# Patient Record
Sex: Female | Born: 1945 | Race: White | Hispanic: No | Marital: Married | Smoking: Never smoker
Health system: Southern US, Community
[De-identification: ages and names within clinical notes are randomized; demographics above are authoritative.]

## PROBLEM LIST (undated history)

## (undated) DIAGNOSIS — IMO0002 Reserved for concepts with insufficient information to code with codable children: Secondary | ICD-10-CM

## (undated) DIAGNOSIS — I10 Essential (primary) hypertension: Secondary | ICD-10-CM

## (undated) DIAGNOSIS — F329 Major depressive disorder, single episode, unspecified: Secondary | ICD-10-CM

## (undated) DIAGNOSIS — R87620 Atypical squamous cells of undetermined significance on cytologic smear of vagina (ASC-US): Secondary | ICD-10-CM

## (undated) DIAGNOSIS — R87629 Unspecified abnormal cytological findings in specimens from vagina: Secondary | ICD-10-CM

## (undated) DIAGNOSIS — R87619 Unspecified abnormal cytological findings in specimens from cervix uteri: Secondary | ICD-10-CM

## (undated) DIAGNOSIS — F419 Anxiety disorder, unspecified: Secondary | ICD-10-CM

## (undated) DIAGNOSIS — N189 Chronic kidney disease, unspecified: Secondary | ICD-10-CM

## (undated) DIAGNOSIS — F32A Depression, unspecified: Secondary | ICD-10-CM

## (undated) DIAGNOSIS — R030 Elevated blood-pressure reading, without diagnosis of hypertension: Secondary | ICD-10-CM

## (undated) HISTORY — DX: Atypical squamous cells of undetermined significance on cytologic smear of vagina (ASC-US): R87.620

## (undated) HISTORY — DX: Anxiety disorder, unspecified: F41.9

## (undated) HISTORY — DX: Reserved for concepts with insufficient information to code with codable children: IMO0002

## (undated) HISTORY — PX: ABDOMINAL HYSTERECTOMY: SHX81

## (undated) HISTORY — DX: Unspecified abnormal cytological findings in specimens from cervix uteri: R87.619

## (undated) HISTORY — PX: TUBAL LIGATION: SHX77

## (undated) HISTORY — DX: Unspecified abnormal cytological findings in specimens from vagina: R87.629

## (undated) HISTORY — DX: Elevated blood-pressure reading, without diagnosis of hypertension: R03.0

## (undated) HISTORY — DX: Major depressive disorder, single episode, unspecified: F32.9

## (undated) HISTORY — DX: Depression, unspecified: F32.A

## (undated) HISTORY — DX: Chronic kidney disease, unspecified: N18.9

---

## 2010-11-08 ENCOUNTER — Ambulatory Visit: Payer: Self-pay | Admitting: Family Medicine

## 2010-12-20 ENCOUNTER — Other Ambulatory Visit (HOSPITAL_COMMUNITY)
Admission: RE | Admit: 2010-12-20 | Discharge: 2010-12-20 | Disposition: A | Discharge status: Home / Self Care | Payer: PRIVATE HEALTH INSURANCE | Source: Ambulatory Visit | Attending: Family Medicine | Admitting: Family Medicine

## 2010-12-20 ENCOUNTER — Encounter: Payer: Self-pay | Admitting: Family Medicine

## 2010-12-20 ENCOUNTER — Ambulatory Visit (INDEPENDENT_AMBULATORY_CARE_PROVIDER_SITE_OTHER): Payer: PRIVATE HEALTH INSURANCE | Admitting: Family Medicine

## 2010-12-20 DIAGNOSIS — R87620 Atypical squamous cells of undetermined significance on cytologic smear of vagina (ASC-US): Secondary | ICD-10-CM

## 2010-12-20 DIAGNOSIS — Z0000 Encounter for general adult medical examination without abnormal findings: Secondary | ICD-10-CM

## 2010-12-20 DIAGNOSIS — Z01419 Encounter for gynecological examination (general) (routine) without abnormal findings: Secondary | ICD-10-CM | POA: Insufficient documentation

## 2010-12-20 DIAGNOSIS — Z136 Encounter for screening for cardiovascular disorders: Secondary | ICD-10-CM

## 2010-12-20 DIAGNOSIS — R87810 Cervical high risk human papillomavirus (HPV) DNA test positive: Secondary | ICD-10-CM | POA: Insufficient documentation

## 2010-12-20 DIAGNOSIS — R030 Elevated blood-pressure reading, without diagnosis of hypertension: Secondary | ICD-10-CM

## 2010-12-20 DIAGNOSIS — Z124 Encounter for screening for malignant neoplasm of cervix: Secondary | ICD-10-CM

## 2010-12-20 DIAGNOSIS — Z23 Encounter for immunization: Secondary | ICD-10-CM

## 2010-12-20 DIAGNOSIS — Z1231 Encounter for screening mammogram for malignant neoplasm of breast: Secondary | ICD-10-CM

## 2010-12-20 HISTORY — DX: Atypical squamous cells of undetermined significance on cytologic smear of vagina (ASC-US): R87.620

## 2010-12-20 LAB — BASIC METABOLIC PANEL
CO2: 27 mEq/L (ref 19–32)
Chloride: 110 mEq/L (ref 96–112)
Creatinine, Ser: 0.8 mg/dL (ref 0.4–1.2)
Sodium: 143 mEq/L (ref 135–145)

## 2010-12-20 LAB — LIPID PANEL
Cholesterol: 178 mg/dL (ref 0–200)
HDL: 42.6 mg/dL (ref >39.00)
LDL Cholesterol: 118 mg/dL — ABNORMAL HIGH (ref 0–99)
Triglycerides: 86 mg/dL (ref 0.0–149.0)

## 2010-12-20 NOTE — Progress Notes (Signed)
Subjective:    Patient ID: Amanda Hancock, female    DOB: March 16, 1946, 65 y.o.   MRN: [id number]  HPI  65 yo here to establish care and for welcome to medicare physical.  I have personally reviewed the Medicare Annual Wellness questionnaire and have noted 1. The patient's medical and social history 2. Their use of alcohol, tobacco or illicit drugs 3. Their current medications and supplements 4. The patient's functional ability including ADL's, fall risks, home safety risks and hearing or visual             impairment. 5. Diet and physical activities 6. Evidence for depression or mood disorders  Elevated blood pressure- has had elevated readings in past. Was previously on diltiazem.  No CP, SOB.  Patient Active Problem List  Diagnoses  . Elevated blood pressure (not hypertension)   Past Medical History  Diagnosis Date  . Elevated blood pressure (not hypertension)    Past Surgical History  Procedure Date  . Abdominal hysterectomy    History  Substance Use Topics  . Smoking status: Never Smoker   . Smokeless tobacco: Not on file  . Alcohol Use: Not on file   Family History  Problem Relation Age of Onset  . Parkinsonism Mother   . Dementia Mother    Allergies  Allergen Reactions  . Codeine Nausea And Vomiting and Other (See Comments)    headache   Current outpatient prescriptions:Naproxen Sodium (ALEVE) 220 MG CAPS, Take 1 capsule by mouth as needed.  , Disp: , Rfl:    Review of Systems See HPI   Objective:   Physical Exam BP 150/80  Pulse 70  Temp(Src) 98.4 F (36.9 C) (Oral)  Ht 5' 1.25" (1.556 m)  Wt 196 lb 4 oz (89.018 kg)  BMI 36.78 kg/m2  General:  Well-developed,well-nourished,in no acute distress; alert,appropriate and cooperative throughout examination Head:  normocephalic and atraumatic.   Eyes:  vision grossly intact, pupils equal, pupils round, and pupils reactive to light.   Ears:  R ear normal and L ear normal.   Nose:  no external  deformity.   Mouth:  good dentition.   Neck:  No deformities, masses, or tenderness noted. Breasts:  No mass, nodules, thickening, tenderness, bulging, retraction, inflamation, nipple discharge or skin changes noted.   Lungs:  Normal respiratory effort, chest expands symmetrically. Lungs are clear to auscultation, no crackles or wheezes. Heart:  Normal rate and regular rhythm. S1 and S2 normal without gallop, murmur, click, rub or other extra sounds. Abdomen:  Bowel sounds positive,abdomen soft and non-tender without masses, organomegaly or hernias noted. Rectal:  no external abnormalities.   Genitalia:  Pelvic Exam:        External: multiple fluid bumps on labia majora bilaterally- per pt have been there for as long as she can remember        Vagina: normal without lesions or masses        Cervix: absent        Adnexa: normal bimanual exam without masses or fullness        Uterus: absent        Pap smear: performed Msk:  No deformity or scoliosis noted of thoracic or lumbar spine.   Extremities:  No clubbing, cyanosis, edema, or deformity noted with normal full range of motion of all joints.   Neurologic:  alert & oriented X3 and gait normal.   Skin:  Intact without suspicious lesions or rashes Cervical Nodes:  No lymphadenopathy noted Axillary Nodes:  No palpable lymphadenopathy Psych:  Cognition and judgment appear intact. Alert and cooperative with normal attention span and concentration. No apparent delusions, illusions, hallucinations     EKG- probable normal variant, NSR with borderline short PR interval Assessment & Plan:   1. Elevated blood pressure (not hypertension)   Deteriorated. Asymptomatic. Advised to follow up in 1-2 weeks to recheck her blood pressure.   2. Routine general medical examination at a health care facility   The patients weight, height, BMI and visual acuity have been recorded in the chart I have made referrals, counseling and provided education to  the patient based review of the above and I have provided the pt with a written personalized care plan for preventive services.  IFOB, Mammogram ordered today. Zostavax and Pneumovax given.

## 2010-12-20 NOTE — Progress Notes (Signed)
Addended by: Gilmer Mor on: 12/20/2010 12:10 PM   Modules accepted: Orders

## 2010-12-20 NOTE — Patient Instructions (Signed)
Nice to meet you. Please stop by to see Shirlee Limerick on your way out. I would like for you to come in 1-2 weeks to recheck you blood pressure.

## 2010-12-26 ENCOUNTER — Other Ambulatory Visit: Payer: Self-pay | Admitting: Family Medicine

## 2011-01-03 ENCOUNTER — Encounter: Payer: Self-pay | Admitting: Family Medicine

## 2011-01-03 ENCOUNTER — Ambulatory Visit (INDEPENDENT_AMBULATORY_CARE_PROVIDER_SITE_OTHER): Payer: PRIVATE HEALTH INSURANCE | Admitting: Family Medicine

## 2011-01-03 VITALS — BP 158/76 | HR 76 | Temp 98.1°F | Ht 61.25 in | Wt 197.5 lb

## 2011-01-03 DIAGNOSIS — R030 Elevated blood-pressure reading, without diagnosis of hypertension: Secondary | ICD-10-CM

## 2011-01-03 MED ORDER — HYDROCHLOROTHIAZIDE 12.5 MG PO CAPS: 12.5000 mg | ORAL_CAPSULE | Freq: Every day | ORAL | Status: DC

## 2011-01-03 NOTE — Patient Instructions (Signed)
Great to see you. Please make an appointment in 2-4 weeks. Once you buy a home blood pressure machine, please check your blood pressure daily and keep a record for me.

## 2011-01-03 NOTE — Progress Notes (Signed)
  Subjective:    Patient ID: Amanda Hancock, female    DOB: 1945/10/15, 65 y.o.   MRN: [id number]  HPI  65 yo here for follow up elevated BP.  BP Readings from Last 3 Encounters:  01/03/11 158/76  12/20/10 150/80   Was previously on diltiazem.  No CP, SOB. Known know allergies to antihypertensives.  Lab Results  Component Value Date   CREATININE 0.8 12/20/2010     Patient Active Problem List  Diagnoses  . Elevated blood pressure (not hypertension)  . Routine general medical examination at a health care facility   Past Medical History  Diagnosis Date  . Elevated blood pressure (not hypertension)    Past Surgical History  Procedure Date  . Abdominal hysterectomy    History  Substance Use Topics  . Smoking status: Never Smoker   . Smokeless tobacco: Not on file  . Alcohol Use: Not on file   Family History  Problem Relation Age of Onset  . Parkinsonism Mother   . Dementia Mother    Allergies  Allergen Reactions  . Codeine Nausea And Vomiting and Other (See Comments)    headache   Current outpatient prescriptions:Naproxen Sodium (ALEVE) 220 MG CAPS, Take 1 capsule by mouth as needed.  , Disp: , Rfl:    Review of Systems See HPI   Objective:   Physical Exam BP 158/76  Pulse 76  Temp(Src) 98.1 F (36.7 C) (Oral)  Ht 5' 1.25" (1.556 m)  Wt 197 lb 8 oz (89.585 kg)  BMI 37.01 kg/m2 General:  Well-developed,well-nourished,in no acute distress; alert,appropriate and cooperative throughout examination Head:  normocephalic and atraumatic.   Eyes:  vision grossly intact, pupils equal, pupils round, and pupils reactive to light.   Ears:  R ear normal and L ear normal.   Nose:  no external deformity.   Mouth:  good dentition.   Lungs:  Normal respiratory effort, chest expands symmetrically. Lungs are clear to auscultation, no crackles or wheezes. Heart:  Normal rate and regular rhythm. S1 and S2 normal without gallop, murmur, click, rub or other extra  sounds. Msk:  No deformity or scoliosis noted of thoracic or lumbar spine.   Extremities:  No clubbing, cyanosis, edema, or deformity noted with normal full range of motion of all joints.   Neurologic:  alert & oriented X3 and gait normal.   Skin:  Intact without suspicious lesions or rashes Cervical Nodes:  No lymphadenopathy noted Axillary Nodes:  No palpable lymphadenopathy Psych:  Cognition and judgment appear intact. Alert and cooperative with normal attention span and concentration. No apparent delusions, illusions, hallucinations     Assessment & Plan:   1. Elevated blood pressure (not hypertension)   Deteriorated. Asymptomatic but endorses increased stressors at home. Will start low dose HCTZ 12.5 mg daily. Follow up in 2-4 weeks. Encouraged to buy a home cuff. The patient indicates understanding of these issues and agrees with the plan.  Advised to follow up in 1-2 weeks to recheck her blood pressure.

## 2011-01-07 ENCOUNTER — Encounter: Payer: Self-pay | Admitting: Obstetrics & Gynecology

## 2011-01-07 ENCOUNTER — Ambulatory Visit (INDEPENDENT_AMBULATORY_CARE_PROVIDER_SITE_OTHER): Payer: PRIVATE HEALTH INSURANCE | Admitting: Obstetrics & Gynecology

## 2011-01-07 VITALS — BP 156/82 | HR 69 | Ht 61.26 in | Wt 192.0 lb

## 2011-01-07 DIAGNOSIS — R87620 Atypical squamous cells of undetermined significance on cytologic smear of vagina (ASC-US): Secondary | ICD-10-CM

## 2011-01-07 NOTE — Progress Notes (Signed)
  Colposcopy Procedure Note  Indications: Pap smear done on 12/20/10 showed: ASCUS with POSITIVE high risk HPV.  Prior cervical/vaginal disease: patient reports having abnormal pap smears several years ago, prior to having a TAH.  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 Prior cervical treatment: no treatment.  Procedure Details  The risks and benefits of the procedure and Written informed consent obtained.  Speculum placed in vagina and excellent visualization of vaginal cuff achieved, vaginal cuff swabbed x 3 with Lugol's solution.  Findings: Cervix: Absent Vaginal inspection: lesion(s) noted at 6 o'clock and biopsies taken at 6 o'clock. Vulvar colposcopy: vulvar colposcopy not performed.  Specimens: Vaginal cuff biopsy  Complications: none.  Plan: Specimens labelled and sent to Pathology. Will base further treatment on Pathology findings. Post biopsy instructions given to patient. Return to discuss Pathology results in 2 weeks;it is likely benign.  Will probably need vaginal cuff pap smears every 6 months until she has two consecutive normal pap smears, then she can resume annual screening.

## 2011-01-07 NOTE — Patient Instructions (Signed)

## 2011-01-08 ENCOUNTER — Ambulatory Visit: Payer: Self-pay | Admitting: Family Medicine

## 2011-01-10 ENCOUNTER — Ambulatory Visit (INDEPENDENT_AMBULATORY_CARE_PROVIDER_SITE_OTHER): Payer: PRIVATE HEALTH INSURANCE | Admitting: Family Medicine

## 2011-01-10 ENCOUNTER — Encounter: Payer: Self-pay | Admitting: Family Medicine

## 2011-01-10 DIAGNOSIS — J069 Acute upper respiratory infection, unspecified: Secondary | ICD-10-CM

## 2011-01-10 MED ORDER — AMOXICILLIN 500 MG PO CAPS: 500.0000 mg | ORAL_CAPSULE | Freq: Two times a day (BID) | ORAL | Status: AC

## 2011-01-10 NOTE — Patient Instructions (Signed)
Take antibiotic as directed.  Drink lots of fluids.  Treat sympotmatically with Mucinex, nasal saline irrigation, and Tylenol/Ibuprofen.Call if not improving as expected in 5-7 days.    

## 2011-01-10 NOTE — Progress Notes (Signed)
SUBJECTIVE:  Amanda Hancock is a 65 y.o. female who complains of coryza, congestion, sneezing, sore throat and swollen glands for 12 days. She denies a history of anorexia, chest pain, dizziness, fatigue, fevers and myalgias and denies a history of asthma. Patient denies smoke cigarettes.   Patient Active Problem List  Diagnoses  . Elevated blood pressure (not hypertension)  . Routine general medical examination at a health care facility  . Vaginal Pap smear with ASC-US and positive HPV   Past Medical History  Diagnosis Date  . Elevated blood pressure (not hypertension)   . Abnormal Pap smear     ASCUS  . Chronic kidney disease     KIDNEYSTONES  . Vaginal Pap smear with ASC-US and positive HPV 12/20/2010   Past Surgical History  Procedure Date  . Abdominal hysterectomy   . Tubal ligation    History  Substance Use Topics  . Smoking status: Never Smoker   . Smokeless tobacco: Not on file  . Alcohol Use: Not on file   Family History  Problem Relation Age of Onset  . Parkinsonism Mother   . Dementia Mother   . Heart attack Paternal Grandfather   . Diabetes Maternal Grandmother    Allergies  Allergen Reactions  . Codeine Nausea And Vomiting and Other (See Comments)    headache   Current Outpatient Prescriptions on File Prior to Visit  Medication Sig Dispense Refill  . hydrochlorothiazide (MICROZIDE) 12.5 MG capsule Take 1 capsule (12.5 mg total) by mouth daily.  30 capsule  1  . Naproxen Sodium (ALEVE) 220 MG CAPS Take 1 capsule by mouth as needed.         The PMH, PSH, Social History, Family History, Medications, and allergies have been reviewed in [facility], and have been updated if relevant.  OBJECTIVE: BP 122/72  Pulse 74  Temp(Src) 98.3 F (36.8 C) (Oral)  Wt 192 lb (87.091 kg)  She appears well, vital signs are as noted. Ears normal.  Throat and pharynx normal.  Neck supple. No adenopathy in the neck. Nose is congested. Sinuses non tender. The chest is clear, without  wheezes or rales.  ASSESSMENT:  sinusitis  PLAN: Given duration and progression of symptoms, will treat for bacterial sinusitis with amoxicillin. Symptomatic therapy suggested: push fluids, rest and return office visit prn if symptoms persist or worsen. Call or return to clinic prn if these symptoms worsen or fail to improve as anticipated.

## 2011-01-28 ENCOUNTER — Encounter: Payer: Self-pay | Admitting: Family Medicine

## 2011-01-28 ENCOUNTER — Ambulatory Visit (INDEPENDENT_AMBULATORY_CARE_PROVIDER_SITE_OTHER): Payer: PRIVATE HEALTH INSURANCE | Admitting: Family Medicine

## 2011-01-28 VITALS — BP 132/80 | HR 60 | Temp 98.6°F | Ht 61.25 in | Wt 195.5 lb

## 2011-01-28 DIAGNOSIS — I10 Essential (primary) hypertension: Secondary | ICD-10-CM | POA: Insufficient documentation

## 2011-01-28 NOTE — Progress Notes (Signed)
  Subjective:    Patient ID: Amanda Hancock, female    DOB: Jun 18, 1945, 65 y.o.   MRN: [id number]  HPI  65 yo here for follow HTN. Started on HCTZ 12.5 mg daily last month.  BP Readings from Last 3 Encounters:  01/28/11 132/80  01/10/11 122/72  01/07/11 156/82   Feels much better. No HA, blurred vision, SOB, LE edema or dizziness.   Lab Results  Component Value Date   CREATININE 0.8 12/20/2010     Patient Active Problem List  Diagnoses  . Elevated blood pressure (not hypertension)  . Routine general medical examination at a health care facility  . Vaginal Pap smear with ASC-US and positive HPV   Past Medical History  Diagnosis Date  . Elevated blood pressure (not hypertension)   . Abnormal Pap smear     ASCUS  . Chronic kidney disease     KIDNEYSTONES  . Vaginal Pap smear with ASC-US and positive HPV 12/20/2010   Past Surgical History  Procedure Date  . Abdominal hysterectomy   . Tubal ligation    History  Substance Use Topics  . Smoking status: Never Smoker   . Smokeless tobacco: Not on file  . Alcohol Use: Not on file   Family History  Problem Relation Age of Onset  . Parkinsonism Mother   . Dementia Mother   . Heart attack Paternal Grandfather   . Diabetes Maternal Grandmother    Allergies  Allergen Reactions  . Codeine Nausea And Vomiting and Other (See Comments)    headache   Current outpatient prescriptions:hydrochlorothiazide (MICROZIDE) 12.5 MG capsule, Take 1 capsule (12.5 mg total) by mouth daily., Disp: 30 capsule, Rfl: 1;  Naproxen Sodium (ALEVE) 220 MG CAPS, Take 1 capsule by mouth as needed.  , Disp: , Rfl:    Review of Systems See HPI   Objective:   Physical Exam BP 132/80  Pulse 60  Temp(Src) 98.6 F (37 C) (Oral)  Ht 5' 1.25" (1.556 m)  Wt 195 lb 8 oz (88.678 kg)  BMI 36.64 kg/m2  General:  Well-developed,well-nourished,in no acute distress; alert,appropriate and cooperative throughout examination Head:  normocephalic and  atraumatic.   Eyes:  vision grossly intact, pupils equal, pupils round, and pupils reactive to light.   Ears:  R ear normal and L ear normal.   Nose:  no external deformity.   Mouth:  good dentition.   Lungs:  Normal respiratory effort, chest expands symmetrically. Lungs are clear to auscultation, no crackles or wheezes. Heart:  Normal rate and regular rhythm. S1 and S2 normal without gallop, murmur, click, rub or other extra sounds. Msk:  No deformity or scoliosis noted of thoracic or lumbar spine.   Extremities:  No clubbing, cyanosis, edema, or deformity noted with normal full range of motion of all joints.   Neurologic:  alert & oriented X3 and gait normal.   Skin:  Intact without suspicious lesions or rashes Cervical Nodes:  No lymphadenopathy noted Axillary Nodes:  No palpable lymphadenopathy Psych:  Cognition and judgment appear intact. Alert and cooperative with normal attention span and concentration. No apparent delusions, illusions, hallucinations     Assessment & Plan:    1. HTN (hypertension)    Improved. Continue HCTZ 12.5 mg daily. Follow up as needed.

## 2011-01-28 NOTE — Patient Instructions (Signed)
Good to see you. Continue taking your HCTZ every morning. Call me in 1-2 months with an update of your blood pressure.

## 2011-01-29 ENCOUNTER — Encounter: Payer: Self-pay | Admitting: Advanced Practice Midwife

## 2011-01-29 ENCOUNTER — Telehealth: Payer: Self-pay | Admitting: Radiology

## 2011-01-29 ENCOUNTER — Ambulatory Visit (INDEPENDENT_AMBULATORY_CARE_PROVIDER_SITE_OTHER): Payer: PRIVATE HEALTH INSURANCE | Admitting: Advanced Practice Midwife

## 2011-01-29 VITALS — BP 160/75 | HR 70 | Ht 61.25 in | Wt 194.0 lb

## 2011-01-29 DIAGNOSIS — B977 Papillomavirus as the cause of diseases classified elsewhere: Secondary | ICD-10-CM

## 2011-01-29 DIAGNOSIS — IMO0002 Reserved for concepts with insufficient information to code with codable children: Secondary | ICD-10-CM

## 2011-01-29 DIAGNOSIS — R87610 Atypical squamous cells of undetermined significance on cytologic smear of cervix (ASC-US): Secondary | ICD-10-CM

## 2011-01-29 DIAGNOSIS — R8789 Other abnormal findings in specimens from female genital organs: Secondary | ICD-10-CM

## 2011-01-29 NOTE — Telephone Encounter (Signed)
[facility] notified us that this patient never returned ifob test. 

## 2011-01-29 NOTE — Progress Notes (Signed)
  Subjective:    Patient ID: Amanda Hancock, female    DOB: [birth year], 65 y.o.   MRN: [id number]  HPI: Here for Bx results after ASCUS  Review of Systems: None significant     Objective:   Physical Exam: deferred  Acellular specimen.     Assessment & Plan:  Per Dr. Macon Large do not need to recollect specimen. Pap Q 6 months until 2 normal Pt info given.  Dorathy Kinsman 01/29/2011 8:53 AM

## 2011-01-29 NOTE — Patient Instructions (Signed)
Patient information: Management of atypical squamous cells (ASC-US and ASC-H) and low grade cervical squamous intraepithelial lesions (LSIL) (Beyond the Basics)  Authors Clydene Pugh, MD Thayer Ohm, MD Section Editor Alvera Novel, MD Deputy Editor Morton Amy, MD Disclosures  All topics are updated as new evidence becomes available and our peer review process is complete.  Literature review current through: Oct 2012.  This topic last updated: Jun 27, 2010.  INTRODUCTION - Squamous cells make up the outer layer of the cervix and vagina (picture 1). Atypical squamous cells (ASC) is the name given to squamous cells on a Pap smear or cervical cytology that do not have a normal appearance but are not clearly precancerous. Low grade squamous intraepithelial lesions (LSIL, also called low grade cervical intraepithelial neoplasia) refers to cells that appear slightly abnormal. Women who have ASC or LSIL require further testing because some women with these findings have a precancerous lesion of the cervix. This topic review discusses the management of women with ASC and LSIL. The management of women with high grade squamous intraepithelial lesions (HSIL) and atypical glandular cells (AGC) are discussed in a separate topic review. (See "Patient information: Management of high grade cervical squamous intraepithelial lesions (HSIL) and glandular abnormalities (AGC) (Beyond the Basics)".) ATYPICAL SQUAMOUS CELLS (ASC) - ASC is subdivided into atypical squamous cells of undetermined significance (ASC-US) and atypical squamous cells, cannot rule out a high grade lesion (ASC-H). The risk of a high-grade precancerous lesion in women with ASC-US is 15 percent and for those with ASC-H, the risk is 38 percent [1]. Atypical squamous cells of undetermined significance (ASC-US) - In women older than 20 years, there are three options for evaluation of a single ASC-US result. Women who are pregnant or  younger than age 10 years are evaluated differently (see 'Adolescents' below and 'Pregnant women' below). Perform HPV testing. This is the preferred follow up for ASC-US. HPV testing is often done at the same time as the Pap smear. This is convenient because the woman does not have to return for a second visit. HPV testing is described in detail in a separate topic review (see "Patient information: Cervical cancer screening (Beyond the Basics)").  Women who test positive for HPV types that are high risk for cervical cancer should have colposcopy because they are at greater risk of having an underlying precancerous lesion.  Women who test negative for HPV are not likely to have cervical precancer. These women should have a repeat Pap smear in one year. In most cases, the ASC-US resolves during this time.  Repeat the Pap smear in six months. If this test is normal, it is repeated once more after another six months until there have been two normal tests in a row; the woman can then return to routine screening. If the woman has a second ASC-US result or a more severe abnormality develops, colposcopy is recommended. (See 'Colposcopy' below.)  Have colposcopy. (See 'Colposcopy' below.) Atypical squamous cells, cannot rule out a high grade lesion (ASC-H) - ASC-H is more likely than ASC-US to be caused by a precancerous change. This finding requires further evaluation with colposcopy (see 'Colposcopy' below). LOW-GRADE SQUAMOUS LESION (LSIL) - LSIL is usually caused by mild cellular changes. Further testing with colposcopy and cervical biopsy is almost always recommended for women with LSIL because 12 to 16 percent of women with LSIL have a precancerous lesion [2,3]. However, adolescents and postmenopausal women are evaluated somewhat differently (see 'Adolescents' below and 'Postmenopausal women' below). Pregnant women  are evaluated similarly to non-pregnant women but are also discussed separately below. The  management of women with LSIL depends upon what is seen with colposcopy and biopsy (see 'Management after colposcopy' below); most clinicians will delay biopsy until after delivery in pregnant women (see 'Pregnant women' below). COLPOSCOPY - Colposcopy is an office procedure that allows a clinician to closely examine the cervix. It is commonly performed after an abnormal Pap smear. Colposcopy is performed similar to a pelvic examination, while the woman lies on an exam table. A speculum is used to view the cervix, and the viewing device (called a colposcope) remains outside the woman's body (picture 1). The colposcope magnifies the appearance of the cervix. This allows the clinician to better see the location and size of any abnormalities, and also to see any changes in the capillaries (small blood vessels) on the surface of the cervix.  During colposcopy, a small piece of the abnormal area can be removed (biopsied). Anesthesia (numbing medicine) is not needed because the biopsy causes only mild discomfort or cramping. Some women also need to have a biopsy of the inner cervix during colposcopy; this is called endocervical curettage (ECC). Endocervix refers to the inner cervix and curettage means scraping. Pregnant women should not have ECC because it may disturb the pregnancy. Management after colposcopy - Most women who have a colposcopy have a biopsy of any abnormal-appearing areas. The biopsy samples are sent to a pathologist who determines if there is any evidence of precancerous changes, termed cervical intraepithelial neoplasia (CIN). These changes are categorized as being mild (CIN 1) or moderate to severe (CIN 2 or 3). CIN 1 biopsy in women with Pap smear results that were ASC-US, ASC-H or LSIL cytology - Follow-up is recommended with either HPV testing at 12 months or a Pap smear at 6 and 12 months. The reason for this recommendation is that CIN I is a minor abnormality that usually goes away over  time without treatment. Waiting and repeating testing allows time for the abnormality to resolve and also enables the healthcare provider to identify the few situations in which the abnormality has become more severe. Repeat colposcopy is recommended if the results of the follow-up Pap smear are ASC or greater or if the HPV test is positive. Women with two consecutive negative repeat cytology results or a negative HPV test can resume routine screening.  CIN biopsy in women with Pap smear results that were high-grade SIL (HSIL) or atypical glandular cells-not otherwise specified - Follow-up can be one of three options: (1) Pap smear and colposcopy every six months for a year; (2) re-review of both Pap smear and biopsy results by a pathologist; or (3) a procedure to remove a larger piece of tissue from the cervix (cone biopsy or loop electrosurgical excision procedure [called LEEP, loop, or LLETZ]).  CIN 2 or 3 - CIN 2 or 3 is usually treated by removing or destroying the abnormal area (using a cone biopsy, LEEP, laser, or freezing procedure). The reason for this recommendation is that moderate to severe precancerous abnormalities (CIN 2 or 3) are unlikely to resolve over time without treatment and may progress to cancer if left untreated over a period of years. (See "Patient information: Treatment of precancerous cells of the cervix (Beyond the Basics)".) However, adolescents and pregnant women are often able to delay treatment (see 'Adolescents' below and 'Pregnant women' below). SPECIAL CIRCUMSTANCES Postmenopausal women - In postmenopausal women, LSIL may be evaluated differently because thinning and drying of the  tissues (referred to as atrophy) can cause the cells to appear abnormal. These changes often resolve with time and are often not related to changes caused by HPV. Options for postmenopausal women with LSIL include the following: Colposcopy  HPV testing  Repeat Pap smear at 6 and 12 months If the  HPV testing or repeat Pap smear tests are negative, the woman may return to routine testing. If the HPV test or repeat Pap smear are abnormal (ASC or greater), colposcopy is recommended. The management of postmenopausal women after colposcopy is discussed above (see 'Management after colposcopy' above). Adolescents - In adolescent women (age 65 years or younger), abnormal Pap smear is often approached differently because, in this age group, there is a good chance that the abnormal area will resolve over time, without treatment. There is a high rate of HPV infection in this group, but a very low rate of cervical cancer. ASC-US, LSIL, and/or CIN 1 - Adolescents with ASC-US, LSIL, and/or CIN 1 are often advised to have repeat Pap smear in 12 months. HPV testing is not recommended because it is likely to be positive and would not affect the recommendation to repeat the test in 12 months. If the 12 month cytology shows ASC-US, ASC-H, or LSIL, the test is usually repeated 12 months later (at 24 months).  If the 12 month cytology shows HSIL or worse, the adolescent is usually advised to have colposcopy (see 'Colposcopy' above). If the 24 month test is abnormal (ASC-US or greater), the adolescent is usually advised to have colposcopy. If the 24 month test is normal, Pap smear is recommended once yearly. High grade lesions (CIN 2 or 3) - Adolescents with HSIL should undergo colposcopy. If cervical biopsy does NOT show HSIL, they can be followed with colposcopy and Pap smear every six months for two years. If cervical biopsy confirms HSIL, they can either be followed with Pap smear and colposcopy or HPV testing until they have had normal testing for one year. If these follow-up results are normal, they can resume routine screening. If follow-up testing shows abnormalities or the cervix cannot be fully evaluated, they will need further testing or removal of a part of the cervix (cone biopsy or LEEP). (See "Patient  information: Treatment of precancerous cells of the cervix (Beyond the Basics)".) Pregnant women - The evaluation and management of pregnant women is different from non-pregnant women because of the risk that trauma to the cervix could lead to preterm labor or delivery. ASC-US - Pregnant women with ASC-US and a positive HPV test may elect to have colposcopy during pregnancy or wait until at least six weeks after delivering their baby. The reason for this recommendation is that cervix appears somewhat different during pregnancy, which can make it difficult to determine if an area appears abnormal due to pregnancy or due to precancerous changes. In addition, most mild abnormalities resolve over time without treatment.  ASC-H - Pregnant women with ASC-H should have a colposcopy. This is because ASC-H is more likely than ASC-US to be caused by a precancerous change.  LSIL - Colposcopy is recommended for pregnant women with LSIL, similar to non-pregnant women.  WHERE TO GET MORE INFORMATION - Your healthcare provider is the best source of information for questions and concerns related to your medical problem.

## 2011-02-15 ENCOUNTER — Telehealth: Payer: Self-pay | Admitting: *Deleted

## 2011-02-15 DIAGNOSIS — N6310 Unspecified lump in the right breast, unspecified quadrant: Secondary | ICD-10-CM

## 2011-02-15 NOTE — Telephone Encounter (Signed)
I will place order for diagnostic mammogram. Please keep me posted.

## 2011-02-15 NOTE — Telephone Encounter (Signed)
Per Ste[facility] Dr[facility]- pt came in wanting mammo, she was c/o mass under arm. They called to inform you in case you wanted to see pt first or just order diagnostic mammo. They can get pt in early next week if you just want to order mammo.  I called pt for more info and she states that she found a new mass during self breast exam under her right arm by lymph nodes. She meant to discuss it at her last ov but she has had so much going on she forgot.     Pt and imaging center are aware you are out of office today and state this can wait for your return

## 2011-02-19 ENCOUNTER — Other Ambulatory Visit: Payer: Self-pay | Admitting: Family Medicine

## 2011-02-19 DIAGNOSIS — N6310 Unspecified lump in the right breast, unspecified quadrant: Secondary | ICD-10-CM

## 2011-02-21 ENCOUNTER — Encounter: Payer: Self-pay | Admitting: Family Medicine

## 2011-02-21 ENCOUNTER — Ambulatory Visit (INDEPENDENT_AMBULATORY_CARE_PROVIDER_SITE_OTHER): Payer: PRIVATE HEALTH INSURANCE | Admitting: Family Medicine

## 2011-02-21 ENCOUNTER — Ambulatory Visit: Payer: Self-pay | Admitting: Family Medicine

## 2011-02-21 VITALS — BP 170/80 | HR 68 | Temp 97.7°F | Ht 61.5 in | Wt 199.8 lb

## 2011-02-21 DIAGNOSIS — N63 Unspecified lump in unspecified breast: Secondary | ICD-10-CM

## 2011-02-21 DIAGNOSIS — N6310 Unspecified lump in the right breast, unspecified quadrant: Secondary | ICD-10-CM

## 2011-02-21 DIAGNOSIS — I10 Essential (primary) hypertension: Secondary | ICD-10-CM

## 2011-02-21 NOTE — Progress Notes (Signed)
Subjective:    Patient ID: Amanda Hancock, female    DOB: 1945-06-28, 65 y.o.   MRN: [id number]  HPI  65 yo here for right breast mass.  Noticed a painful area beneath right axilla several months ago. Hurts mainly when she turns or lifts things. At times thinks there is a mass there but sometimes cannot feel it. No warmth or redness. No changes in her nipples or discharge.   BP very elevated today.  Has not yet taken her medication and was anxious about breast mass. No HA, blurred vision, CP or SOB.  BP Readings from Last 3 Encounters:  02/21/11 170/80  01/29/11 160/75  01/28/11 132/80       Patient Active Problem List  Diagnoses  . Elevated blood pressure (not hypertension)  . Routine general medical examination at a health care facility  . 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 HTN (hypertension)  . Breast mass, right   Past Medical History  Diagnosis Date  . Elevated blood pressure (not hypertension)   . Abnormal Pap smear     ASCUS  . Chronic kidney disease     KIDNEYSTONES  . Vaginal Pap smear with ASC-US and positive HPV 12/20/2010   Past Surgical History  Procedure Date  . Abdominal hysterectomy   . Tubal ligation    History  Substance Use Topics  . Smoking status: Never Smoker   . Smokeless tobacco: Not on file  . Alcohol Use: Not on file   Family History  Problem Relation Age of Onset  . Parkinsonism Mother   . Dementia Mother   . Heart attack Paternal Grandfather   . Diabetes Maternal Grandmother    Allergies  Allergen Reactions  . Codeine Nausea And Vomiting and Other (See Comments)    headache   Current outpatient prescriptions:hydrochlorothiazide (MICROZIDE) 12.5 MG capsule, Take 1 capsule (12.5 mg total) by mouth daily., Disp: 30 capsule, Rfl: 1;  Naproxen Sodium (ALEVE) 220 MG CAPS, Take 1 capsule by mouth as needed.  , Disp: , Rfl:    Review of Systems See HPI   No fevers, chills, night sweats. Objective:   Physical  Exam BP 170/80  Pulse 68  Temp(Src) 97.7 F (36.5 C) (Oral)  Ht 5' 1.5" (1.562 m)  Wt 199 lb 12 oz (90.606 kg)  BMI 37.13 kg/m2  General:  Well-developed,well-nourished,in no acute distress; alert,appropriate and cooperative throughout examination Head:  normocephalic and atraumatic.   Eyes:  vision grossly intact, pupils equal, pupils round, and pupils reactive to light.   Ears:  R ear normal and L ear normal.   Nose:  no external deformity.   Mouth:  good dentition.   Breasts:  ?palpable mass under right axilla- possible normal breast tissue but is tender to touch.  No other masses or nipple discharge or changes. Lungs:  Normal respiratory effort, chest expands symmetrically. Lungs are clear to auscultation, no crackles or wheezes. Heart:  Normal rate and regular rhythm. S1 and S2 normal without gallop, murmur, click, rub or other extra sounds. Msk:  No deformity or scoliosis noted of thoracic or lumbar spine.   Extremities:  No clubbing, cyanosis, edema, or deformity noted with normal full range of motion of all joints.   Neurologic:  alert & oriented X3 and gait normal.   Skin:  Intact without suspicious lesions or rashes Cervical Nodes:  No lymphadenopathy noted Axillary Nodes:  No palpable lymphadenopathy Psych:  Cognition and judgment appear intact. Alert and cooperative  with normal attention span and concentration. No apparent delusions, illusions, hallucinations     Assessment & Plan:    1. Breast mass, right  MM Digital Diagnostic Bilat   Will order diagnostic mammogram with ultrasound to evaluate further. The patient indicates understanding of these issues and agrees with the plan.  2. HTN (hypertension)     Deteriorated- has not taken her medication today. Asymptomatic, advised to take it as soon as she gets home, check her BP 1/2 hour later and call us if it has not come down. The patient indicates understanding of these issues and agrees with the plan.

## 2011-02-21 NOTE — Patient Instructions (Signed)
Good to see you. Please stop by to see Amanda Hancock and we will schedule your mammogram as soon as possible.

## 2011-02-22 ENCOUNTER — Encounter: Payer: Self-pay | Admitting: Family Medicine

## 2011-03-13 ENCOUNTER — Other Ambulatory Visit: Payer: Self-pay | Admitting: Family Medicine

## 2011-03-27 ENCOUNTER — Telehealth: Payer: Self-pay | Admitting: Radiology

## 2011-03-27 NOTE — Telephone Encounter (Signed)
[facility] notified us that the patient never returned her ifob kit. I called the patient, she did not do it. I told her we would document that information

## 2011-07-03 ENCOUNTER — Ambulatory Visit (INDEPENDENT_AMBULATORY_CARE_PROVIDER_SITE_OTHER): Payer: PRIVATE HEALTH INSURANCE | Admitting: Obstetrics & Gynecology

## 2011-07-03 ENCOUNTER — Other Ambulatory Visit (HOSPITAL_COMMUNITY)
Admission: RE | Admit: 2011-07-03 | Discharge: 2011-07-03 | Disposition: A | Discharge status: Home / Self Care | Payer: Medicare Other | Source: Ambulatory Visit | Attending: Obstetrics & Gynecology | Admitting: Obstetrics & Gynecology

## 2011-07-03 VITALS — BP 168/90 | Ht 61.0 in | Wt 208.0 lb

## 2011-07-03 DIAGNOSIS — Z124 Encounter for screening for malignant neoplasm of cervix: Secondary | ICD-10-CM | POA: Insufficient documentation

## 2011-07-03 DIAGNOSIS — R87620 Atypical squamous cells of undetermined significance on cytologic smear of vagina (ASC-US): Secondary | ICD-10-CM

## 2011-07-03 NOTE — Progress Notes (Signed)
Addended by: Barbara Cower on: 07/03/2011 02:53 PM   Modules accepted: Orders

## 2011-07-03 NOTE — Patient Instructions (Signed)
Return to clinic for any scheduled appointments or for any gynecologic concerns as needed.   

## 2011-07-03 NOTE — Progress Notes (Signed)
History:  66 y.o. F here today for repeat vaginal cuff pap smear; she had an ASCUS +HRHPV pap smear in 12/2010 and follow up biopsy and pathology unfortunately revealed an acellular specimen.  Of note, her hysterectomy was done for benign reasons.  She has no other concerns.  The following portions of the patient's history were reviewed and updated as appropriate: allergies, current medications, past family history, past medical history, past social history, past surgical history and problem list.  Review of Systems:  A comprehensive review of systems was negative.  Objective:  Physical Exam Blood pressure 168/90, height 5\' 1"  (1.549 m), weight 208 lb (94.348 kg). Gen: NAD Abd: Soft, nontender and nondistended Pelvic: Normal appearing external genitalia; normal appearing vaginal cuff, pap obtained.  Normal discharge.    Assessment & Plan:  Will follow up pap smear.  For her cervical dysplasia, patient needs pap smears every six months until she has two consecutive normal pap smears, then she can resume annual screening as needed. If any pap smear is abnormal, she will need repeat colposcopy and appropriate evaluation. Given that the hysterectomy was done for benign reasons; she truly does not need cervical cancer screening, but given that it was done and we had an abnormal result, we have to follow it up.  Once patient gets normal pap smears for about 2 years, will consider discontinuing screening.

## 2011-12-26 ENCOUNTER — Ambulatory Visit (INDEPENDENT_AMBULATORY_CARE_PROVIDER_SITE_OTHER): Payer: Medicare Other | Admitting: Family Medicine

## 2011-12-26 ENCOUNTER — Encounter: Payer: Self-pay | Admitting: Family Medicine

## 2011-12-26 ENCOUNTER — Other Ambulatory Visit (HOSPITAL_COMMUNITY)
Admission: RE | Admit: 2011-12-26 | Discharge: 2011-12-26 | Disposition: A | Discharge status: Home / Self Care | Payer: Medicare Other | Source: Ambulatory Visit | Attending: Family Medicine | Admitting: Family Medicine

## 2011-12-26 VITALS — BP 160/68 | HR 72 | Temp 98.0°F | Wt 205.0 lb

## 2011-12-26 DIAGNOSIS — Z1151 Encounter for screening for human papillomavirus (HPV): Secondary | ICD-10-CM | POA: Insufficient documentation

## 2011-12-26 DIAGNOSIS — Z01419 Encounter for gynecological examination (general) (routine) without abnormal findings: Secondary | ICD-10-CM | POA: Insufficient documentation

## 2011-12-26 DIAGNOSIS — R87620 Atypical squamous cells of undetermined significance on cytologic smear of vagina (ASC-US): Secondary | ICD-10-CM

## 2011-12-26 DIAGNOSIS — R87810 Cervical high risk human papillomavirus (HPV) DNA test positive: Secondary | ICD-10-CM | POA: Insufficient documentation

## 2011-12-26 NOTE — Addendum Note (Signed)
Addended by: Eliezer Bottom on: 12/26/2011 07:41 AM   Modules accepted: Orders

## 2011-12-26 NOTE — Progress Notes (Signed)
History:  66 y.o. F here today for repeat vaginal cuff pap smear. Has been followed by GYN-had an ASCUS +HRHPV pap smear in 12/2010 and follow up biopsy and pathology unfortunately revealed an acellular specimen.  Of note, her hysterectomy was done for benign reasons.    Last vaginal cuff pap smear was done by GYN in 06/2011- negative.    Advised to repeat in 6 months so she is here to today to have it repeated.    BP elevated but nervous about today's appointment. No HA, CP, SOB.    Patient Active Problem List  Diagnosis  . Elevated blood pressure (not hypertension)  . Routine general medical examination at a health care facility  . 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 HTN (hypertension)  . Breast mass, right   Past Medical History  Diagnosis Date  . Elevated blood pressure (not hypertension)   . Abnormal Pap smear     ASCUS  . Chronic kidney disease     KIDNEYSTONES  . Vaginal Pap smear with ASC-US and positive HPV 12/20/2010   Past Surgical History  Procedure Date  . Abdominal hysterectomy   . Tubal ligation    History  Substance Use Topics  . Smoking status: Never Smoker   . Smokeless tobacco: Not on file  . Alcohol Use: Not on file   Family History  Problem Relation Age of Onset  . Parkinsonism Mother   . Dementia Mother   . Heart attack Paternal Grandfather   . Diabetes Maternal Grandmother    Allergies  Allergen Reactions  . Codeine Nausea And Vomiting and Other (See Comments)    headache   Current Outpatient Prescriptions on File Prior to Visit  Medication Sig Dispense Refill  . hydrochlorothiazide (MICROZIDE) 12.5 MG capsule TAKE ONE CAPSULE BY MOUTH EVERY DAY  30 capsule  12  . Naproxen Sodium (ALEVE) 220 MG CAPS Take 1 capsule by mouth as needed.           The following portions of the patient's history were reviewed and updated as appropriate: allergies, current medications, past family history, past medical history, past social  history, past surgical history and problem list.  Review of Systems:  A comprehensive review of systems was negative.  Objective:  Physical Exam BP 160/68  Pulse 72  Temp 98 F (36.7 C)  Wt 205 lb (92.987 kg)  Gen: NAD Abd: Soft, nontender and nondistended Pelvic: Normal appearing external genitalia; normal appearing vaginal cuff, pap obtained.  Normal discharge.    Assessment & Plan:   1. Vaginal Pap smear with ASC-US and positive HPV    Pap smear repeated today. Per GYN recs, for her cervical dysplasia, patient needs pap smears every six months until she has two consecutive normal pap smears, then she can resume annual screening as needed.  Therefore, if pap smear normal today, she can resume annual screening.   If any pap smear is abnormal, she will need repeat colposcopy and appropriate evaluation by GYN.  Since she did have hysterectomy for benign reasons, per GYN recs, once she has normal yearly pap smears for two years, she may be able to discontinue screening.

## 2012-01-23 ENCOUNTER — Ambulatory Visit (INDEPENDENT_AMBULATORY_CARE_PROVIDER_SITE_OTHER): Payer: Medicare Other | Admitting: Obstetrics & Gynecology

## 2012-01-23 ENCOUNTER — Encounter: Payer: Self-pay | Admitting: Obstetrics & Gynecology

## 2012-01-23 VITALS — BP 169/85 | HR 80 | Ht 61.0 in | Wt 204.0 lb

## 2012-01-23 DIAGNOSIS — R87620 Atypical squamous cells of undetermined significance on cytologic smear of vagina (ASC-US): Secondary | ICD-10-CM

## 2012-01-23 NOTE — Patient Instructions (Addendum)
Colposcopy Care After Colposcopy is a procedure in which a special tool is used to magnify the surface of the the concerning tissue. A tissue sample (biopsy) may also be taken. This sample will be looked at for cancer or other problems. After the test:  You may have some cramping.  Lie down for a few minutes if you feel lightheaded.  You may have some bleeding which should stop in a few days. You may also have abnormal vaginal discharge for a few days HOME CARE  Do not have sex or use tampons for 2 to 3 days or as told.  Only take medicine as told by your doctor.  Continue to take your birth control pills as usual. Finding out the results of your test Ask when your test results will be ready. Make sure you get your test results. GET HELP RIGHT AWAY IF:  You are bleeding a lot or are passing blood clots.  You develop a fever of 102 F (38.9 C) or higher.  You have abnormal vaginal discharge that is persistent for more than a few days.  You have cramps that do not go away with medicine.  You feel lightheaded, dizzy, or pass out (faint). MAKE SURE YOU:   Understand these instructions.  Will watch your condition.  Will get help right away if you are not doing well or get worse. Document Released: 08/21/2007 Document Revised: 05/27/2011 Document Reviewed: 08/21/2007 [facility] Patient Information 2013 [place], [place].

## 2012-01-23 NOTE — Progress Notes (Signed)
History:   66 y.o. F here today for repeat colposcopy of her vaginal cuff.  .She had an ASCUS +HRHPV pap smear in 12/20/10 and follow up colposcopy biopsy and pathology unfortunately revealed an acellular specimen.  On 07/03/11 she had a negative pap smear, but she had another ASCUS +HRHPV pap smear in 12/26/11 prompting repeat colposcopy evaluation.  She has no other gynecologic concerns.   Of note, her hysterectomy was done for benign reasons.  Given that her hysterectomy was done for benign reasons; she truly does not need pap smear screening, but given that it was done and we had an abnormal result, we had to follow it up.  The following portions of the patient's history were reviewed and updated as appropriate: allergies, current medications, past family history, past medical history, past social history, past surgical history and problem list.  Normal mammogram in 02/2011.  Review of Systems:   A comprehensive review of systems was negative.   Objective:   Physical Exam  Blood pressure 169/85, pulse 80, height 5\' 1"  (1.549 m), weight 204 lb (92.534 kg). Gen: NAD  Abd: Soft, nontender and nondistended  Pelvic: Normal appearing external genitalia; normal appearing vaginal cuff. Normal discharge.   COLPOSCOPY PROCEDURE Patient given informed consent, signed physical copy will be scanned into EPIC, time out was performed.  Placed in lithotomy position. Cervix viewed with speculum and colposcope after application of Lugol's solution. There was an area on the vaginal cuff around 12 o'clock with decreased Lugol's uptake, two biopsies were taken of this area.   Specimen was labelled and sent to pathology.   Assessment & Plan:   Patient was given post procedure instructions.  Will follow up pathology and manage accordingly; she will probably need co-testing with pap and HPV testing in one year.   Once patient gets normal pap smears for about 2 years, will consider discontinuing screening.  Routine  preventative health maintenance measures emphasized.

## 2012-01-27 ENCOUNTER — Encounter: Payer: Self-pay | Admitting: Obstetrics & Gynecology

## 2012-01-30 ENCOUNTER — Other Ambulatory Visit: Payer: Self-pay | Admitting: Family Medicine

## 2012-01-30 DIAGNOSIS — Z136 Encounter for screening for cardiovascular disorders: Secondary | ICD-10-CM

## 2012-01-30 DIAGNOSIS — R030 Elevated blood-pressure reading, without diagnosis of hypertension: Secondary | ICD-10-CM

## 2012-01-30 DIAGNOSIS — Z0000 Encounter for general adult medical examination without abnormal findings: Secondary | ICD-10-CM

## 2012-02-04 ENCOUNTER — Ambulatory Visit: Payer: Medicare Other | Admitting: Obstetrics & Gynecology

## 2012-02-11 ENCOUNTER — Other Ambulatory Visit: Payer: Medicare Other

## 2012-02-11 ENCOUNTER — Other Ambulatory Visit (INDEPENDENT_AMBULATORY_CARE_PROVIDER_SITE_OTHER): Payer: Medicare Other

## 2012-02-11 DIAGNOSIS — Z136 Encounter for screening for cardiovascular disorders: Secondary | ICD-10-CM

## 2012-02-11 DIAGNOSIS — Z0000 Encounter for general adult medical examination without abnormal findings: Secondary | ICD-10-CM

## 2012-02-11 LAB — LIPID PANEL
LDL Cholesterol: 105 mg/dL — ABNORMAL HIGH (ref 0–99)
Total CHOL/HDL Ratio: 5

## 2012-02-11 LAB — COMPREHENSIVE METABOLIC PANEL
AST: 18 U/L (ref 0–37)
Albumin: 3.9 g/dL (ref 3.5–5.2)
Alkaline Phosphatase: 87 U/L (ref 39–117)
Glucose, Bld: 113 mg/dL — ABNORMAL HIGH (ref 70–99)
Potassium: 3.2 mEq/L — ABNORMAL LOW (ref 3.5–5.1)
Sodium: 140 mEq/L (ref 135–145)
Total Protein: 7.2 g/dL (ref 6.0–8.3)

## 2012-02-18 ENCOUNTER — Ambulatory Visit (INDEPENDENT_AMBULATORY_CARE_PROVIDER_SITE_OTHER): Payer: Medicare Other | Admitting: Family Medicine

## 2012-02-18 ENCOUNTER — Encounter: Payer: Self-pay | Admitting: Family Medicine

## 2012-02-18 VITALS — BP 150/82 | HR 80 | Temp 98.0°F | Ht 61.5 in | Wt 205.0 lb

## 2012-02-18 DIAGNOSIS — Z1211 Encounter for screening for malignant neoplasm of colon: Secondary | ICD-10-CM

## 2012-02-18 DIAGNOSIS — Z1231 Encounter for screening mammogram for malignant neoplasm of breast: Secondary | ICD-10-CM

## 2012-02-18 DIAGNOSIS — R030 Elevated blood-pressure reading, without diagnosis of hypertension: Secondary | ICD-10-CM

## 2012-02-18 DIAGNOSIS — I10 Essential (primary) hypertension: Secondary | ICD-10-CM

## 2012-02-18 DIAGNOSIS — Z0000 Encounter for general adult medical examination without abnormal findings: Secondary | ICD-10-CM

## 2012-02-18 DIAGNOSIS — R87620 Atypical squamous cells of undetermined significance on cytologic smear of vagina (ASC-US): Secondary | ICD-10-CM

## 2012-02-18 MED ORDER — LISINOPRIL 10 MG PO TABS: 10.0000 mg | ORAL_TABLET | Freq: Every day | ORAL | Status: DC

## 2012-02-18 NOTE — Patient Instructions (Addendum)
STOP taking your hydrochlorothiazide. We are starting lisinopril 10 mg daily. Please come back to see me in 2 weeks.    Please stop by to see Shirlee Limerick on your way out to set up your mammogram.  Please call a dermatologist at your convenience.  Hang in there.  Keep in touch about your husband.

## 2012-02-18 NOTE — Progress Notes (Signed)
Subjective:    Patient ID: Amanda Hancock, female    DOB: 1946-01-22, 66 y.o.   MRN: [id number]  HPI  66 yo here for AMWv.  I have personally reviewed the Medicare Annual Wellness questionnaire and have noted 1. The patient's medical and social history 2. Their use of alcohol, tobacco or illicit drugs 3. Their current medications and supplements 4. The patient's functional ability including ADL's, fall risks, home safety risks and hearing or visual             impairment. 5. Diet and physical activities 6. Evidence for depression or mood disorders  Elevated blood pressure-  On HCTZ 12.5 mg daily. No CP, SOB.  She has noticed an itchy, splotchy rash on her arm that she usually gets right after she takes her medication.  She has not taken her medication today. No wheezing.  Husband just got diagnosed with prostate cancer- they are looking into treatment options.  She feels she is coping with this ok.  Colposcopy last month- benign with recommended repeat pap smear in 1 year.    Patient Active Problem List  Diagnosis  . Elevated blood pressure (not hypertension)  . Routine general medical examination at a health care facility  . 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 HTN (hypertension)  . Breast mass, right   Past Medical History  Diagnosis Date  . Elevated blood pressure (not hypertension)   . Abnormal Pap smear     ASCUS  . Chronic kidney disease     KIDNEYSTONES  . Vaginal Pap smear with ASC-US and positive HPV 12/20/2010   Past Surgical History  Procedure Date  . Abdominal hysterectomy   . Tubal ligation    History  Substance Use Topics  . Smoking status: Never Smoker   . Smokeless tobacco: Not on file  . Alcohol Use: Not on file   Family History  Problem Relation Age of Onset  . Parkinsonism Mother   . Dementia Mother   . Heart attack Paternal Grandfather   . Diabetes Maternal Grandmother    Allergies  Allergen Reactions  . Codeine Nausea  And Vomiting and Other (See Comments)    headache   Current outpatient prescriptions:hydrochlorothiazide (MICROZIDE) 12.5 MG capsule, TAKE ONE CAPSULE BY MOUTH EVERY DAY, Disp: 30 capsule, Rfl: 12   Review of Systems See HPI   Objective:   Physical Exam BP 150/82  Pulse 80  Temp 98 F (36.7 C)  Ht 5' 1.5" (1.562 m)  Wt 205 lb (92.987 kg)  BMI 38.11 kg/m2  General:  Well-developed,well-nourished,in no acute distress; alert,appropriate and cooperative throughout examination Head:  normocephalic and atraumatic.   Eyes:  vision grossly intact, pupils equal, pupils round, and pupils reactive to light.   Ears:  R ear normal and L ear normal.   Nose:  no external deformity.   Mouth:  good dentition.   Neck:  No deformities, masses, or tenderness noted. Breasts:  No mass, nodules, thickening, tenderness, bulging, retraction, inflamation, nipple discharge or skin changes noted.   Lungs:  Normal respiratory effort, chest expands symmetrically. Lungs are clear to auscultation, no crackles or wheezes. Heart:  Normal rate and regular rhythm. S1 and S2 normal without gallop, murmur, click, rub or other extra sounds. Abdomen:  Bowel sounds positive,abdomen soft and non-tender without masses, organomegaly or hernias noted. Msk:  No deformity or scoliosis noted of thoracic or lumbar spine.   Extremities:  No clubbing, cyanosis, edema, or deformity noted with  normal full range of motion of all joints.   Neurologic:  alert & oriented X3 and gait normal.   Skin:  Intact without suspicious lesions or rashes Psych:  Cognition and judgment appear intact. Alert and cooperative with normal attention span and concentration. No apparent delusions, illusions, hallucinations      Assessment & Plan:   1. HTN Deteriorated and likely allergic to HCTZ (add HCTZ to allergy list). Start Lisinopril 10 mg daily. Follow up in 2 weeks.   2. Routine general medical examination at a health care facility   The  patients weight, height, BMI and visual acuity have been recorded in the chart I have made referrals, counseling and provided education to the patient based review of the above and I have provided the pt with a written personalized care plan for preventive services.  IFOB, Mammogram ordered today.

## 2012-03-09 ENCOUNTER — Ambulatory Visit: Payer: Self-pay | Admitting: Family Medicine

## 2012-03-09 ENCOUNTER — Encounter: Payer: Self-pay | Admitting: Family Medicine

## 2012-03-12 ENCOUNTER — Ambulatory Visit: Payer: Self-pay | Admitting: Family Medicine

## 2012-03-12 ENCOUNTER — Encounter: Payer: Self-pay | Admitting: Family Medicine

## 2012-03-16 ENCOUNTER — Other Ambulatory Visit: Payer: Self-pay | Admitting: Family Medicine

## 2012-03-16 DIAGNOSIS — R922 Inconclusive mammogram: Secondary | ICD-10-CM

## 2012-03-16 DIAGNOSIS — R9230 Dense breasts, unspecified: Secondary | ICD-10-CM

## 2012-05-29 ENCOUNTER — Encounter: Payer: Self-pay | Admitting: Family Medicine

## 2012-05-29 ENCOUNTER — Ambulatory Visit (INDEPENDENT_AMBULATORY_CARE_PROVIDER_SITE_OTHER): Payer: Medicare Other | Admitting: Family Medicine

## 2012-05-29 VITALS — BP 180/94 | HR 72 | Temp 98.3°F

## 2012-05-29 DIAGNOSIS — J019 Acute sinusitis, unspecified: Secondary | ICD-10-CM | POA: Insufficient documentation

## 2012-05-29 MED ORDER — HYDROCODONE-HOMATROPINE 5-1.5 MG/5ML PO SYRP: 5.0000 mL | ORAL_SOLUTION | Freq: Every evening | ORAL | Status: DC | PRN

## 2012-05-29 MED ORDER — AZITHROMYCIN 250 MG PO TABS: ORAL_TABLET | ORAL | Status: DC

## 2012-05-29 MED ORDER — LISINOPRIL 10 MG PO TABS: 10.0000 mg | ORAL_TABLET | Freq: Every day | ORAL | Status: DC

## 2012-05-29 NOTE — Patient Instructions (Addendum)
I think you have sinusitis along with bronchitis. Treat with zpack. Use hydrocodone cough medicine at night time as needed for sleep. Push fluids and plenty of rest. May continue guaifenesin but avoid any decongestant. Your blood pressure is too high!  Keep eye on it at store and persistently staying elevated please return to see [place]. Regardless return to see Dr. Dayton Martes in next few weeks to monitor blood pressure.

## 2012-05-29 NOTE — Assessment & Plan Note (Signed)
With bronchitis.  Treat aggressively with zpack given husband immunocompromised at home. See pt instructions for further recs.

## 2012-05-29 NOTE — Progress Notes (Signed)
  Subjective:    Patient ID: Amanda Hancock, female    DOB: [birth year], 67 y.o.   MRN: [id number]  HPI CC: cough  Sick for 6 days.  Temperature to 100-101, feels significant sinus congestion.  Body aches, sore ribs from coughing.  Not wheezing or short of breath.  Some R earache.  + sinus pressure headache.  + ST and PNdrainage.  Significant cough productive of yellow mucous.  Rhinorrhea.  No abd pain, nausea, tooth pain.  BP very elevated today - states due to decongestant she started taking.  Husband, Amanda Hancock, has been sick recently.  Currently undergoing radiation therapy for prostate cancer. No smokers at home. No h/o asthma.  Past Medical History  Diagnosis Date  . Elevated blood pressure (not hypertension)   . Abnormal Pap smear     ASCUS  . Chronic kidney disease     KIDNEYSTONES  . Vaginal Pap smear with ASC-US and positive HPV 12/20/2010    Review of Systems Per HPI    Objective:   Physical Exam  Nursing note and vitals reviewed. Constitutional: She appears well-developed and well-nourished. No distress.  HENT:  Head: Normocephalic and atraumatic.  Right Ear: Hearing, tympanic membrane, external ear and ear canal normal.  Left Ear: Hearing, tympanic membrane, external ear and ear canal normal.  Nose: Mucosal edema present. No rhinorrhea. Right sinus exhibits maxillary sinus tenderness (++) and frontal sinus tenderness (+). Left sinus exhibits maxillary sinus tenderness (++) and frontal sinus tenderness (+).  Mouth/Throat: Uvula is midline and mucous membranes are normal. Posterior oropharyngeal edema and posterior oropharyngeal erythema present. No oropharyngeal exudate or tonsillar abscesses.  Eyes: Conjunctivae and EOM are normal. Pupils are equal, round, and reactive to light. No scleral icterus.  Neck: Normal range of motion. Neck supple.  Cardiovascular: Normal rate, regular rhythm, normal heart sounds and intact distal pulses.   No murmur  heard. Pulmonary/Chest: Effort normal and breath sounds normal. No respiratory distress. She has no wheezes. She has no rales.  Harsh hacking cough  Lymphadenopathy:    She has no cervical adenopathy.  Skin: Skin is warm and dry. No rash noted.       Assessment & Plan:

## 2012-05-29 NOTE — Assessment & Plan Note (Signed)
Very elevated today.  Anticipate due to acute illness.  Has been running elevated last few visits Advised to monitor at home and f/u with PCP in next few weeks as due.  Pt agrees with plan.  BP Readings from Last 3 Encounters:  05/29/12 180/94  02/18/12 150/82  01/23/12 169/85

## 2012-06-30 ENCOUNTER — Encounter: Payer: Self-pay | Admitting: Family Medicine

## 2012-06-30 ENCOUNTER — Ambulatory Visit: Payer: Medicare Other | Admitting: Family Medicine

## 2012-06-30 ENCOUNTER — Ambulatory Visit (INDEPENDENT_AMBULATORY_CARE_PROVIDER_SITE_OTHER): Payer: Medicare Other | Admitting: Family Medicine

## 2012-06-30 VITALS — BP 180/80 | HR 88 | Temp 98.3°F | Wt 203.0 lb

## 2012-06-30 DIAGNOSIS — I10 Essential (primary) hypertension: Secondary | ICD-10-CM

## 2012-06-30 DIAGNOSIS — F4323 Adjustment disorder with mixed anxiety and depressed mood: Secondary | ICD-10-CM

## 2012-06-30 MED ORDER — SERTRALINE HCL 25 MG PO TABS: 25.0000 mg | ORAL_TABLET | Freq: Every day | ORAL | Status: DC

## 2012-06-30 MED ORDER — AMLODIPINE BESY-BENAZEPRIL HCL 5-20 MG PO CAPS: 1.0000 | ORAL_CAPSULE | Freq: Every day | ORAL | Status: DC

## 2012-06-30 NOTE — Patient Instructions (Addendum)
Good to see you. Let's start a baby aspirin (81 mg) daily.  Stop taking your Lisinopril.  Start taking your benazapril-amlodipine-1 tablet daily.  Please follow up with me in 2 weeks.

## 2012-06-30 NOTE — Progress Notes (Signed)
Subjective:    Patient ID: Amanda Hancock, female    DOB: 11-20-1945, 67 y.o.   MRN: [id number]  67 yo here to follow up HTN.    HTN- In December, we d/c'd her HCTZ due to  itchy, splotchy rash on her arm that she usually gets right after she took her medication.  Started Lisinopril 10 mg daily and was lost to follow up.  BP incredibly elevated today and she says that it has been this way for "awhile."  Has had some HA, no blurred vision.  BP Readings from Last 3 Encounters:  06/30/12 180/80  05/29/12 180/94  02/18/12 150/82   Under more stress. Husband is being treated for prostate CA.  Just started radiation and he is doing well but she worries about him.  Feels on edge and anxious.  More tearful than usual.  She is sleeping ok.  No SI or HI.  Appetite good.   Patient Active Problem List  Diagnosis  . Routine general medical examination at a health care facility  . 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 HTN (hypertension)  . Breast mass, right  . Acute sinusitis   Past Medical History  Diagnosis Date  . Elevated blood pressure (not hypertension)   . Abnormal Pap smear     ASCUS  . Chronic kidney disease     KIDNEYSTONES  . Vaginal Pap smear with ASC-US and positive HPV 12/20/2010   Past Surgical History  Procedure Laterality Date  . Abdominal hysterectomy    . Tubal ligation     History  Substance Use Topics  . Smoking status: Never Smoker   . Smokeless tobacco: Not on file  . Alcohol Use: No   Family History  Problem Relation Age of Onset  . Parkinsonism Mother   . Dementia Mother   . Heart attack Paternal Grandfather   . Diabetes Maternal Grandmother    Allergies  Allergen Reactions  . Codeine Nausea And Vomiting and Other (See Comments)    headache  . Hctz (Hydrochlorothiazide)     Rash    Current outpatient prescriptions:azithromycin (ZITHROMAX Z-PAK) 250 MG tablet, Two on day 1 followed by one daily for 4 days for total of 5 days, PO,  Disp: 6 each, Rfl: 0;  HYDROcodone-homatropine (HYCODAN) 5-1.5 MG/5ML syrup, Take 5 mLs by mouth at bedtime as needed for cough., Disp: 120 mL, Rfl: 0;  lisinopril (PRINIVIL,ZESTRIL) 10 MG tablet, Take 1 tablet (10 mg total) by mouth daily., Disp: 30 tablet, Rfl: 1   Review of Systems See HPI   Objective:   Physical Exam BP 180/80  Pulse 88  Temp(Src) 98.3 F (36.8 C)  Wt 203 lb (92.08 kg)  BMI 37.74 kg/m2  General:  Well-developed,well-nourished,in no acute distress; alert,appropriate and cooperative throughout examination Head:  normocephalic and atraumatic.   Eyes:  vision grossly intact, pupils equal, pupils round, and pupils reactive to light.   Ears:  R ear normal and L ear normal.   Nose:  no external deformity.   Mouth:  good dentition.   Neck:  No deformities, masses, or tenderness noted. Breasts:  No mass, nodules, thickening, tenderness, bulging, retraction, inflamation, nipple discharge or skin changes noted.   Lungs:  Normal respiratory effort, chest expands symmetrically. Lungs are clear to auscultation, no crackles or wheezes. Heart:  Normal rate and regular rhythm. S1 and S2 normal without gallop, murmur, click, rub or other extra sounds. Abdomen:  Bowel sounds positive,abdomen soft and non-tender without  masses, organomegaly or hernias noted. Msk:  No deformity or scoliosis noted of thoracic or lumbar spine.   Extremities:  No clubbing, cyanosis, edema, or deformity noted with normal full range of motion of all joints.   Neurologic:  alert & oriented X3 and gait normal.   Skin:  Intact without suspicious lesions or rashes Psych:  Cognition and judgment appear intact. Alert and cooperative with normal attention span and concentration. No apparent delusions, illusions, hallucinations      Assessment & Plan:   1. HTN (hypertension) Deteriorated. Likely worsened by anxiety. D/c lisinopril. Start benazapril 20-amlodipine 5 mg daily. Follow up in 2 weeks.  2.  Adjustment disorder with mixed anxiety and depressed mood New- start Zoloft 25 mg daily. She is to follow up in 2 weeks. The patient indicates understanding of these issues and agrees with the plan.

## 2012-07-14 ENCOUNTER — Ambulatory Visit (INDEPENDENT_AMBULATORY_CARE_PROVIDER_SITE_OTHER): Payer: Medicare Other | Admitting: Family Medicine

## 2012-07-14 ENCOUNTER — Encounter: Payer: Self-pay | Admitting: Family Medicine

## 2012-07-14 VITALS — BP 140/78 | HR 56 | Temp 97.8°F | Wt 198.0 lb

## 2012-07-14 DIAGNOSIS — F4323 Adjustment disorder with mixed anxiety and depressed mood: Secondary | ICD-10-CM

## 2012-07-14 DIAGNOSIS — I10 Essential (primary) hypertension: Secondary | ICD-10-CM

## 2012-07-14 NOTE — Patient Instructions (Addendum)
Good to see you. I hope your husband's surgery on the 9th goes well. Your blood pressure looks great. Just keep in touch.

## 2012-07-14 NOTE — Progress Notes (Signed)
Subjective:    Patient ID: Amanda Hancock, female    DOB: June 10, 1945, 67 y.o.   MRN: [id number]  67 yo pleasant female here for 2 week follow up.     HTN- In December, we d/c'd her HCTZ due to  itchy, splotchy rash on her arm that she usually gets right after she took her medication.  Started Lisinopril 10 mg daily and was lost to follow up until two weeks ago.  BP was very elevated and she was symptomatic with HA.    D/c'd  Lisinopril and started benazapril 20-amlodipine 5 mg daily.  BP much better controlled.  HA and dizziness have completely resolved.  No CP, SOB or LE edema.  Lab Results  Component Value Date   CREATININE 0.7 02/11/2012     BP Readings from Last 3 Encounters:  06/30/12 180/80  05/29/12 180/94  02/18/12 150/82   Adjustment disorder-  Under more stress. Husband is being treated for prostate CA.  Just started radiation and he is doing well but she worries about him.  Felt on edge and anxious.  More tearful than usual.  She is sleeping ok.  No SI or HI.  Appetite good. Started Zoloft 25 mg daily 2 weeks ago.   She can already feel a difference.  Less tearful, feels more relaxed.   Patient Active Problem List   Diagnosis Date Noted  . Adjustment disorder with mixed anxiety and depressed mood 06/30/2012  . Breast mass, right 02/21/2011  . HTN (hypertension) 01/28/2011  . Vaginal Pap smear with ASC-US and positive HPV 12/20/2010   Past Medical History  Diagnosis Date  . Elevated blood pressure (not hypertension)   . Abnormal Pap smear     ASCUS  . Chronic kidney disease     KIDNEYSTONES  . Vaginal Pap smear with ASC-US and positive HPV 12/20/2010   Past Surgical History  Procedure Laterality Date  . Abdominal hysterectomy    . Tubal ligation     History  Substance Use Topics  . Smoking status: Never Smoker   . Smokeless tobacco: Not on file  . Alcohol Use: No   Family History  Problem Relation Age of Onset  . Parkinsonism Mother   . Dementia  Mother   . Heart attack Paternal Grandfather   . Diabetes Maternal Grandmother    Allergies  Allergen Reactions  . Codeine Nausea And Vomiting and Other (See Comments)    headache  . Hctz (Hydrochlorothiazide)     Rash    Current outpatient prescriptions:amLODipine-benazepril (LOTREL) 5-20 MG per capsule, Take 1 capsule by mouth daily., Disp: 30 capsule, Rfl: 0;  sertraline (ZOLOFT) 25 MG tablet, Take 1 tablet (25 mg total) by mouth daily., Disp: 30 tablet, Rfl: 1   Review of Systems See HPI   Objective:   Physical Exam BP 140/78  Pulse 56  Temp(Src) 97.8 F (36.6 C)  Wt 198 lb (89.812 kg)  BMI 36.81 kg/m2  General:  Well-developed,well-nourished,in no acute distress; alert,appropriate and cooperative throughout examination Head:  normocephalic and atraumatic.   Eyes:  vision grossly intact, pupils equal, pupils round, and pupils reactive to light.   Ears:  R ear normal and L ear normal.   Nose:  no external deformity.   Mouth:  good dentition.   Neck:  No deformities, masses, or tenderness noted. Breasts:  No mass, nodules, thickening, tenderness, bulging, retraction, inflamation, nipple discharge or skin changes noted.   Lungs:  Normal respiratory effort, chest expands symmetrically. Lungs are  clear to auscultation, no crackles or wheezes. Heart:  Normal rate and regular rhythm. S1 and S2 normal without gallop, murmur, click, rub or other extra sounds. Abdomen:  Bowel sounds positive,abdomen soft and non-tender without masses, organomegaly or hernias noted. No abdominal bruit Msk:  No deformity or scoliosis noted of thoracic or lumbar spine.   Extremities:  No clubbing, cyanosis, edema, or deformity noted with normal full range of motion of all joints.   Neurologic:  alert & oriented X3 and gait normal.   Skin:  Intact without suspicious lesions or rashes Psych:  Cognition and judgment appear intact. Alert and cooperative with normal attention span and concentration. No  apparent delusions, illusions, hallucinations      Assessment & Plan:   1. HTN (hypertension) Improved.  Continue current dose of Lotrel. Follow up as needed.  2. Adjustment disorder with mixed anxiety and depressed mood  Improved with current dose of Zoloft.  No changes today but she will keep in touch.

## 2012-07-28 ENCOUNTER — Telehealth: Payer: Self-pay

## 2012-07-28 MED ORDER — AMLODIPINE BESY-BENAZEPRIL HCL 5-20 MG PO CAPS: 1.0000 | ORAL_CAPSULE | Freq: Every day | ORAL | Status: DC

## 2012-07-28 NOTE — Telephone Encounter (Signed)
Thanks for update. Ok to refill.

## 2012-07-28 NOTE — Telephone Encounter (Signed)
Pt request refill lotrel to [facility] [address]. Notified pt done. Pt wanted Dr Dayton Martes to know that her husband had radioactive seeds done and is doing well (Pts husband has prostate CA).

## 2012-08-12 ENCOUNTER — Telehealth: Payer: Self-pay | Admitting: *Deleted

## 2012-08-12 DIAGNOSIS — R928 Other abnormal and inconclusive findings on diagnostic imaging of breast: Secondary | ICD-10-CM

## 2012-08-12 NOTE — Telephone Encounter (Signed)
Pt has appt for 6 mo follow up left breast mammogram.  Order was put in back in 12/13, but it was for a biopsy.  Entered correct order, faxed to [place].

## 2012-09-14 ENCOUNTER — Ambulatory Visit: Payer: Self-pay | Admitting: Family Medicine

## 2012-09-15 ENCOUNTER — Encounter: Payer: Self-pay | Admitting: Family Medicine

## 2012-09-17 ENCOUNTER — Other Ambulatory Visit: Payer: Self-pay | Admitting: *Deleted

## 2012-09-17 MED ORDER — SERTRALINE HCL 25 MG PO TABS: 25.0000 mg | ORAL_TABLET | Freq: Every day | ORAL | Status: DC

## 2013-01-29 ENCOUNTER — Other Ambulatory Visit: Payer: Self-pay | Admitting: *Deleted

## 2013-01-29 MED ORDER — AMLODIPINE BESY-BENAZEPRIL HCL 5-20 MG PO CAPS: 1.0000 | ORAL_CAPSULE | Freq: Every day | ORAL | Status: DC

## 2013-01-29 MED ORDER — SERTRALINE HCL 25 MG PO TABS: 25.0000 mg | ORAL_TABLET | Freq: Every day | ORAL | Status: DC

## 2013-01-29 NOTE — Telephone Encounter (Signed)
Last office visit 07/14/2012.  Ok to refill? 

## 2013-03-16 ENCOUNTER — Other Ambulatory Visit (HOSPITAL_COMMUNITY)
Admission: RE | Admit: 2013-03-16 | Discharge: 2013-03-16 | Disposition: A | Discharge status: Home / Self Care | Payer: Medicare Other | Source: Ambulatory Visit | Attending: Family Medicine | Admitting: Family Medicine

## 2013-03-16 ENCOUNTER — Encounter: Payer: Self-pay | Admitting: Family Medicine

## 2013-03-16 ENCOUNTER — Ambulatory Visit (INDEPENDENT_AMBULATORY_CARE_PROVIDER_SITE_OTHER): Payer: Medicare Other | Admitting: Family Medicine

## 2013-03-16 VITALS — BP 140/84 | HR 71 | Temp 98.1°F | Ht 61.0 in | Wt 195.5 lb

## 2013-03-16 DIAGNOSIS — I10 Essential (primary) hypertension: Secondary | ICD-10-CM

## 2013-03-16 DIAGNOSIS — F4323 Adjustment disorder with mixed anxiety and depressed mood: Secondary | ICD-10-CM

## 2013-03-16 DIAGNOSIS — R87810 Cervical high risk human papillomavirus (HPV) DNA test positive: Secondary | ICD-10-CM | POA: Insufficient documentation

## 2013-03-16 DIAGNOSIS — R87622 Low grade squamous intraepithelial lesion on cytologic smear of vagina (LGSIL): Secondary | ICD-10-CM | POA: Insufficient documentation

## 2013-03-16 DIAGNOSIS — Z01419 Encounter for gynecological examination (general) (routine) without abnormal findings: Secondary | ICD-10-CM | POA: Insufficient documentation

## 2013-03-16 DIAGNOSIS — Z136 Encounter for screening for cardiovascular disorders: Secondary | ICD-10-CM

## 2013-03-16 DIAGNOSIS — Z1151 Encounter for screening for human papillomavirus (HPV): Secondary | ICD-10-CM | POA: Insufficient documentation

## 2013-03-16 DIAGNOSIS — Z0000 Encounter for general adult medical examination without abnormal findings: Secondary | ICD-10-CM

## 2013-03-16 DIAGNOSIS — R87620 Atypical squamous cells of undetermined significance on cytologic smear of vagina (ASC-US): Secondary | ICD-10-CM

## 2013-03-16 DIAGNOSIS — Z1211 Encounter for screening for malignant neoplasm of colon: Secondary | ICD-10-CM

## 2013-03-16 LAB — COMPREHENSIVE METABOLIC PANEL
ALT: 15 U/L (ref 0–35)
AST: 13 U/L (ref 0–37)
Albumin: 3.8 g/dL (ref 3.5–5.2)
Alkaline Phosphatase: 121 U/L — ABNORMAL HIGH (ref 39–117)
Calcium: 10.4 mg/dL (ref 8.4–10.5)
Chloride: 109 mEq/L (ref 96–112)
Glucose, Bld: 105 mg/dL — ABNORMAL HIGH (ref 70–99)
Sodium: 142 mEq/L (ref 135–145)
Total Bilirubin: 0.9 mg/dL (ref 0.3–1.2)
Total Protein: 6.7 g/dL (ref 6.0–8.3)

## 2013-03-16 LAB — LIPID PANEL
Cholesterol: 165 mg/dL (ref 0–200)
LDL Cholesterol: 115 mg/dL — ABNORMAL HIGH (ref 0–99)
Total CHOL/HDL Ratio: 4
VLDL: 12.2 mg/dL (ref 0.0–40.0)

## 2013-03-16 NOTE — Progress Notes (Signed)
Pre-visit discussion using our clinic review tool. No additional management support is needed unless otherwise documented below in the visit note.   Subjective:    Patient ID: Amanda Hancock, female    DOB: [birth year], 67 y.o.   MRN: [id number]  HPI  67 yo here for AMWv.  I have personally reviewed the Medicare Annual Wellness questionnaire and have noted 1. The patient's medical and social history 2. Their use of alcohol, tobacco or illicit drugs 3. Their current medications and supplements 4. The patient's functional ability including ADL's, fall risks, home safety risks and hearing or visual             impairment. 5. Diet and physical activities 6. Evidence for depression or mood disorders  Elevated blood pressure-  On Lotrel. No CP, SOB.  She has noticed an itchy, splotchy rash on her arm that she usually gets right after she takes her medication.  She has not taken her medication today. No wheezing.  Husband's prostate cancer in remission.  She feels she is coping with this ok.  Colposcopy in 01/2012- recommended pap with HPV testing in 1 year.  She did have this done yet.    Due for colonoscopy.  UTD on immunizations.  End of life wishes discussed and updated in Social History.  Patient Active Problem List   Diagnosis Date Noted  . Medicare annual wellness visit, subsequent 03/16/2013  . Adjustment disorder with mixed anxiety and depressed mood 06/30/2012  . Breast mass, right 02/21/2011  . HTN (hypertension) 01/28/2011  . Vaginal Pap smear with ASC-US and positive HPV 12/20/2010   Past Medical History  Diagnosis Date  . Elevated blood pressure (not hypertension)   . Abnormal Pap smear     ASCUS  . Chronic kidney disease     KIDNEYSTONES  . Vaginal Pap smear with ASC-US and positive HPV 12/20/2010   Past Surgical History  Procedure Laterality Date  . Abdominal hysterectomy    . Tubal ligation     History  Substance Use Topics  . Smoking status: Never  Smoker   . Smokeless tobacco: Not on file  . Alcohol Use: No   Family History  Problem Relation Age of Onset  . Parkinsonism Mother   . Dementia Mother   . Heart attack Paternal Grandfather   . Diabetes Maternal Grandmother    Allergies  Allergen Reactions  . Codeine Nausea And Vomiting and Other (See Comments)    headache  . Hctz [Hydrochlorothiazide]     Rash    Current outpatient prescriptions:amLODipine-benazepril (LOTREL) 5-20 MG per capsule, Take 1 capsule by mouth daily., Disp: 30 capsule, Rfl: 5;  aspirin 81 MG tablet, Take 81 mg by mouth daily., Disp: , Rfl: ;  sertraline (ZOLOFT) 25 MG tablet, Take 1 tablet (25 mg total) by mouth daily., Disp: 30 tablet, Rfl: 3   Review of Systems See HPI   Patient reports no  vision/ hearing changes,anorexia, weight change, fever ,adenopathy, persistant / recurrent hoarseness, swallowing issues, chest pain, edema,persistant / recurrent cough, hemoptysis, dyspnea(rest, exertional, paroxysmal nocturnal), gastrointestinal  bleeding (melena, rectal bleeding), abdominal pain, excessive heart burn, GU symptoms(dysuria, hematuria, pyuria, voiding/incontinence  Issues) syncope, focal weakness, severe memory loss, concerning skin lesions, depression, anxiety, abnormal bruising/bleeding, major joint swelling, breast masses or abnormal vaginal bleeding.    Objective:   Physical Exam BP 140/84  Pulse 71  Temp(Src) 98.1 F (36.7 C) (Oral)  Ht 5\' 1"  (1.549 m)  Wt 195 lb 8 oz (88.678 kg)  BMI 36.96 kg/m2  SpO2 93%  General:  Well-developed,well-nourished,in no acute distress; alert,appropriate and cooperative throughout examination Head:  normocephalic and atraumatic.   Eyes:  vision grossly intact, pupils equal, pupils round, and pupils reactive to light.   Ears:  R ear normal and L ear normal.   Nose:  no external deformity.   Mouth:  good dentition.   Neck:  No deformities, masses, or tenderness noted. Breasts:  No mass, nodules,  thickening, tenderness, bulging, retraction, inflamation, nipple discharge or skin changes noted.   Lungs:  Normal respiratory effort, chest expands symmetrically. Lungs are clear to auscultation, no crackles or wheezes. Heart:  Normal rate and regular rhythm. S1 and S2 normal without gallop, murmur, click, rub or other extra sounds. Abdomen:  Bowel sounds positive,abdomen soft and non-tender without masses, organomegaly or hernias noted. Rectal:  no external abnormalities.   Genitalia:  Pelvic Exam:        External: normal female genitalia without lesions or masses        Vagina: normal without lesions or masses         Uterus: absent        Pap smear: performed Msk:  No deformity or scoliosis noted of thoracic or lumbar spine.   Extremities:  No clubbing, cyanosis, edema, or deformity noted with normal full range of motion of all joints.   Neurologic:  alert & oriented X3 and gait normal.   Skin:  Intact without suspicious lesions or rashes Cervical Nodes:  No lymphadenopathy noted Axillary Nodes:  No palpable lymphadenopathy Psych:  Cognition and judgment appear intact. Alert and cooperative with normal attention span and concentration. No apparent delusions, illusions, hallucinations       Assessment & Plan:    1. Medicare annual wellness visit, subsequent .The patients weight, height, BMI and visual acuity have been recorded in the chart I have made referrals, counseling and provided education to the patient based review of the above and I have provided the pt with a written personalized care plan for preventive services.  - Comprehensive metabolic panel  2. HTN (hypertension) Well controlled.  No change in rx.  3. Adjustment disorder with mixed anxiety and depressed mood Doing well.  No changes.  4. Vaginal Pap smear with ASC-US and positive HPV Pap and HPV today. - Cytology - PAP [place]  5. Screening for ischemic heart disease  - Lipid Profile  6. Special  screening for malignant neoplasms, colon  - Fecal occult blood, imunochemical; Future

## 2013-03-16 NOTE — Patient Instructions (Signed)
Good to see you. We will call with your lab results.  Try Zyrtec for your drainage.  Happy New year.

## 2013-03-19 ENCOUNTER — Encounter: Payer: Self-pay | Admitting: *Deleted

## 2013-03-22 ENCOUNTER — Encounter: Payer: Self-pay | Admitting: Obstetrics & Gynecology

## 2013-03-24 ENCOUNTER — Telehealth: Payer: Self-pay | Admitting: *Deleted

## 2013-03-24 NOTE — Telephone Encounter (Signed)
I called patient to discuss her results and recommendations.  We will cancel the appointment she made here at our office and Darl PikesSusan will schedule her at the out patient clinic at the hospital so she can have a colposcopy done.  We will call patient back once this appointment is set up.

## 2013-03-24 NOTE — Telephone Encounter (Signed)
Message copied by Barbara CowerNOGUES, Gabby Rackers L on Wed Mar 24, 2013 11:14 AM ------      Message from: Jaynie CollinsANYANWU, UGONNA A      Created: Wed Mar 24, 2013  8:37 AM      Regarding: [place] vaginal [place]poscopy       Please call and inform her that she needs a repeat colposcopy for persistent abnormal vaginal cuff pap smears; [facility]            Thanks!            UAA             ----- Message -----         From: Dianne Dunalia M Aron, MD         Sent: 03/22/2013   7:29 AM           To: Tereso NewcomerUgonna A Anyanwu, MD, Desmond DikeWaynetta H Knight, CMA            Please let pt know that her pap smear again was abnormal. We were hoping she would finally have a negative one but it is still positive.       I do want to refer her back to her GYN.  I will route this to her GYN but please ask her to call her as well to make an appt.       ------

## 2013-03-25 ENCOUNTER — Ambulatory Visit: Payer: Medicare Other | Admitting: Obstetrics & Gynecology

## 2013-03-30 ENCOUNTER — Encounter: Payer: Self-pay | Admitting: *Deleted

## 2013-03-30 ENCOUNTER — Telehealth: Payer: Self-pay | Admitting: *Deleted

## 2013-03-30 ENCOUNTER — Other Ambulatory Visit (INDEPENDENT_AMBULATORY_CARE_PROVIDER_SITE_OTHER): Payer: Medicare HMO

## 2013-03-30 DIAGNOSIS — Z1211 Encounter for screening for malignant neoplasm of colon: Secondary | ICD-10-CM

## 2013-03-30 LAB — FECAL OCCULT BLOOD, IMMUNOCHEMICAL: FECAL OCCULT BLD: NEGATIVE

## 2013-03-30 NOTE — Telephone Encounter (Signed)
Called patient and informed of pap results and [facility] @ [facility].  Directions given.  Pt verbalizes understanding.

## 2013-04-15 ENCOUNTER — Telehealth: Payer: Self-pay | Admitting: *Deleted

## 2013-04-15 NOTE — Telephone Encounter (Signed)
Pt called nurse line requesting information on colposcopy appt on 04/23/13.    Spoke with patient and confirmed appt on 04/23/13 @ 0900 and directions given.  Pt has no other questions.

## 2013-04-23 ENCOUNTER — Encounter: Payer: Self-pay | Admitting: Obstetrics & Gynecology

## 2013-04-23 ENCOUNTER — Ambulatory Visit (INDEPENDENT_AMBULATORY_CARE_PROVIDER_SITE_OTHER): Payer: Medicare HMO | Admitting: Obstetrics & Gynecology

## 2013-04-23 VITALS — BP 151/71 | HR 76 | Ht 61.0 in | Wt 196.4 lb

## 2013-04-23 DIAGNOSIS — R87622 Low grade squamous intraepithelial lesion on cytologic smear of vagina (LGSIL): Secondary | ICD-10-CM

## 2013-04-23 NOTE — Patient Instructions (Signed)

## 2013-04-23 NOTE — Progress Notes (Signed)
Patient ID: Amanda Hancock, female   DOB: [birth year], 68 y.o.   MRN: [id number]  Chief Complaint  Patient presents with  . Colposcopy    HPI Amanda Hancock is a 68 y.o. female.  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 HPI  Indications: Pap smear on December 2014 showed: low-grade squamous intraepithelial neoplasia (LGSIL - encompassing HPV,mild dysplasia,CIN I). Previous colposcopy: normal exam without visible pathology and cuff bx. Prior cervical treatment: no treatment.  Past Medical History  Diagnosis Date  . Elevated blood pressure (not hypertension)   . Abnormal Pap smear     ASCUS  . Chronic kidney disease     KIDNEYSTONES  . Vaginal Pap smear with ASC-US and positive HPV 12/20/2010  . Vaginal Pap smear, abnormal     Past Surgical History  Procedure Laterality Date  . Abdominal hysterectomy    . Tubal ligation      Family History  Problem Relation Age of Onset  . Parkinsonism Mother   . Dementia Mother   . Heart attack Paternal Grandfather   . Diabetes Maternal Grandmother     Social History History  Substance Use Topics  . Smoking status: Never Smoker   . Smokeless tobacco: Not on file  . Alcohol Use: No    Allergies  Allergen Reactions  . Codeine Nausea And Vomiting and Other (See Comments)    headache  . Hctz [Hydrochlorothiazide]     Rash     Current Outpatient Prescriptions  Medication Sig Dispense Refill  . amLODipine-benazepril (LOTREL) 5-20 MG per capsule Take 1 capsule by mouth daily.  30 capsule  5  . aspirin 81 MG tablet Take 81 mg by mouth daily.      . sertraline (ZOLOFT) 25 MG tablet Take 1 tablet (25 mg total) by mouth daily.  30 tablet  3   No current facility-administered medications for this visit.    Review of Systems Review of Systems  Blood pressure 151/71, pulse 76, height 5\' 1"  (1.549 m), weight 196 lb 6.4 oz (89.086 kg).  Physical Exam Physical Exam  Data Reviewed Pap and  biopsies  Assessment    Procedure Details  The risks and benefits of the procedure and Written informed consent obtained.  Speculum placed in vagina and excellent visualization of cervix achieved, cervix swabbed x 3 with acetic acid solution. Lugols applied and no lesion or non staining area at cuff  Specimens: none  Complications: none.     Plan    Reassured that risk of vaginal ca is very low and paps may be unnecessary. Vaginal exam [facility] 1 year [facility] and consider not repeating paps.   Amanda Hancock 04/23/2013, 9:43 AM

## 2013-04-26 ENCOUNTER — Encounter: Payer: Self-pay | Admitting: *Deleted

## 2013-08-02 ENCOUNTER — Ambulatory Visit (INDEPENDENT_AMBULATORY_CARE_PROVIDER_SITE_OTHER): Payer: Medicare HMO | Admitting: Family Medicine

## 2013-08-02 ENCOUNTER — Encounter: Payer: Self-pay | Admitting: Family Medicine

## 2013-08-02 VITALS — BP 162/88 | HR 67 | Temp 98.3°F | Wt 203.5 lb

## 2013-08-02 DIAGNOSIS — R600 Localized edema: Secondary | ICD-10-CM | POA: Insufficient documentation

## 2013-08-02 DIAGNOSIS — R609 Edema, unspecified: Secondary | ICD-10-CM

## 2013-08-02 NOTE — Progress Notes (Signed)
   Subjective:   Patient ID: Amanda Hancock, female    DOB: 1945/04/18, 68 y.o.   MRN: [id number]  Amanda AvenaDiane M Natzke is a pleasant 68 y.o. year old female who presents to clinic today with Foot Swelling  on 08/02/2013  HPI: One week ago, was outside all day doing yard work and came inside and noticed right foot and ankle was swollen.  Elevated your foot- went away within hours.  No CP or SOB.  No redness or warmth.  Has never had anything like that before.  No known injury.  Patient Active Problem List   Diagnosis Date Noted  . Edema of right foot 08/02/2013  . Vaginal Pap smear with LGSIL, positive HRHPV Type 16 03/16/2013  . Medicare annual wellness visit, subsequent 03/16/2013  . Adjustment disorder with mixed anxiety and depressed mood 06/30/2012  . Breast mass, right 02/21/2011  . HTN (hypertension) 01/28/2011   Past Medical History  Diagnosis Date  . Elevated blood pressure (not hypertension)   . Abnormal Pap smear     ASCUS  . Chronic kidney disease     KIDNEYSTONES  . Vaginal Pap smear with ASC-US and positive HPV 12/20/2010  . Vaginal Pap smear, abnormal    Past Surgical History  Procedure Laterality Date  . Abdominal hysterectomy    . Tubal ligation     History  Substance Use Topics  . Smoking status: Never Smoker   . Smokeless tobacco: Not on file  . Alcohol Use: No   Family History  Problem Relation Age of Onset  . Parkinsonism Mother   . Dementia Mother   . Heart attack Paternal Grandfather   . Diabetes Maternal Grandmother    Allergies  Allergen Reactions  . Codeine Nausea And Vomiting and Other (See Comments)    headache  . Hctz [Hydrochlorothiazide]     Rash    Current Outpatient Prescriptions on File Prior to Visit  Medication Sig Dispense Refill  . amLODipine-benazepril (LOTREL) 5-20 MG per capsule Take 1 capsule by mouth daily.  30 capsule  5  . aspirin 81 MG tablet Take 81 mg by mouth daily.       No current facility-administered  medications on file prior to visit.   The PMH, PSH, Social History, Family History, Medications, and allergies have been reviewed [facility], and have been updated if relevant.   Review of Systems    See HPI No further welling Objective:    BP 162/88  Pulse 67  Temp(Src) 98.3 F (36.8 C) (Oral)  Wt 203 lb 8 oz (92.307 kg)  SpO2 97%   Physical Exam  Nursing note and vitals reviewed. Constitutional: She appears well-developed and well-nourished. No distress.  HENT:  Head: Normocephalic and atraumatic.  Musculoskeletal: She exhibits no edema and no tenderness.  Skin: Skin is warm and dry.  Psychiatric: She has a normal mood and affect. Her behavior is normal. Judgment and thought content normal.          Assessment & Plan:   Edema of right foot No Follow-up on file.

## 2013-08-02 NOTE — Assessment & Plan Note (Signed)
Reassurance provided. If symptoms return, she will call me.

## 2013-08-02 NOTE — Progress Notes (Signed)
Pre visit review using our clinic review tool, if applicable. No additional management support is needed unless otherwise documented below in the visit note. 

## 2013-08-03 ENCOUNTER — Telehealth: Payer: Self-pay

## 2013-08-03 DIAGNOSIS — R87622 Low grade squamous intraepithelial lesion on cytologic smear of vagina (LGSIL): Secondary | ICD-10-CM

## 2013-08-03 NOTE — Telephone Encounter (Addendum)
Pt wants to see [place] [facility] [place]. Pt wants Dr Dayton MartesAron "to check out Dr Valentino Saxonherry" to make sure Dr Dayton MartesAron approves of her. Pt is to have f/u pap smears.If Dr Dayton MartesAron approves of Dr Valentino Saxonherry pt request appt scheduled with Dr Valentino Saxonherry at [phone]. Pt request cb.

## 2013-08-04 NOTE — Telephone Encounter (Signed)
I am not familiar with this physician but I would be happy to place referral.

## 2013-08-13 ENCOUNTER — Other Ambulatory Visit: Payer: Self-pay

## 2013-08-13 MED ORDER — AMLODIPINE BESY-BENAZEPRIL HCL 5-20 MG PO CAPS: 1.0000 | ORAL_CAPSULE | Freq: Every day | ORAL | Status: DC

## 2013-08-30 ENCOUNTER — Ambulatory Visit: Payer: Self-pay | Admitting: Family Medicine

## 2013-08-30 ENCOUNTER — Encounter: Payer: Self-pay | Admitting: Family Medicine

## 2013-09-01 ENCOUNTER — Other Ambulatory Visit: Payer: Self-pay

## 2013-09-02 ENCOUNTER — Encounter: Payer: Self-pay | Admitting: Family Medicine

## 2013-10-04 ENCOUNTER — Encounter: Payer: Self-pay | Admitting: Family Medicine

## 2013-10-04 ENCOUNTER — Ambulatory Visit (INDEPENDENT_AMBULATORY_CARE_PROVIDER_SITE_OTHER): Payer: Medicare HMO | Admitting: Family Medicine

## 2013-10-04 VITALS — BP 136/84 | HR 68 | Temp 98.5°F | Wt 198.2 lb

## 2013-10-04 DIAGNOSIS — M898X9 Other specified disorders of bone, unspecified site: Secondary | ICD-10-CM

## 2013-10-04 DIAGNOSIS — M949 Disorder of cartilage, unspecified: Secondary | ICD-10-CM

## 2013-10-04 DIAGNOSIS — R5381 Other malaise: Secondary | ICD-10-CM | POA: Insufficient documentation

## 2013-10-04 DIAGNOSIS — R5383 Other fatigue: Secondary | ICD-10-CM

## 2013-10-04 DIAGNOSIS — M899 Disorder of bone, unspecified: Secondary | ICD-10-CM

## 2013-10-04 NOTE — Progress Notes (Signed)
Pre visit review using our clinic review tool, if applicable. No additional management support is needed unless otherwise documented below in the visit note. 

## 2013-10-04 NOTE — Assessment & Plan Note (Signed)
2 wk h/o malaise with headache and bone pain - longer than would be expected for uncomplicated viral illness. Check labwork today (CBC, CMP, ESR/CRP) and will call with results. Will need to eval today for calcium trouble, MM, blood dyscrasias, etc. Pt agrees with plan.

## 2013-10-04 NOTE — Patient Instructions (Signed)
Let's check blood work today. We will call you with results. In the meantime, start aleve 1 pill twice daily with food for next 5 days for symptoms. Also push fluids and plenty of rest.

## 2013-10-04 NOTE — Progress Notes (Addendum)
BP 136/84  Pulse 68  Temp(Src) 98.5 F (36.9 C) (Oral)  Wt 198 lb 4 oz (89.926 kg)  SpO2 97%   CC: malaise  Subjective:    Patient ID: Amanda Hancock, female    DOB: Apr 14, 1945, 68 y.o.   MRN: [id number]  HPI: Amanda Hancock is a 68 y.o. female presenting on 10/04/2013 for Fever   2wk h/o "bone aches like tooth ache" entire body head to toe, dull frontal headache, fever to 101 yesterday evening. Alleviated with laying down and staying still, worse with movement.  Denies cough, congestion, rhinorrhea, ear or tooth pain, sore throat, abd pain, nausea, new rashes, no tick exposure she knows of. No neck stiffness.  Self treated with ice pack with towel which helps temporarily. No sick contacts at home. No h/o smoking or asthma or COPD. Never had anything like this before. No recent mosquito bites that she knows of.  Lab Results  Component Value Date   CREATININE 0.9 03/16/2013    Relevant past medical, surgical, family and social history reviewed and updated as indicated.  Allergies and medications reviewed and updated. Current Outpatient Prescriptions on File Prior to Visit  Medication Sig  . amLODipine-benazepril (LOTREL) 5-20 MG per capsule Take 1 capsule by mouth daily.  Marland Kitchen aspirin 81 MG tablet Take 81 mg by mouth daily.   No current facility-administered medications on file prior to visit.    Review of Systems Per HPI unless specifically indicated above    Objective:    BP 136/84  Pulse 68  Temp(Src) 98.5 F (36.9 C) (Oral)  Wt 198 lb 4 oz (89.926 kg)  SpO2 97%  Physical Exam  Nursing note and vitals reviewed. Constitutional: She appears well-developed and well-nourished. No distress.  Tired appearing, mildly flushed  HENT:  Head: Normocephalic and atraumatic.  Right Ear: Hearing, tympanic membrane, external ear and ear canal normal.  Left Ear: Hearing, tympanic membrane, external ear and ear canal normal.  Nose: No mucosal edema or rhinorrhea. Right  sinus exhibits no maxillary sinus tenderness and no frontal sinus tenderness. Left sinus exhibits no maxillary sinus tenderness and no frontal sinus tenderness.  Mouth/Throat: Uvula is midline, oropharynx is clear and moist and mucous membranes are normal. No oropharyngeal exudate, posterior oropharyngeal edema, posterior oropharyngeal erythema or tonsillar abscesses.  Eyes: Conjunctivae and EOM are normal. Pupils are equal, round, and reactive to light. No scleral icterus.  Neck: Normal range of motion. Neck supple. No thyromegaly present.  Cardiovascular: Normal rate, regular rhythm, normal heart sounds and intact distal pulses.   No murmur heard. Pulmonary/Chest: Effort normal and breath sounds normal. No respiratory distress. She has no wheezes. She has no rales.  Abdominal: Soft. Bowel sounds are normal. She exhibits no distension and no mass. There is no tenderness. There is no rebound and no guarding.  Musculoskeletal: She exhibits no edema.  Lymphadenopathy:       Head (right side): No submental, no submandibular, no tonsillar, no preauricular and no posterior auricular adenopathy present.       Head (left side): No submental, no submandibular, no tonsillar, no preauricular and no posterior auricular adenopathy present.    She has no cervical adenopathy.       Right axillary: No lateral adenopathy present.       Left axillary: No lateral adenopathy present.      Right: No supraclavicular adenopathy present.       Left: No supraclavicular adenopathy present.  Skin: Skin is warm and dry.  No rash noted.       Assessment & Plan:   Problem List Items Addressed This Visit   Malaise - Primary     2 wk h/o malaise with headache and bone pain - longer than would be expected for uncomplicated viral illness. Check labwork today (CBC, CMP, ESR/CRP) and will call with results. Will need to eval today for calcium trouble, MM, blood dyscrasias, etc. Pt agrees with plan.    Relevant Orders       Comprehensive metabolic panel      CBC with Differential      High sensitivity CRP      Sedimentation rate    Other Visit Diagnoses   Bone pain        Relevant Orders       Comprehensive metabolic panel       CBC with Differential       High sensitivity CRP       Sedimentation rate        Follow up plan: Return if symptoms worsen or fail to improve.

## 2013-10-05 LAB — CBC WITH DIFFERENTIAL/PLATELET
BASOS PCT: 0.4 % (ref 0.0–3.0)
Basophils Absolute: 0 10*3/uL (ref 0.0–0.1)
Eosinophils Absolute: 0 10*3/uL (ref 0.0–0.7)
Eosinophils Relative: 0.4 % (ref 0.0–5.0)
HEMATOCRIT: 41.6 % (ref 36.0–46.0)
Hemoglobin: 14.3 g/dL (ref 12.0–15.0)
LYMPHS ABS: 1.1 10*3/uL (ref 0.7–4.0)
Lymphocytes Relative: 20.8 % (ref 12.0–46.0)
MCHC: 34.3 g/dL (ref 30.0–36.0)
MCV: 87.1 fl (ref 78.0–100.0)
Monocytes Absolute: 0.7 10*3/uL (ref 0.1–1.0)
Monocytes Relative: 13.9 % — ABNORMAL HIGH (ref 3.0–12.0)
Neutro Abs: 3.3 10*3/uL (ref 1.4–7.7)
Neutrophils Relative %: 64.5 % (ref 43.0–77.0)
Platelets: 145 10*3/uL — ABNORMAL LOW (ref 150.0–400.0)
RBC: 4.78 Mil/uL (ref 3.87–5.11)
RDW: 13.8 % (ref 11.5–15.5)
WBC: 5.1 10*3/uL (ref 4.0–10.5)

## 2013-10-05 LAB — COMPREHENSIVE METABOLIC PANEL
ALK PHOS: 119 U/L — AB (ref 39–117)
ALT: 14 U/L (ref 0–35)
AST: 17 U/L (ref 0–37)
Albumin: 3.9 g/dL (ref 3.5–5.2)
BUN: 14 mg/dL (ref 6–23)
CO2: 28 mEq/L (ref 19–32)
CREATININE: 0.9 mg/dL (ref 0.4–1.2)
Calcium: 10.9 mg/dL — ABNORMAL HIGH (ref 8.4–10.5)
Chloride: 107 mEq/L (ref 96–112)
GFR: 70.69 mL/min (ref >60.00)
Glucose, Bld: 96 mg/dL (ref 70–99)
POTASSIUM: 4.7 meq/L (ref 3.5–5.1)
Sodium: 138 mEq/L (ref 135–145)
Total Bilirubin: 0.8 mg/dL (ref 0.2–1.2)
Total Protein: 7 g/dL (ref 6.0–8.3)

## 2013-10-05 LAB — HIGH SENSITIVITY CRP: CRP, High Sensitivity: 6.27 mg/L — ABNORMAL HIGH (ref 0.000–5.000)

## 2013-10-05 LAB — SEDIMENTATION RATE: Sed Rate: 20 mm/hr (ref 0–22)

## 2013-10-05 LAB — LACTATE DEHYDROGENASE: LDH: 192 U/L (ref 94–250)

## 2013-10-07 ENCOUNTER — Telehealth: Payer: Self-pay | Admitting: Radiology

## 2013-10-07 ENCOUNTER — Encounter: Payer: Self-pay | Admitting: *Deleted

## 2013-10-07 ENCOUNTER — Ambulatory Visit (INDEPENDENT_AMBULATORY_CARE_PROVIDER_SITE_OTHER): Payer: Medicare HMO | Admitting: Radiology

## 2013-10-07 ENCOUNTER — Other Ambulatory Visit (INDEPENDENT_AMBULATORY_CARE_PROVIDER_SITE_OTHER): Payer: Medicare HMO | Admitting: Radiology

## 2013-10-07 DIAGNOSIS — E559 Vitamin D deficiency, unspecified: Secondary | ICD-10-CM

## 2013-10-07 LAB — VITAMIN D 25 HYDROXY (VIT D DEFICIENCY, FRACTURES): VITD: 19.28 ng/mL — ABNORMAL LOW (ref 30.00–100.00)

## 2013-10-07 NOTE — Telephone Encounter (Signed)
Patient coming in today [facility]

## 2013-10-07 NOTE — Telephone Encounter (Signed)
Message copied by Alvina ChouWALSH, Jerri Glauser J on Thu Oct 07, 2013 10:55 AM ------      Message from: Eustaquio BoydenGUTIERREZ, JAVIER      Created: Thu Oct 07, 2013  9:39 AM       Can we have her come in to recheck iPTH with calcium? For hypercalcemia            ----- Message -----         From: Alvina Chouerri J Maleek Craver         Sent: 10/07/2013   8:48 AM           To: Eustaquio BoydenJavier Gutierrez, MD            Sent a request to add the Vit d, PTH has to be redrawn      ----- Message -----         From: Eustaquio BoydenJavier Gutierrez, MD         Sent: 10/07/2013   8:04 AM           To: Alvina Chouerri J Ondrea Dow            Can we add PTH and vit D to blood in lab? Indication hypercalcemia             ------

## 2013-10-08 ENCOUNTER — Encounter: Payer: Self-pay | Admitting: Family Medicine

## 2013-10-08 ENCOUNTER — Ambulatory Visit (INDEPENDENT_AMBULATORY_CARE_PROVIDER_SITE_OTHER): Payer: Medicare HMO | Admitting: Family Medicine

## 2013-10-08 VITALS — BP 140/80 | HR 71 | Temp 99.0°F | Wt 199.0 lb

## 2013-10-08 DIAGNOSIS — A938 Other specified arthropod-borne viral fevers: Secondary | ICD-10-CM | POA: Insufficient documentation

## 2013-10-08 DIAGNOSIS — R509 Fever, unspecified: Secondary | ICD-10-CM

## 2013-10-08 DIAGNOSIS — I10 Essential (primary) hypertension: Secondary | ICD-10-CM

## 2013-10-08 LAB — CBC WITH DIFFERENTIAL/PLATELET
BASOS ABS: 0 10*3/uL (ref 0.0–0.1)
BASOS PCT: 0 % (ref 0–1)
Eosinophils Absolute: 0 10*3/uL (ref 0.0–0.7)
Eosinophils Relative: 0 % (ref 0–5)
HCT: 35.3 % — ABNORMAL LOW (ref 36.0–46.0)
Hemoglobin: 12.4 g/dL (ref 12.0–15.0)
Lymphocytes Relative: 23 % (ref 12–46)
Lymphs Abs: 1.4 10*3/uL (ref 0.7–4.0)
MCH: 29.2 pg (ref 26.0–34.0)
MCHC: 35.1 g/dL (ref 30.0–36.0)
MCV: 83.1 fL (ref 78.0–100.0)
MONO ABS: 0.7 10*3/uL (ref 0.1–1.0)
Monocytes Relative: 11 % (ref 3–12)
NEUTROS ABS: 4 10*3/uL (ref 1.7–7.7)
NEUTROS PCT: 66 % (ref 43–77)
Platelets: 114 10*3/uL — ABNORMAL LOW (ref 150–400)
RBC: 4.25 MIL/uL (ref 3.87–5.11)
RDW: 13.8 % (ref 11.5–15.5)
WBC: 6 10*3/uL (ref 4.0–10.5)

## 2013-10-08 LAB — POCT URINALYSIS DIPSTICK
Bilirubin, UA: NEGATIVE
Blood, UA: NEGATIVE
Glucose, UA: NEGATIVE
Ketones, UA: NEGATIVE
Leukocytes, UA: NEGATIVE
Nitrite, UA: NEGATIVE
PROTEIN UA: NEGATIVE
SPEC GRAV UA: 1.015
Urobilinogen, UA: 1
pH, UA: 6

## 2013-10-08 LAB — PTH, INTACT AND CALCIUM
Calcium: 10.2 mg/dL (ref 8.4–10.5)
PTH: 205.5 pg/mL — AB (ref 14.0–72.0)

## 2013-10-08 MED ORDER — DOXYCYCLINE HYCLATE 100 MG PO CAPS: 100.0000 mg | ORAL_CAPSULE | Freq: Two times a day (BID) | ORAL | Status: DC

## 2013-10-08 NOTE — Addendum Note (Signed)
Addended by: Eustaquio BoydenGUTIERREZ, Astha Probasco on: 10/08/2013 03:16 PM   Modules accepted: Orders

## 2013-10-08 NOTE — Patient Instructions (Signed)
I'm worried about tick borne illness like rocky mountain spotted fever. Treat with doxycycline 100mg  twice daily for 10 days - start today. Please seek urgent care if persistent fever despite treatment or feeling any worse Blood work today.  Rocky Mountain Spotted Fever Rocky Mountain Spotted Fever (RMSF) is the oldest known tick-borne disease of people in the [place]. This disease was named because it was first described among people in the [facility] area who had an illness characterized by a rash with red-purple-black spots. This disease is caused by a rickettsia (Rickettsia rickettsii), a bacteria carried by the tick. The [facility] wood tick and the American dog tick acquire and transmit the RMSF bacteria (pictures NOT actual size). When a larval, nymphal, or adult tick feeds on an infected rodent or larger animal, the tick can become infected. Infected adult ticks then feed on people who may then get RMSF. The tick transmits the disease to humans during a prolonged period of feeding that lasts many hours, days, or even a couple weeks. The bite is painless and frequently goes unnoticed. An infected female tick may also pass the rickettsial bacteria to her eggs that then may mature to be infected adult ticks. The rickettsia that causes RMSF can also get into a person's body through damaged skin. A tick bite is not necessary. People can get RMSF if they crush a tick and get its blood or body fluids on their skin through a small cut or sore.  DIAGNOSIS Diagnosis is made by laboratory tests.  TREATMENT Treatment is with antibiotics (medications that kill rickettsia and other bacteria). Immediate treatment usually prevents death. GEOGRAPHIC RANGE This disease was reported only in the [facility] until 1931. RMSF has more recently been described among individuals in all states except [place], [place], and [place]. The highest reported incidences of RMSF now occur among residents of  [place], [place], [place], and the [place]. TIME OF YEAR  Most cases are diagnosed during late spring and summer when ticks are most active. However, especially in the warmer [place] states, a few cases occur during the winter. SYMPTOMS   Symptoms of RMSF begin from 2 to 14 days after a tick bite. The most common early symptoms are fever, muscle aches, and headache followed by nausea (feeling sick to your stomach) or vomiting.  The RMSF rash is typically delayed until 3 or more days after symptom onset, and eventually develops in 9 of 10 infected patients by the fifth day of illness. If the disease is not treated it can cause death. If you get a fever, headache, muscle aches, rash, nausea, or vomiting within 2 weeks of a possible tick bite or exposure, you should see your caregiver immediately. PREVENTION Ticks prefer to hide in shady, moist ground litter. They can often be found above the ground clinging to tall grass, brush, shrubs and low tree branches. They also inhabit lawns and gardens, especially at the edges of woodlands and around old stone walls. Within the areas where ticks generally live, no naturally vegetated area can be considered completely free of infected ticks. The best precaution against RMSF is to avoid contact with soil, leaf litter, and vegetation as much as possible in tick-infested areas. For those who enjoy gardening or walking in their yards, clear brush and mow tall grass around houses and at the edges of gardens. This may help reduce the tick population in the immediate area. Applications of chemical insecticides by a licensed professional in the spring (late May) and fall (  September) will also control ticks, especially in heavily infested areas. Treatment will never get rid of all the ticks. Getting rid of small animal populations that host ticks will also decrease the tick population. When working in the garden, Mattelpruning shrubs, or handling soil and vegetation, wear  light-colored protective clothing and gloves. Spot-check often to prevent ticks from reaching the skin. Ticks cannot jump or fly. They will not drop from an above-ground perch onto a passing animal. Once a tick gains access to human skin it climbs upward until it reaches a more protected area. For example, the back of the knee, groin, navel, armpit, ears, or nape of the neck. It then begins the slow process of embedding itself in the skin. Campers, hikers, field workers, and others who spend time in wooded, brushy, or tall grassy areas can avoid exposure to ticks by using the following precautions:  Wear light-colored clothing with a tight weave to spot ticks more easily and prevent contact with the skin.  Wear long pants tucked into socks, long-sleeved shirts tucked into pants and enclosed shoes or boots along with insect repellent.  Spray clothes with insect repellent containing either DEET or Permethrin. Only DEET can be used on exposed skin. Follow the manufacturer's directions carefully.  Wear a hat and keep long hair pulled back.  Stay on cleared, well-worn trails whenever possible.  Spot-check yourself and others often for the presence of ticks on clothes. If you find one, there are likely to be others. Check thoroughly.  Remove clothes after leaving tick-infested areas. If possible, wash them to eliminate any unseen ticks. Check yourself, your children and any pets from head to toe for the presence of ticks.  Shower and shampoo. You can greatly reduce your chances of contracting RMSF if you remove attached ticks as soon as possible. Regular checks of the body, including all body sites covered by hair (head, armpits, genitals), allow removal of the tick before rickettsial transmission. To remove an attached tick, use a forceps or tweezers to detach the intact tick without leaving mouth parts in the skin. The tick bite wound should be cleansed after tick removal. Remember the most common  symptoms of RMSF are fever, muscle aches, headache, and nausea or vomiting with a later onset of rash. If you get these symptoms after a tick bite and while living in an area where RMSF is found, RMSF should be suspected. If the disease is not treated, it can cause death. See your caregiver immediately if you get these symptoms. Do this even if not aware of a tick bite. Document Released: 06/16/2000 Document Revised: 07/19/2013 Document Reviewed: 02/06/2009 [facility] Patient Information 2015 [employer] [place]. This information is not intended to replace advice given to you by your health care provider. Make sure you discuss any questions you have with your health care provider.

## 2013-10-08 NOTE — Addendum Note (Signed)
Addended by: Josph MachoANCE, Mairen Wallenstein A on: 10/08/2013 04:49 PM   Modules accepted: Orders

## 2013-10-08 NOTE — Assessment & Plan Note (Signed)
New fever over last few weeks along with headache, bone pain and malaise. Maculopapular rash that started last night affecting palms. Concern for RMSF or other tick borne illness (?STARI). Check RMSF titers as well as RPR and erlichiosis. Discussed need to immediately start doxycycline for 10 day course. Red flags to seek urgent care discussed.

## 2013-10-08 NOTE — Progress Notes (Signed)
BP 140/80  Pulse 71  Temp(Src) 99 F (37.2 C) (Tympanic)  Wt 199 lb (90.266 kg)  SpO2 98%   CC: f/u visit  Subjective:    Patient ID: Amanda Hancock, female    DOB: 08/30/1945, 68 y.o.   MRN: [id number]  HPI: Viann M Halberstam is a 68 y.o. female presenting on 10/08/2013 for Follow-up   See Monday's office visit. Briefly, headache, diffuse body bone pain, and fever to 102 worse at night time.  Unrevealing initial eval - ALP slightly elevated, plt slightly low 145, monocyte 13.9% (slightly high), CRP 6.9, Cr and other LFTs normal as well as WBC, ESR, LDH.  Presents today for f/u visit. Fever last night 101.6. Usually worsens at night time. sxs ongoing for last 2.5 weeks. Has been outside but did not see any ticks or tickbites. No recent travel Husband hasn't noticed rash or fever or headaches.  Last night rash developed - started on forearms and now spreading throughout body, even noticed some spots on palms.  Relevant past medical, surgical, family and social history reviewed and updated as indicated.  Allergies and medications reviewed and updated. Current Outpatient Prescriptions on File Prior to Visit  Medication Sig  . amLODipine-benazepril (LOTREL) 5-20 MG per capsule Take 1 capsule by mouth daily.  . aspirin 81 MG tablet Take 81 mg by mouth daily.   No current facility-administered medications on file prior to visit.    Review of Systems Per HPI unless specifically indicated above    Objective:    BP 140/80  Pulse 71  Temp(Src) 99 F (37.2 C) (Tympanic)  Wt 199 lb (90.266 kg)  SpO2 98%  Physical Exam  Nursing note and vitals reviewed. Constitutional: She appears well-developed and well-nourished. No distress.  Tired but nontoxic appearing  HENT:  Mouth/Throat: Oropharynx is clear and moist. No oropharyngeal exudate.  Neck: No thyromegaly present.  Cardiovascular: Normal rate, regular rhythm, normal heart sounds and intact distal pulses.   No murmur  heard. Pulmonary/Chest: Effort normal and breath sounds normal. No respiratory distress. She has no wheezes. She has no rales.  Musculoskeletal: She exhibits no edema.  Lymphadenopathy:    She has no cervical adenopathy.  Skin: Skin is warm and dry. Rash noted.  Blanching maculopapular rash throughout extremities and trunk, some spots on scalp. Some spots on palms of hands   Results for orders placed in visit on 10/07/13  PTH, INTACT AND CALCIUM      Result Value Ref Range   PTH 205.5 (*) 14.0 - 72.0 pg/mL   Calcium 10.2  8.4 - 10.5 mg/dL      Assessment & Plan:   Problem List Items Addressed This Visit   Tick borne fever - Primary     New fever over last few weeks along with headache, bone pain and malaise. Maculopapular rash that started last night affecting palms. Concern for RMSF or other tick borne illness (?STARI). Check RMSF titers as well as RPR and erlichiosis. Discussed need to immediately start doxycycline for 10 day course. Red flags to seek urgent care discussed.    Relevant Orders      Rocky mtn spotted fvr ab, IgM-blood      RPR      CBC with Differential      Ehrlichia Antibody Panel       Follow up plan: Return if symptoms worsen or fail to improve.   

## 2013-10-08 NOTE — Progress Notes (Signed)
Pre visit review using our clinic review tool, if applicable. No additional management support is needed unless otherwise documented below in the visit note. 

## 2013-10-09 ENCOUNTER — Other Ambulatory Visit: Payer: Self-pay | Admitting: Family Medicine

## 2013-10-09 DIAGNOSIS — E559 Vitamin D deficiency, unspecified: Secondary | ICD-10-CM | POA: Insufficient documentation

## 2013-10-09 LAB — RPR

## 2013-10-09 MED ORDER — CHOLECALCIFEROL 50 MCG (2000 UT) PO CAPS: 1.0000 | ORAL_CAPSULE | Freq: Every day | ORAL | Status: DC

## 2013-10-11 ENCOUNTER — Encounter: Payer: Self-pay | Admitting: *Deleted

## 2013-10-11 LAB — ROCKY MTN SPOTTED FVR AB, IGM-BLOOD: ROCKY MTN SPOTTED FEVER, IGM: 0.59 IV

## 2013-10-14 ENCOUNTER — Telehealth: Payer: Self-pay | Admitting: Family Medicine

## 2013-10-14 NOTE — Telephone Encounter (Signed)
Pt called to update you on tick fever. Pt is still weak and lungs still hurt. Pt has HA but not as bad and can tolerate it. No fever-running around 98. Some of rash has dried up. Pt is in good spirits and thinks medicine is working. She only has 4 more doses of medication before she is finished.  [phone]

## 2013-10-14 NOTE — Telephone Encounter (Signed)
Noted. Thanks for update. Recent treatment for tick borne illness with 10d doxy course. RMSF titers returned negative. If sxs recur would consider checking ehrlichiosis titers. Call us on Monday with another update after finishes abx course. Will route to PCP as fyi as I will be out of the office next week.

## 2013-10-15 NOTE — Telephone Encounter (Signed)
Patient notified. She is still complaining of a HA today that is worse than yesterday. She is going to try an aleve and call me back after lunch and give me an update.

## 2013-10-18 NOTE — Telephone Encounter (Signed)
Still having body aches and HA. Still feeling sluggish. No temp. Finished abx. Would like recheck. Appt scheduled.

## 2013-10-19 ENCOUNTER — Ambulatory Visit (INDEPENDENT_AMBULATORY_CARE_PROVIDER_SITE_OTHER): Payer: Medicare HMO | Admitting: Family Medicine

## 2013-10-19 ENCOUNTER — Encounter: Payer: Self-pay | Admitting: Family Medicine

## 2013-10-19 VITALS — BP 138/70 | HR 54 | Temp 98.0°F | Wt 196.2 lb

## 2013-10-19 DIAGNOSIS — R5383 Other fatigue: Secondary | ICD-10-CM

## 2013-10-19 DIAGNOSIS — R5381 Other malaise: Secondary | ICD-10-CM

## 2013-10-19 DIAGNOSIS — A938 Other specified arthropod-borne viral fevers: Secondary | ICD-10-CM

## 2013-10-19 LAB — CBC WITH DIFFERENTIAL/PLATELET
BASOS PCT: 0.3 % (ref 0.0–3.0)
Basophils Absolute: 0 10*3/uL (ref 0.0–0.1)
EOS PCT: 1.7 % (ref 0.0–5.0)
Eosinophils Absolute: 0.1 10*3/uL (ref 0.0–0.7)
HCT: 41.2 % (ref 36.0–46.0)
HEMOGLOBIN: 13.8 g/dL (ref 12.0–15.0)
Lymphocytes Relative: 30.7 % (ref 12.0–46.0)
Lymphs Abs: 1.7 10*3/uL (ref 0.7–4.0)
MCHC: 33.4 g/dL (ref 30.0–36.0)
MCV: 88.4 fl (ref 78.0–100.0)
MONOS PCT: 8.5 % (ref 3.0–12.0)
Monocytes Absolute: 0.5 10*3/uL (ref 0.1–1.0)
NEUTROS ABS: 3.2 10*3/uL (ref 1.4–7.7)
NEUTROS PCT: 58.8 % (ref 43.0–77.0)
Platelets: 198 10*3/uL (ref 150.0–400.0)
RBC: 4.66 Mil/uL (ref 3.87–5.11)
RDW: 13.9 % (ref 11.5–15.5)
WBC: 5.4 10*3/uL (ref 4.0–10.5)

## 2013-10-19 LAB — COMPREHENSIVE METABOLIC PANEL
ALBUMIN: 3.8 g/dL (ref 3.5–5.2)
ALT: 11 U/L (ref 0–35)
AST: 12 U/L (ref 0–37)
Alkaline Phosphatase: 119 U/L — ABNORMAL HIGH (ref 39–117)
BUN: 14 mg/dL (ref 6–23)
CALCIUM: 10.5 mg/dL (ref 8.4–10.5)
CHLORIDE: 107 meq/L (ref 96–112)
CO2: 28 mEq/L (ref 19–32)
CREATININE: 0.8 mg/dL (ref 0.4–1.2)
GFR: 75.81 mL/min (ref >60.00)
GLUCOSE: 92 mg/dL (ref 70–99)
Potassium: 4.1 mEq/L (ref 3.5–5.1)
Sodium: 140 mEq/L (ref 135–145)
Total Bilirubin: 0.7 mg/dL (ref 0.2–1.2)
Total Protein: 6.8 g/dL (ref 6.0–8.3)

## 2013-10-19 NOTE — Patient Instructions (Signed)
Great to see you. I will call you with your lab results.  Please come see me in 2 weeks.

## 2013-10-19 NOTE — Progress Notes (Signed)
Subjective:   Patient ID: Amanda Hancock, female    DOB: [birth year], 68 y.o.   MRN: [id number]  Amanda Hancock is a pleasant 68 y.o. year old female who presents to clinic today with Follow-up  on 10/19/2013  HPI: Saw Dr. Reece Agar on 7/20 and 10/08/2013.  Notes reviewed.  Presented with high fever- tmax 102, HA, bone pain. RMSF titer neg, CBC unremarkable.  ALP slightly elevated,  CRP 6.9, LFT normal.  Elevated PTH.  She did have contact with ticks - none actually removed from her body- presumed tick borne illness.  Treated with 10 day course of doxycyline.  Last dose yesterday. Last fever 4 days ago.  Body aches improved.  HA improved.  Still fatigued. Overall, feels much better.  Rash resolved.  Current Outpatient Prescriptions on File Prior to Visit  Medication Sig Dispense Refill  . amLODipine-benazepril (LOTREL) 5-20 MG per capsule Take 1 capsule by mouth daily.  30 capsule  5  . aspirin 81 MG tablet Take 81 mg by mouth daily.      . Cholecalciferol ([employer] VITAMIN D) 2000 UNITS CAPS Take 1 capsule (2,000 Units total) by mouth daily.  30 each     No current facility-administered medications on file prior to visit.    Allergies  Allergen Reactions  . Codeine Nausea And Vomiting and Other (See Comments)    headache  . Hctz [Hydrochlorothiazide]     Rash     Past Medical History  Diagnosis Date  . Elevated blood pressure (not hypertension)   . Abnormal Pap smear     ASCUS  . Chronic kidney disease     KIDNEYSTONES  . Vaginal Pap smear with ASC-US and positive HPV 12/20/2010  . Vaginal Pap smear, abnormal     Past Surgical History  Procedure Laterality Date  . Abdominal hysterectomy    . Tubal ligation      Family History  Problem Relation Age of Onset  . Parkinsonism Mother   . Dementia Mother   . Heart attack Paternal Grandfather   . Diabetes Maternal Grandmother     History   Social History  . Marital Status: Married    Spouse Name: N/A    Number of  Children: N/A  . Years of Education: N/A   Occupational History  . Not on file.   Social History Main Topics  . Smoking status: Never Smoker   . Smokeless tobacco: Never Used  . Alcohol Use: No  . Drug Use: No  . Sexual Activity: Not on file   Other Topics Concern  . Not on file   Social History Narrative   Does not have a living will.   Would desire CPR and life support if not futile.   The PMH, PSH, Social History, Family History, Medications, and allergies have been reviewed in [facility], and have been updated if relevant.       Review of Systems    See HPI No photophobia No nausea or vomiting NO CP or SOB Objective:    BP 138/70  Pulse 54  Temp(Src) 98 F (36.7 C) (Oral)  Wt 196 lb 4 oz (89.018 kg)  SpO2 96%   Physical Exam  Nursing note and vitals reviewed. Constitutional: She is oriented to person, place, and time. She appears well-developed and well-nourished. No distress.  HENT:  Head: Normocephalic.  Neurological: She is alert and oriented to person, place, and time. No cranial nerve deficit. Coordination normal.  Skin: Skin is warm and  dry. No rash noted.  Psychiatric: She has a normal mood and affect. Her behavior is normal. Judgment and thought content normal.          Assessment & Plan:   Malaise - Plan: [place] mtn spotted fvr abs pnl(IgG+IgM), Comprehensive metabolic panel, CBC with Differential Return in about 2 weeks (around 11/02/2013) for follow up.

## 2013-10-19 NOTE — Progress Notes (Signed)
Pre visit review using our clinic review tool, if applicable. No additional management support is needed unless otherwise documented below in the visit note. 

## 2013-10-19 NOTE — Assessment & Plan Note (Signed)
Symptoms improved. S/p course of doxycyline. Recheck labs today- have been unremarkable thus far. Follow up in 2 weeks. Ok to continue prn NSAIDs.

## 2013-10-20 LAB — ROCKY MTN SPOTTED FVR ABS PNL(IGG+IGM)
RMSF IGG: 0.4 IV
RMSF IgM: 1.07 IV — ABNORMAL HIGH

## 2013-11-03 ENCOUNTER — Encounter: Payer: Self-pay | Admitting: Family Medicine

## 2013-11-03 ENCOUNTER — Ambulatory Visit (INDEPENDENT_AMBULATORY_CARE_PROVIDER_SITE_OTHER): Payer: Medicare HMO | Admitting: Family Medicine

## 2013-11-03 VITALS — BP 156/86 | HR 52 | Temp 98.1°F | Wt 198.5 lb

## 2013-11-03 DIAGNOSIS — R079 Chest pain, unspecified: Secondary | ICD-10-CM

## 2013-11-03 DIAGNOSIS — A938 Other specified arthropod-borne viral fevers: Secondary | ICD-10-CM

## 2013-11-03 NOTE — Assessment & Plan Note (Signed)
Symptoms continue to improve. RMSF s/p appropriate course of doxycycline. Reassurance provided. Call or return to clinic prn if these symptoms worsen or fail to improve as anticipated. The patient indicates understanding of these issues and agrees with the plan.

## 2013-11-03 NOTE — Progress Notes (Signed)
Pre visit review using our clinic review tool, if applicable. No additional management support is needed unless otherwise documented below in the visit note. 

## 2013-11-03 NOTE — Assessment & Plan Note (Signed)
New- does not sound cardiac. EKG reassuring- NSR. ?anxiety Call or return to clinic prn if these symptoms worsen or fail to improve as anticipated.

## 2013-11-03 NOTE — Progress Notes (Signed)
Subjective:   Patient ID: Amanda Hancock, female    DOB: 1945-09-20, 68 y.o.   MRN: [id number]  Amanda Hancock is a pleasant [age] y.o. year old female who presents to clinic today with Follow-up  on 11/03/2013  HPI: Saw Dr. Reece AgarG on 7/20 and 10/08/2013.   Presented with high fever- tmax 102, HA, bone pain. RMSF titer neg, CBC unremarkable.  ALP slightly elevated,  CRP 6.9, LFT normal.  Elevated PTH.  She did have contact with ticks - none actually removed from her body- presumed tick borne illness.  Treated with 10 day course of doxycyline.    I saw her on 8/4.    Rash had resolved.  Was afebrile but still fatigued.  We rechecked labs- all neg except for elevated RMSF I gM. She is feeling better but still has some body aches and fatigue.  Feels more sweaty with exertion. Has had intermittent chest pain.  Not really with exertion- Feels it is more of a "twinge when resting."   Current Outpatient Prescriptions on File Prior to Visit  Medication Sig Dispense Refill  . amLODipine-benazepril (LOTREL) 5-20 MG per capsule Take 1 capsule by mouth daily.  30 capsule  5  . aspirin 81 MG tablet Take 81 mg by mouth daily.      . Cholecalciferol ([employer]S VITAMIN D) 2000 UNITS CAPS Take 1 capsule (2,000 Units total) by mouth daily.  30 each     No current facility-administered medications on file prior to visit.    Allergies  Allergen Reactions  . Codeine Nausea And Vomiting and Other (See Comments)    headache  . Hctz [Hydrochlorothiazide]     Rash     Past Medical History  Diagnosis Date  . Elevated blood pressure (not hypertension)   . Abnormal Pap smear     ASCUS  . Chronic kidney disease     KIDNEYSTONES  . Vaginal Pap smear with ASC-US and positive HPV 12/20/2010  . Vaginal Pap smear, abnormal     Past Surgical History  Procedure Laterality Date  . Abdominal hysterectomy    . Tubal ligation      Family History  Problem Relation Age of Onset  . Parkinsonism Mother   .  Dementia Mother   . Heart attack Paternal Grandfather   . Diabetes Maternal Grandmother     History   Social History  . Marital Status: Married    Spouse Name: N/A    Number of Children: N/A  . Years of Education: N/A   Occupational History  . Not on file.   Social History Main Topics  . Smoking status: Never Smoker   . Smokeless tobacco: Never Used  . Alcohol Use: No  . Drug Use: No  . Sexual Activity: Not on file   Other Topics Concern  . Not on file   Social History Narrative   Does not have a living will.   Would desire CPR and life support if not futile.   The PMH, PSH, Social History, Family History, Medications, and allergies [facility], and have been updated if relevant.       Review of Systems    See HPI No photophobia No nausea or vomiting NO CP or SOB Objective:    BP 156/86  Pulse 52  Temp(Src) 98.1 F (36.7 C) (Oral)  Wt 198 lb 8 oz (90.039 kg)  SpO2 96%   Physical Exam  Nursing note and vitals reviewed. Constitutional: She is  oriented to person, place, and time. She appears well-developed and well-nourished. No distress.  HENT:  Head: Normocephalic.  Cardiovascular: Normal rate.   Pulmonary/Chest: Effort normal and breath sounds normal.  Neurological: She is alert and oriented to person, place, and time. No cranial nerve deficit. Coordination normal.  Skin: Skin is warm and dry. No rash noted.  Psychiatric: She has a normal mood and affect. Her behavior is normal. Judgment and thought content normal.          Assessment & Plan:   Tick borne fever  Chest pain, unspecified No Follow-up on file.

## 2013-12-02 ENCOUNTER — Ambulatory Visit: Payer: Medicare HMO | Admitting: Family Medicine

## 2013-12-02 ENCOUNTER — Encounter (INDEPENDENT_AMBULATORY_CARE_PROVIDER_SITE_OTHER): Payer: Self-pay

## 2013-12-02 ENCOUNTER — Encounter: Payer: Self-pay | Admitting: Family Medicine

## 2013-12-02 ENCOUNTER — Ambulatory Visit (INDEPENDENT_AMBULATORY_CARE_PROVIDER_SITE_OTHER): Payer: Medicare HMO | Admitting: Family Medicine

## 2013-12-02 VITALS — BP 138/78 | HR 61 | Temp 98.3°F | Wt 202.5 lb

## 2013-12-02 DIAGNOSIS — M79605 Pain in left leg: Principal | ICD-10-CM

## 2013-12-02 DIAGNOSIS — M79609 Pain in unspecified limb: Secondary | ICD-10-CM

## 2013-12-02 DIAGNOSIS — M79604 Pain in right leg: Secondary | ICD-10-CM

## 2013-12-02 MED ORDER — TRAMADOL HCL 50 MG PO TABS: 25.0000 mg | ORAL_TABLET | Freq: Two times a day (BID) | ORAL | Status: DC | PRN

## 2013-12-02 NOTE — Patient Instructions (Signed)
Hold vitamin D for 2 weeks to see if any improvement. Let's check blood work today - for muscle function. Drink plenty of water for legs. Call us with an update. Try tramadol for leg pain (start at 1/2 tablet as needed). Tramadol may make you sleepy.

## 2013-12-02 NOTE — Assessment & Plan Note (Signed)
?  myositis - but strange to have persistent bone pain/myalgia from RMSF treated 1 month ago. Check CPK and electrolytes today - will call her with results. Not consistent with DVT as bilateral, strong distal pulses point against PAD. Mild bilateral pedal edema - ?CVI. rec increased water intake. In interim provided with tramadol prn leg pains - discussed sedation precautions

## 2013-12-02 NOTE — Progress Notes (Signed)
BP 138/78  Pulse 61  Temp(Src) 98.3 F (36.8 C) (Oral)  Wt 202 lb 8 oz (91.853 kg)  SpO2 95%   CC: persistent sxs  Subjective:    Patient ID: Amanda Hancock, female    DOB: 20-Sep-1945, 68 y.o.   MRN: [id number]  HPI: Amanda Hancock is a 68 y.o. female presenting on 12/02/2013 for still having sxs from tick bite   See last 4 notes over last month (twice by myself and twice by PCP) - seen here with concern for tick borne illness. Treated with 10d course doxycycline. RMSF IgM was elevated, all other workup overall unrevealing. Had significant bone pain at that time. Symptoms including fever and rash resolved.  However, has had persistent leg pain, has been treating with advil or aleve. Not helping. Would like something stronger for leg pain. Describes leg > arm pain. No knee or other joints. Bones and muscles ache - cramping pain. Present throughout legs. Pain present at rest and with walking.   Not on statin. On lotrel longterm. Recently vit D was started.   Increased stress over family situations - sister is taking her to court over mother's will.  Requests letter discussing this - provided. Declines medication for this. Wonders of leg pain related to increased stress.  Relevant past medical, surgical, family and social history reviewed and updated as indicated.  Allergies and medications reviewed and updated. Current Outpatient Prescriptions on File Prior to Visit  Medication Sig  . amLODipine-benazepril (LOTREL) 5-20 MG per capsule Take 1 capsule by mouth daily.  Marland Kitchen aspirin 81 MG tablet Take 81 mg by mouth daily.  . Cholecalciferol ([employer]S VITAMIN D) 2000 UNITS CAPS Take 1 capsule (2,000 Units total) by mouth daily.   No current facility-administered medications on file prior to visit.    Review of Systems Per HPI unless specifically indicated above    Objective:    BP 138/78  Pulse 61  Temp(Src) 98.3 F (36.8 C) (Oral)  Wt 202 lb 8 oz (91.853 kg)  SpO2 95%  Physical  Exam  Nursing note and vitals reviewed. Constitutional: She appears well-developed and well-nourished. No distress.  Musculoskeletal: Normal range of motion. She exhibits edema (tr pedal edema).  2+ DP bilaterally Bilateral legs tender to palpation  Skin: Skin is warm and dry. No rash noted.   Results for orders placed in visit on 10/19/13  ROCKY MTN SPOTTED FVR ABS PNL(IGG+IGM)      Result Value Ref Range   RMSF IgG 0.40     RMSF IgM 1.07 (*)   COMPREHENSIVE METABOLIC PANEL      Result Value Ref Range   Sodium 140  135 - 145 mEq/L   Potassium 4.1  3.5 - 5.1 mEq/L   Chloride 107  96 - 112 mEq/L   CO2 28  19 - 32 mEq/L   Glucose, Bld 92  70 - 99 mg/dL   BUN 14  6 - 23 mg/dL   Creatinine, Ser 0.8  0.4 - 1.2 mg/dL   Total Bilirubin 0.7  0.2 - 1.2 mg/dL   Alkaline Phosphatase 119 (*) 39 - 117 U/L   AST 12  0 - 37 U/L   ALT 11  0 - 35 U/L   Total Protein 6.8  6.0 - 8.3 g/dL   Albumin 3.8  3.5 - 5.2 g/dL   Calcium 40.9  8.4 - 81.1 mg/dL   GFR 91.47  >82.95 mL/min  CBC WITH DIFFERENTIAL  Result Value Ref Range   WBC 5.4  4.0 - 10.5 K/uL   RBC 4.66  3.87 - 5.11 Mil/uL   Hemoglobin 13.8  12.0 - 15.0 g/dL   HCT 16.1  09.6 - 04.5 %   MCV 88.4  78.0 - 100.0 fl   MCHC 33.4  30.0 - 36.0 g/dL   RDW 40.9  81.1 - 91.4 %   Platelets 198.0  150.0 - 400.0 K/uL   Neutrophils Relative % 58.8  43.0 - 77.0 %   Lymphocytes Relative 30.7  12.0 - 46.0 %   Monocytes Relative 8.5  3.0 - 12.0 %   Eosinophils Relative 1.7  0.0 - 5.0 %   Basophils Relative 0.3  0.0 - 3.0 %   Neutro Abs 3.2  1.4 - 7.7 K/uL   Lymphs Abs 1.7  0.7 - 4.0 K/uL   Monocytes Absolute 0.5  0.1 - 1.0 K/uL   Eosinophils Absolute 0.1  0.0 - 0.7 K/uL   Basophils Absolute 0.0  0.0 - 0.1 K/uL      Assessment & Plan:   Problem List Items Addressed This Visit   Leg pain, bilateral - Primary     ?myositis - but strange to have persistent bone pain/myalgia from RMSF treated 1 month ago. Check CPK and electrolytes today -  will call her with results. Not consistent with DVT as bilateral, strong distal pulses point against PAD. Mild bilateral pedal edema - ?CVI. rec increased water intake. In interim provided with tramadol prn leg pains - discussed sedation precautions    Relevant Orders      Basic metabolic panel      CK       Follow up plan: No Follow-up on file.

## 2013-12-02 NOTE — Progress Notes (Signed)
Pre visit review using our clinic review tool, if applicable. No additional management support is needed unless otherwise documented below in the visit note. 

## 2013-12-03 LAB — BASIC METABOLIC PANEL
BUN: 15 mg/dL (ref 6–23)
CALCIUM: 10.4 mg/dL (ref 8.4–10.5)
CHLORIDE: 109 meq/L (ref 96–112)
CO2: 23 meq/L (ref 19–32)
Creatinine, Ser: 0.8 mg/dL (ref 0.4–1.2)
GFR: 73.65 mL/min (ref >60.00)
Glucose, Bld: 94 mg/dL (ref 70–99)
POTASSIUM: 4.4 meq/L (ref 3.5–5.1)
SODIUM: 142 meq/L (ref 135–145)

## 2013-12-03 LAB — CK: Total CK: 36 U/L (ref 7–177)

## 2013-12-13 ENCOUNTER — Encounter: Payer: Self-pay | Admitting: Family Medicine

## 2013-12-13 ENCOUNTER — Ambulatory Visit (INDEPENDENT_AMBULATORY_CARE_PROVIDER_SITE_OTHER): Payer: Medicare HMO | Admitting: Family Medicine

## 2013-12-13 ENCOUNTER — Ambulatory Visit (INDEPENDENT_AMBULATORY_CARE_PROVIDER_SITE_OTHER)
Admission: RE | Admit: 2013-12-13 | Discharge: 2013-12-13 | Disposition: A | Discharge status: Home / Self Care | Payer: Medicare HMO | Source: Ambulatory Visit | Attending: Family Medicine | Admitting: Family Medicine

## 2013-12-13 VITALS — BP 144/92 | HR 61 | Temp 98.2°F | Resp 16 | Wt 203.0 lb

## 2013-12-13 DIAGNOSIS — R51 Headache: Secondary | ICD-10-CM

## 2013-12-13 DIAGNOSIS — R519 Headache, unspecified: Secondary | ICD-10-CM | POA: Insufficient documentation

## 2013-12-13 DIAGNOSIS — A938 Other specified arthropod-borne viral fevers: Secondary | ICD-10-CM

## 2013-12-13 DIAGNOSIS — R52 Pain, unspecified: Secondary | ICD-10-CM | POA: Insufficient documentation

## 2013-12-13 LAB — CBC WITH DIFFERENTIAL/PLATELET
Basophils Absolute: 0 10*3/uL (ref 0.0–0.1)
Basophils Relative: 0.2 % (ref 0.0–3.0)
EOS PCT: 2.1 % (ref 0.0–5.0)
Eosinophils Absolute: 0.1 10*3/uL (ref 0.0–0.7)
HCT: 40 % (ref 36.0–46.0)
HEMOGLOBIN: 13.5 g/dL (ref 12.0–15.0)
Lymphocytes Relative: 25.3 % (ref 12.0–46.0)
Lymphs Abs: 1.6 10*3/uL (ref 0.7–4.0)
MCHC: 33.7 g/dL (ref 30.0–36.0)
MCV: 88.5 fl (ref 78.0–100.0)
Monocytes Absolute: 0.5 10*3/uL (ref 0.1–1.0)
Monocytes Relative: 7.7 % (ref 3.0–12.0)
NEUTROS ABS: 4.1 10*3/uL (ref 1.4–7.7)
NEUTROS PCT: 64.7 % (ref 43.0–77.0)
Platelets: 174 10*3/uL (ref 150.0–400.0)
RBC: 4.51 Mil/uL (ref 3.87–5.11)
RDW: 14.4 % (ref 11.5–15.5)
WBC: 6.3 10*3/uL (ref 4.0–10.5)

## 2013-12-13 LAB — SEDIMENTATION RATE: Sed Rate: 16 mm/hr (ref 0–22)

## 2013-12-13 MED ORDER — IOHEXOL 300 MG/ML  SOLN
80.0000 mL | Freq: Once | INTRAMUSCULAR | Status: AC | PRN
  Administered 2013-12-13: 80 mL via INTRAVENOUS

## 2013-12-13 MED ORDER — HYDROCODONE-ACETAMINOPHEN 5-325 MG PO TABS: 0.5000 | ORAL_TABLET | Freq: Four times a day (QID) | ORAL | Status: DC | PRN

## 2013-12-13 NOTE — Assessment & Plan Note (Addendum)
Will obtain head CT w and w/o contrast to eval for space occupying lesion, infection. 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 If ID eval unrevealing, consider MRA neck vs cervical MRI. Doubt GCA - but will check ESR today.

## 2013-12-13 NOTE — Progress Notes (Signed)
BP 144/92  Pulse 61  Temp(Src) 98.2 F (36.8 C) (Oral)  Resp 16  Wt 203 lb (92.08 kg)  SpO2 96%   CC: persistent leg pain  Subjective:    Patient ID: Amanda Hancock, female    DOB: 12-24-1945, 68 y.o.   MRN: [id number]  HPI: Amanda Hancock is a 68 y.o. female presenting on 12/13/2013 for Medication Problem   See prior note for details. From that visit: See last 4 notes over last 2 months (twice by myself and twice by PCP) - seen here 10/08/2013 with concern for tick borne illness. Treated with 10d course doxycycline. RMSF IgM was elevated, all other workup overall unrevealing. Had significant bone pain at that time. Symptoms including fever and rash resolved.  Plan from last visit: ?myositis - but strange to have persistent bone pain/myalgia from RMSF treated 1 month ago.  Check CPK and electrolytes today - will call her with results.  Not consistent with DVT as bilateral, strong distal pulses point against PAD.  Mild bilateral pedal edema - ?CVI.  rec increased water intake.  In interim provided with tramadol prn leg pains - discussed sedation precautions  Today endorsing pain "from head to toe" and heat at top of head. Persistent bone ache - specifies bones, not as much muscles or joints. Tramadol helped initially but then lost effect. Tried trial off vitamin D to no avail (was newest medication). Describes leg > arm pain "from the hip down" as well as lower back. No knee or other joints are really affected. Pain present at rest while in bed and with walking.  Also describes headache that starts frontal head and radiates to back of head. Describes throbbing and ache. +photophobia and mild nausea. No phonophobia or vision changes or auras. Constant headache throughout the day. Some dizziness that remains present, worse if she stands too quickly. No h/o migraines in past. She is worried about persistent symptoms. Headache, nausea and dizziness reproducible when she looks upwards.  No  recent fever, no recent tylenol or ibuprofen use.  Relevant past medical, surgical, family and social history reviewed and updated as indicated.  Allergies and medications reviewed and updated. Current Outpatient Prescriptions on File Prior to Visit  Medication Sig  . amLODipine-benazepril (LOTREL) 5-20 MG per capsule Take 1 capsule by mouth daily.  Marland Kitchen aspirin 81 MG tablet Take 81 mg by mouth daily.  . Cholecalciferol ([employer]S VITAMIN D) 2000 UNITS CAPS Take 1 capsule (2,000 Units total) by mouth daily.   No current facility-administered medications on file prior to visit.    Review of Systems Per HPI unless specifically indicated above    Objective:    BP 144/92  Pulse 61  Temp(Src) 98.2 F (36.8 C) (Oral)  Resp 16  Wt 203 lb (92.08 kg)  SpO2 96%  Physical Exam  Nursing note and vitals reviewed. Constitutional: She is oriented to person, place, and time. She appears well-developed and well-nourished. No distress.  HENT:  Mouth/Throat: Oropharynx is clear and moist. No oropharyngeal exudate.  Very mild temporal tenderness  Eyes: Conjunctivae and EOM are normal. Pupils are equal, round, and reactive to light. No scleral icterus.  Neck: Normal range of motion. Neck supple. Carotid bruit is not present. No thyromegaly present.  No midline or paracervical mm tenderness  Cardiovascular: Normal rate, regular rhythm, normal heart sounds and intact distal pulses.   No murmur heard. Pulmonary/Chest: Effort normal and breath sounds normal. No respiratory distress. She has no wheezes. She has  no rales.  Musculoskeletal: She exhibits no edema.  Lymphadenopathy:    She has no cervical adenopathy.  Neurological: She is alert and oriented to person, place, and time. She has normal strength. No cranial nerve deficit or sensory deficit.  CN 2-12 intact Nausea/dizziness reproducible with looking upward Unsteady with romberg  Psychiatric:  Tearful with discussion of pain   Results for orders  placed in visit on 12/13/13  CBC WITH DIFFERENTIAL      Result Value Ref Range   WBC 6.3  4.0 - 10.5 K/uL   RBC 4.51  3.87 - 5.11 Mil/uL   Hemoglobin 13.5  12.0 - 15.0 g/dL   HCT 40.0  36.0 - 46.0 %   MCV 88.5  78.0 - 100.0 fl   MCHC 33.7  30.0 - 36.0 g/dL   RDW 14.4  11.5 - 15.5 %   Platelets 174.0  150.0 - 400.0 K/uL   Neutrophils Relative % 64.7  43.0 - 77.0 %   Lymphocytes Relative 25.3  12.0 - 46.0 %   Monocytes Relative 7.7  3.0 - 12.0 %   Eosinophils Relative 2.1  0.0 - 5.0 %   Basophils Relative 0.2  0.0 - 3.0 %   Neutro Abs 4.1  1.4 - 7.7 K/uL   Lymphs Abs 1.6  0.7 - 4.0 K/uL   Monocytes Absolute 0.5  0.1 - 1.0 K/uL   Eosinophils Absolute 0.1  0.0 - 0.7 K/uL   Basophils Absolute 0.0  0.0 - 0.1 K/uL  SEDIMENTATION RATE      Result Value Ref Range   Sed Rate 16  0 - 22 mm/hr      Assessment & Plan:   Problem List Items Addressed This Visit   Tick borne fever     Persistent symptoms after RMSF treated with 10d doxy course. Will refer to ID for their input/eval.    Relevant Orders      CT Head W Wo Contrast (Completed)      Ambulatory referral to Infectious Disease      CBC with Differential (Completed)      Sedimentation rate (Completed)   Headache(784.0) - Primary     Will obtain head CT w and w/o contrast to eval for space occupying lesion, infection. 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 If ID eval unrevealing, consider MRA neck vs cervical MRI. Doubt GCA - but will check ESR today.    Relevant Medications      HYDROcodone-acetaminophen (NORCO/VICODIN) 5-325 MG per tablet   Other Relevant Orders      CT Head W Wo Contrast (Completed)      Ambulatory referral to Infectious Disease   Body aches     Check CBC, ESR today.    Relevant Orders      CT Head W Wo Contrast (Completed)      Ambulatory referral to Infectious Disease       Follow up plan: No Follow-up on file.

## 2013-12-13 NOTE — Assessment & Plan Note (Signed)
Persistent symptoms after RMSF treated with 10d doxy course. Will refer to ID for their input/eval.

## 2013-12-13 NOTE — Patient Instructions (Addendum)
I'm sorry you're still feeling ill! I'd like to check blood work again - inflammatory markers. I'd also like to obtain head CT given persistent headache - pass by Marion's office to set this up. In the meantime, try stronger pain medication (hydrocodone prescription printed out) I'd like you to see our infectious disease specialists for persistent symptoms after RMSF.

## 2013-12-13 NOTE — Progress Notes (Signed)
Pre visit review using our clinic review tool, if applicable. No additional management support is needed unless otherwise documented below in the visit note. 

## 2013-12-14 NOTE — Assessment & Plan Note (Signed)
Check CBC, ESR today.

## 2013-12-15 ENCOUNTER — Inpatient Hospital Stay: Admission: RE | Admit: 2013-12-15 | Payer: Medicare HMO | Source: Ambulatory Visit

## 2013-12-30 ENCOUNTER — Ambulatory Visit (INDEPENDENT_AMBULATORY_CARE_PROVIDER_SITE_OTHER): Payer: Medicare HMO | Admitting: Internal Medicine

## 2013-12-30 ENCOUNTER — Encounter: Payer: Self-pay | Admitting: Internal Medicine

## 2013-12-30 VITALS — BP 172/66 | HR 69 | Temp 98.1°F | Ht 61.0 in | Wt 199.0 lb

## 2013-12-30 DIAGNOSIS — M2550 Pain in unspecified joint: Secondary | ICD-10-CM

## 2013-12-30 LAB — RHEUMATOID FACTOR: Rhuematoid fact SerPl-aCnc: <10 IU/mL (ref <=14)

## 2013-12-30 NOTE — Progress Notes (Signed)
Subjective:    Patient ID: Amanda Hancock, female    DOB: 1945-07-18, 68 y.o.   MRN: [id number]  HPI Colton is a 68yo F with hx of HTN, in usual health this past summer until in mid June. She noticed quick onset of bad headache, fevers of 102F, plus rash on arms and legs after having presumed tick bite. She had symptoms for roughly a week before going to PCP. She thought initially had viral infection. 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 Given 10 day course doxycycline, where fever was improved. Since finishing course of therapy, she still has residual headache, and arthralgias, which is now being treated with vicodin as needed. She has seen Dr. Ella Bodo several times in follow up for her symptoms of sequelae of headache and body ache.  Headache: she can wake up in hte morning with it. Frontal radiates back to occiput. She also has noticed having bone pain, predominantly arms. Spares joints?, but she reports " bones from head down". No swelling of joints or warmth of joints. She states that she has noticed worsening back and hip pain.   Ros: occ? Blurry vision, difficulty with reading glasses  Allergies  Allergen Reactions  . Codeine Nausea And Vomiting and Other (See Comments)    headache  . Hctz [Hydrochlorothiazide]     Rash      Current Outpatient Prescriptions on File Prior to Visit  Medication Sig Dispense Refill  . amLODipine-benazepril (LOTREL) 5-20 MG per capsule Take 1 capsule by mouth daily.  30 capsule  5  . aspirin 81 MG tablet Take 81 mg by mouth daily.      . Cholecalciferol ([employer]S VITAMIN D) 2000 UNITS CAPS Take 1 capsule (2,000 Units total) by mouth daily.  30 each    . HYDROcodone-acetaminophen (NORCO/VICODIN) 5-325 MG per tablet Take 0.5-1 tablets by mouth every 6 (six) hours as needed for moderate pain.  30 tablet  0   No current facility-administered medications on file prior to visit.   Active Ambulatory Problems    Diagnosis Date  Noted  . Vaginal Pap smear with LGSIL, positive HRHPV Type 16 03/16/2013  . HTN (hypertension) 01/28/2011  . Breast mass, right 02/21/2011  . Adjustment disorder with mixed anxiety and depressed mood 06/30/2012  . Medicare annual wellness visit, subsequent 03/16/2013  . Edema of right foot 08/02/2013  . Pedal edema 08/02/2013  . Malaise 10/04/2013  . Tick borne fever 10/08/2013  . Hypercalcemia 10/09/2013  . Unspecified vitamin D deficiency 10/09/2013  . Chest pain, unspecified 11/03/2013  . Leg pain, bilateral 12/02/2013  . Body aches 12/13/2013  . Headache(784.0) 12/13/2013   Resolved Ambulatory Problems    Diagnosis Date Noted  . Routine general medical examination at a health care facility 12/20/2010  . Acute sinusitis 05/29/2012   Past Medical History  Diagnosis Date  . Elevated blood pressure (not hypertension)   . Abnormal Pap smear   . Chronic kidney disease   . Vaginal Pap smear with ASC-US and positive HPV 12/20/2010  . Vaginal Pap smear, abnormal     History  Substance Use Topics  . Smoking status: Never Smoker   . Smokeless tobacco: Never Used  . Alcohol Use: No  family history includes Dementia in her mother; Diabetes in her maternal grandmother; Heart attack in her paternal grandfather; Parkinsonism in her mother.  Review of Systems 10 point ros is reviewed, positive pertinents listed in hpi    Objective:  Physical Exam BP 172/66  Pulse 69  Temp(Src) 98.1 F (36.7 C) (Oral)  Ht _0  (1.549 m)  Wt 199 lb (90.266 kg)  BMI 37.62 kg/m2 Physical Exam  Constitutional:  oriented to person, place, and time. appears well-developed and well-nourished. No distress.  HENT:  Mouth/Throat: Oropharynx is clear and moist. No oropharyngeal exudate. Upper dentures Cardiovascular: Normal rate, regular rhythm and normal heart sounds. Exam reveals no gallop and no friction rub.  No murmur heard.  Pulmonary/Chest: Effort normal and breath sounds normal. No respiratory  distress.  has no wheezes.  Abdominal: Soft. Bowel sounds are normal.  exhibits no distension. There is no tenderness.  Lymphadenopathy: no cervical adenopathy.  Neurological: alert and oriented to person, place, and time.  Skin: Skin is warm and dry. No rash noted. No erythema.  Psychiatric: a normal mood and affect. behavior is normal.       Assessment & Plan:  ? Post infectious reactive arthritis vs. Auto immune process. = in some cases of tick borne infections, patients do report sequelae of headache and athralgias. Will check ana, RF, and may consider hla b27.pending results may need rheumatology referral.  RMSF infection =Will check RMSF convelescent titers and lyme  Health maintenance = received flu shot last week at [facility]

## 2013-12-30 NOTE — Addendum Note (Signed)
Addended by: Mariea ClontsGREEN, Darcell Yacoub D on: 12/30/2013 12:55 PM   Modules accepted: Orders

## 2013-12-31 LAB — LYME AB/WESTERN BLOT REFLEX: B burgdorferi Ab IgG+IgM: 0.41 {ISR}

## 2013-12-31 LAB — ANA: Anti Nuclear Antibody(ANA): NEGATIVE

## 2014-01-03 LAB — B. BURGDORFI ANTIBODIES BY WB
B BURGDORFERI IGG ABS (IB): NEGATIVE
B burgdorferi IgM Abs (IB): NEGATIVE

## 2014-01-03 LAB — ROCKY MTN SPOTTED FVR AB, IGG-BLOOD: RMSF IgG: 0.21 IV

## 2014-01-03 LAB — ROCKY MTN SPOTTED FVR AB, IGM-BLOOD: ROCKY MTN SPOTTED FEVER, IGM: 0.46 IV

## 2014-01-06 ENCOUNTER — Telehealth: Payer: Self-pay | Admitting: *Deleted

## 2014-01-06 NOTE — Telephone Encounter (Signed)
Requesting call from Dr. Drue SecondSnider to discuss lab results from 12/30/13.

## 2014-01-17 ENCOUNTER — Ambulatory Visit (INDEPENDENT_AMBULATORY_CARE_PROVIDER_SITE_OTHER): Payer: Medicare HMO | Admitting: Family Medicine

## 2014-01-17 ENCOUNTER — Encounter: Payer: Self-pay | Admitting: Internal Medicine

## 2014-01-17 VITALS — BP 142/78 | HR 65 | Temp 98.4°F | Wt 198.2 lb

## 2014-01-17 DIAGNOSIS — M79604 Pain in right leg: Secondary | ICD-10-CM

## 2014-01-17 DIAGNOSIS — R52 Pain, unspecified: Secondary | ICD-10-CM

## 2014-01-17 DIAGNOSIS — A938 Other specified arthropod-borne viral fevers: Secondary | ICD-10-CM

## 2014-01-17 DIAGNOSIS — M79605 Pain in left leg: Secondary | ICD-10-CM

## 2014-01-17 NOTE — Progress Notes (Signed)
Subjective:   Patient ID: Amanda Hancock, female    DOB: 05-Aug-1945, 68 y.o.   MRN: [id number]  Amanda AvenaDiane M Armbrust is a pleasant 68 y.o. year old female who presents to clinic today with Follow-up  on 01/17/2014  HPI:  See multiple notes concerned myalgias/arthralgias- seen here with concern for tick borne illness- has seen Dr. Reece AgarG and myself multiple times. Treated with 10d course doxycycline. RMSF IgM was elevated, all other workup overall unrevealing. Had significant bone pain at that time. Symptoms including fever and rash resolved.  However, was having persistent leg pain, so Dr. Reece AgarG gave her rx for hydrocodone which has not helped much.    Was seen by ID- note reviewed from [facility] Snider- 12/30/13- ? Post infectious reactive arthritis vs autoimmune process.  ANA, RF, Lyme neg.  RMSF titers decreasing.    Per pt, she was told to come here for additional lab work.  Current Outpatient Prescriptions on File Prior to Visit  Medication Sig Dispense Refill  . amLODipine-benazepril (LOTREL) 5-20 MG per capsule Take 1 capsule by mouth daily. 30 capsule 5  . aspirin 81 MG tablet Take 81 mg by mouth daily.    . Cholecalciferol (CVS VITAMIN D) 2000 UNITS CAPS Take 1 capsule (2,000 Units total) by mouth daily. 30 each   . HYDROcodone-acetaminophen (NORCO/VICODIN) 5-325 MG per tablet Take 0.5-1 tablets by mouth every 6 (six) hours as needed for moderate pain. 30 tablet 0   No current facility-administered medications on file prior to visit.    Allergies  Allergen Reactions  . Codeine Nausea And Vomiting and Other (See Comments)    headache  . Hctz [Hydrochlorothiazide]     Rash     Past Medical History  Diagnosis Date  . Elevated blood pressure (not hypertension)   . Abnormal Pap smear     ASCUS  . Chronic kidney disease     KIDNEYSTONES  . Vaginal Pap smear with ASC-US and positive HPV 12/20/2010  . Vaginal Pap smear, abnormal     Past Surgical History  Procedure Laterality Date  .  Abdominal hysterectomy    . Tubal ligation      Family History  Problem Relation Age of Onset  . Parkinsonism Mother   . Dementia Mother   . Heart attack Paternal Grandfather   . Diabetes Maternal Grandmother     History   Social History  . Marital Status: Married    Spouse Name: N/A    Number of Children: N/A  . Years of Education: N/A   Occupational History  . Not on file.   Social History Main Topics  . Smoking status: Never Smoker   . Smokeless tobacco: Never Used  . Alcohol Use: No  . Drug Use: No  . Sexual Activity: Not on file   Other Topics Concern  . Not on file   Social History Narrative   Does not have a living will.   Would desire CPR and life support if not futile.   The PMH, PSH, Social History, Family History, Medications, and allergies [facility], and have been updated if relevant.  Review of Systems  Constitutional: Negative for fever.  Musculoskeletal: Positive for myalgias, back pain and arthralgias. Negative for joint swelling, gait problem and neck stiffness.  Skin: Negative.   Allergic/Immunologic: Negative.   Neurological: Negative.   Hematological: Negative.   Psychiatric/Behavioral: Negative.    See HPI    Objective:    BP 142/78 mmHg  Pulse 65  Temp(Src) 98.4 F (36.9 C) (Oral)  Wt 198 lb 4 oz (89.926 kg)  SpO2 97%   Physical Exam  Constitutional: She appears well-developed and well-nourished. No distress.  HENT:  Head: Normocephalic.  Cardiovascular: Normal rate.   Musculoskeletal: Normal range of motion.  Skin: Skin is warm.  Psychiatric: She has a normal mood and affect. Her behavior is normal. Judgment and thought content normal.  Nursing note and vitals reviewed.         Assessment & Plan:   Body aches  Tick borne fever No Follow-up on file.

## 2014-01-17 NOTE — Assessment & Plan Note (Signed)
Persistent. >15 minutes spent in face to face time with patient, >50% spent in counselling or coordination of care Explained to her that I am unsure which labs ID wanted me to order. I did suggest rheum referral but she wants me to contact Dr. Drue SecondSnider first- my chart message sent directly to her. The patient indicates understanding of these issues and agrees with the plan.

## 2014-01-17 NOTE — Progress Notes (Signed)
Pre visit review using our clinic review tool, if applicable. No additional management support is needed unless otherwise documented below in the visit note. 

## 2014-01-17 NOTE — Patient Instructions (Signed)
Great to see you. I am sorry that I am not sure what labs Dr. Drue SecondSnider wanted drawn.  After I hear back from her, I will call you.  In the meantime, I do suggest that perhaps I refer you to a rheumatologist- this is a someone who specializes in bone and muscle diseases.

## 2014-01-18 ENCOUNTER — Telehealth: Payer: Self-pay | Admitting: *Deleted

## 2014-01-18 DIAGNOSIS — IMO0001 Reserved for inherently not codable concepts without codable children: Secondary | ICD-10-CM

## 2014-01-18 NOTE — Telephone Encounter (Signed)
Spoke to pt and advised per Dr Dayton MartesAron. Pt states that she is agreeable to referral and is wanting to see Dr A. Azzie RoupShane Anderson on [address] [place]. Pt advised to await a call from scheduler with appt details

## 2014-01-18 NOTE — Telephone Encounter (Signed)
Referral placed.

## 2014-01-18 NOTE — Telephone Encounter (Signed)
-----   Message from Amanda Dunalia M Aron, MD sent at 01/17/2014  4:29 PM EST ----- Please see below and call pt to let her know that there was no specific test Dr. Drue SecondSnider wanted ordered.  I do suggest rheum referral at this time.  ----- Message -----    From: Amanda Munsonynthia Snider, MD    Sent: 01/17/2014   4:07 PM      To: Amanda Dunalia M Aron, MD  Lady GaryHi Talia, I think that there might have been a misunderstanding. [name] had called stating that she started to feel poorly again perhaps increased arthralgias. I suggested that she follows up with you for evaluation. I don't think this was related to what happened this past summer. I don't this she would need repeat RMSF or lyme ab since her visit in oct would correspond to convalescent testing.  ----- Message -----    From: Amanda Dunalia M Aron, MD    Sent: 01/17/2014  10:28 AM      To: Amanda Munsonynthia Snider, MD  Hello Aram Beechamynthia, [name] is here in my office now stating that you told her told her to come her for lab work.  I am unclear as to what you want drawn- I see that you already checked ANA, RF, Lyme and RMSF.  What else are you recommending? Thanks so much, Ruthe Mannanalia Hancock

## 2014-02-21 ENCOUNTER — Other Ambulatory Visit: Payer: Self-pay

## 2014-02-21 MED ORDER — AMLODIPINE BESY-BENAZEPRIL HCL 5-20 MG PO CAPS: 1.0000 | ORAL_CAPSULE | Freq: Every day | ORAL | Status: DC

## 2014-02-21 NOTE — Telephone Encounter (Signed)
Pt left v/m requesting refill amlodipine benazepril to [facility] [address]; pt last seen for acute visit 01/17/14 and last cpx 03/16/13;Please advise.

## 2014-02-22 NOTE — Telephone Encounter (Signed)
Pt request status of amlodipine benazepril. [facility] [address] and r x ready for pick up. Pt notified rx ready at [facility] and pt voiced understanding.

## 2014-08-16 ENCOUNTER — Other Ambulatory Visit: Payer: Self-pay | Admitting: *Deleted

## 2014-08-16 MED ORDER — AMLODIPINE BESY-BENAZEPRIL HCL 5-20 MG PO CAPS: 1.0000 | ORAL_CAPSULE | Freq: Every day | ORAL | Status: DC

## 2015-01-18 ENCOUNTER — Encounter: Payer: Self-pay | Admitting: Family Medicine

## 2015-01-18 ENCOUNTER — Ambulatory Visit (INDEPENDENT_AMBULATORY_CARE_PROVIDER_SITE_OTHER): Payer: Medicare HMO | Admitting: Family Medicine

## 2015-01-18 ENCOUNTER — Other Ambulatory Visit (HOSPITAL_COMMUNITY)
Admission: RE | Admit: 2015-01-18 | Discharge: 2015-01-18 | Disposition: A | Discharge status: Home / Self Care | Payer: Medicare HMO | Source: Ambulatory Visit | Attending: Family Medicine | Admitting: Family Medicine

## 2015-01-18 ENCOUNTER — Telehealth: Payer: Self-pay | Admitting: Family Medicine

## 2015-01-18 VITALS — BP 136/68 | HR 65 | Temp 98.0°F | Ht 61.0 in | Wt 200.5 lb

## 2015-01-18 DIAGNOSIS — Z01419 Encounter for gynecological examination (general) (routine) without abnormal findings: Secondary | ICD-10-CM | POA: Insufficient documentation

## 2015-01-18 DIAGNOSIS — I10 Essential (primary) hypertension: Secondary | ICD-10-CM

## 2015-01-18 DIAGNOSIS — Z1151 Encounter for screening for human papillomavirus (HPV): Secondary | ICD-10-CM | POA: Insufficient documentation

## 2015-01-18 DIAGNOSIS — Z780 Asymptomatic menopausal state: Secondary | ICD-10-CM | POA: Diagnosis not present

## 2015-01-18 DIAGNOSIS — Z1239 Encounter for other screening for malignant neoplasm of breast: Secondary | ICD-10-CM | POA: Diagnosis not present

## 2015-01-18 DIAGNOSIS — R87622 Low grade squamous intraepithelial lesion on cytologic smear of vagina (LGSIL): Secondary | ICD-10-CM

## 2015-01-18 DIAGNOSIS — Z23 Encounter for immunization: Secondary | ICD-10-CM

## 2015-01-18 DIAGNOSIS — E559 Vitamin D deficiency, unspecified: Secondary | ICD-10-CM

## 2015-01-18 DIAGNOSIS — F4323 Adjustment disorder with mixed anxiety and depressed mood: Secondary | ICD-10-CM

## 2015-01-18 DIAGNOSIS — Z0000 Encounter for general adult medical examination without abnormal findings: Secondary | ICD-10-CM

## 2015-01-18 LAB — COMPREHENSIVE METABOLIC PANEL
ALBUMIN: 3.6 g/dL (ref 3.5–5.2)
ALK PHOS: 119 U/L — AB (ref 39–117)
ALT: 14 U/L (ref 0–35)
AST: 13 U/L (ref 0–37)
BUN: 20 mg/dL (ref 6–23)
CO2: 28 mEq/L (ref 19–32)
Calcium: 10.2 mg/dL (ref 8.4–10.5)
Chloride: 110 mEq/L (ref 96–112)
Creatinine, Ser: 0.74 mg/dL (ref 0.40–1.20)
GFR: 82.64 mL/min (ref >60.00)
Glucose, Bld: 102 mg/dL — ABNORMAL HIGH (ref 70–99)
POTASSIUM: 4.1 meq/L (ref 3.5–5.1)
Sodium: 142 mEq/L (ref 135–145)
TOTAL PROTEIN: 6.4 g/dL (ref 6.0–8.3)
Total Bilirubin: 0.4 mg/dL (ref 0.2–1.2)

## 2015-01-18 LAB — CBC WITH DIFFERENTIAL/PLATELET
Basophils Absolute: 0 10*3/uL (ref 0.0–0.1)
Basophils Relative: 0.5 % (ref 0.0–3.0)
EOS PCT: 2.9 % (ref 0.0–5.0)
Eosinophils Absolute: 0.2 10*3/uL (ref 0.0–0.7)
HCT: 40.4 % (ref 36.0–46.0)
Hemoglobin: 13.4 g/dL (ref 12.0–15.0)
LYMPHS ABS: 1.4 10*3/uL (ref 0.7–4.0)
Lymphocytes Relative: 22.6 % (ref 12.0–46.0)
MCHC: 33.1 g/dL (ref 30.0–36.0)
MCV: 89.4 fl (ref 78.0–100.0)
MONOS PCT: 7.3 % (ref 3.0–12.0)
Monocytes Absolute: 0.5 10*3/uL (ref 0.1–1.0)
NEUTROS PCT: 66.7 % (ref 43.0–77.0)
Neutro Abs: 4.2 10*3/uL (ref 1.4–7.7)
Platelets: 165 10*3/uL (ref 150.0–400.0)
RBC: 4.52 Mil/uL (ref 3.87–5.11)
RDW: 13.9 % (ref 11.5–15.5)
WBC: 6.2 10*3/uL (ref 4.0–10.5)

## 2015-01-18 LAB — LIPID PANEL
CHOLESTEROL: 149 mg/dL (ref 0–200)
HDL: 40.7 mg/dL (ref >39.00)
LDL Cholesterol: 99 mg/dL (ref 0–99)
NonHDL: 108.39
Total CHOL/HDL Ratio: 4
Triglycerides: 48 mg/dL (ref 0.0–149.0)
VLDL: 9.6 mg/dL (ref 0.0–40.0)

## 2015-01-18 LAB — TSH: TSH: 1.19 u[IU]/mL (ref 0.35–4.50)

## 2015-01-18 NOTE — Assessment & Plan Note (Signed)
The patients weight, height, BMI and visual acuity have been recorded in the chart.  Cognitive function assessed.   I have made referrals, counseling and provided education to the patient based review of the above and I have provided the pt with a written personalized care plan for preventive services.  Prevnar 13, influenza given today. Labs today.  Mammogram and DEXA ordered  Orders Placed This Encounter  Procedures  . DG Bone Density  . MM Digital Screening  . Flu Vaccine QUAD 36+ mos PF IM (Fluarix & Fluzone Quad PF)  . Pneumococcal conjugate vaccine 13-valent IM  . CBC with Differential/Platelet  . Comprehensive metabolic panel  . Lipid panel  . TSH  . Hepatitis C Antibody

## 2015-01-18 NOTE — Addendum Note (Signed)
Addended by: Baldomero LamyHAVERS, NATASHA C on: 01/18/2015 09:37 AM   Modules accepted: Kipp BroodSmartSet

## 2015-01-18 NOTE — Assessment & Plan Note (Signed)
ASCiUS and positive HPV on 12/20/10 and 01/02/12.  Has had two colposcopies, most recently in 2015 but refuses follow up with GYN. Pap smear performed today.

## 2015-01-18 NOTE — Telephone Encounter (Signed)
i called to schedule pt mamm and bone density.  [place] stated the bone density has a dx of screening for breast cancer  Can you change order?

## 2015-01-18 NOTE — Addendum Note (Signed)
Addended by: Dianne DunARON, TALIA M on: 01/18/2015 09:51 AM   Modules accepted: Orders, SmartSet

## 2015-01-18 NOTE — Assessment & Plan Note (Signed)
Well controlled on current rx. No changes made today. 

## 2015-01-18 NOTE — Progress Notes (Signed)
Pre visit review using our clinic review tool, if applicable. No additional management support is needed unless otherwise documented below in the visit note. 

## 2015-01-18 NOTE — Patient Instructions (Addendum)
Great to see you. We will call you with your lab results from today and you can view them online.  Please stop by to see Amanda Hancock on your way out.

## 2015-01-18 NOTE — Telephone Encounter (Signed)
Left message asking pt to call office  °

## 2015-01-18 NOTE — Addendum Note (Signed)
Addended by: Desmond DikeKNIGHT, Ra Pfiester H on: 01/18/2015 09:27 AM   Modules accepted: Orders

## 2015-01-18 NOTE — Progress Notes (Signed)
Subjective:    Patient ID: Amanda Hancock, female    DOB: [birth year], 69 y.o.   MRN: [id number]  HPI  69 yo pleasant female here for annual wellness visit and follow up of chronic medical conditions.  I have personally reviewed the Medicare Annual Wellness questionnaire and have noted 1. The patient's medical and social history 2. Their use of alcohol, tobacco or illicit drugs 3. Their current medications and supplements 4. The patient's functional ability including ADL's, fall risks, home safety risks and hearing or visual             impairment. 5. Diet and physical activities 6. Evidence for depression or mood disorders  End of life wishes discussed and updated in Social History.  The roster of all physicians providing medical care to patient - is listed in the CareTeams section of the chart.  Zoster 12/20/10 Pneumovax 12/20/10 Mammogram 08/30/13  Remote h/o hysterectomy, does have h/o vaginal LGSIL.  Was seeing GYN but "refuses to go back." S/p colpo in 2015  HTN-  Currently taking Lotrel 5-20 mg daily.  Denies any HA, blurred vision, CP or SOB.  Lab Results  Component Value Date   CREATININE 0.8 12/02/2013     Patient Active Problem List   Diagnosis Date Noted  . Medicare annual wellness visit, subsequent 01/18/2015  . Hypercalcemia 10/09/2013  . Vitamin D deficiency 10/09/2013  . Vaginal Pap smear with LGSIL, positive HRHPV Type 16 03/16/2013  . Adjustment disorder with mixed anxiety and depressed mood 06/30/2012  . Breast mass, right 02/21/2011  . HTN (hypertension) 01/28/2011   Past Medical History  Diagnosis Date  . Elevated blood pressure (not hypertension)   . Abnormal Pap smear     ASCUS  . Chronic kidney disease     KIDNEYSTONES  . Vaginal Pap smear with ASC-US and positive HPV 12/20/2010  . Vaginal Pap smear, abnormal    Past Surgical History  Procedure Laterality Date  . Abdominal hysterectomy    . Tubal ligation     Social History   Substance Use Topics  . Smoking status: Never Smoker   . Smokeless tobacco: Never Used  . Alcohol Use: No   Family History  Problem Relation Age of Onset  . Parkinsonism Mother   . Dementia Mother   . Heart attack Paternal Grandfather   . Diabetes Maternal Grandmother    Allergies  Allergen Reactions  . Codeine Nausea And Vomiting and Other (See Comments)    headache  . Hctz [Hydrochlorothiazide]     Rash     Current outpatient prescriptions:  .  amLODipine-benazepril (LOTREL) 5-20 MG per capsule, Take 1 capsule by mouth daily., Disp: 30 capsule, Rfl: 5 .  aspirin 81 MG tablet, Take 81 mg by mouth daily., Disp: , Rfl:  .  Cholecalciferol (CVS VITAMIN D) 2000 UNITS CAPS, Take 1 capsule (2,000 Units total) by mouth daily., Disp: 30 each, Rfl:   Review of Systems  Constitutional: Negative.   HENT: Negative.   Eyes: Negative.   Respiratory: Negative.   Cardiovascular: Negative.   Gastrointestinal: Negative.   Endocrine: Negative.   Genitourinary: Negative.   Musculoskeletal: Negative.   Skin: Negative.   Allergic/Immunologic: Negative.   Neurological: Negative.   Hematological: Negative.   Psychiatric/Behavioral: Negative.   All other systems reviewed and are negative.    Objective:   Physical Exam BP 136/68 mmHg  Pulse 65  TemErnie Avena(36.7 C) (Oral)  Ht  (1.549 m)  Wt 200  lb 8 oz (90.946 kg)  BMI 37.90 kg/m2  SpO2 98%   General:  Well-developed,well-nourished,in no acute distress; alert,appropriate and cooperative throughout examination Head:  normocephalic and atraumatic.   Eyes:  vision grossly intact, pupils equal, pupils round, and pupils reactive to light.   Ears:  R ear normal and L ear normal.   Nose:  no external deformity.   Mouth:  good dentition.   Neck:  No deformities, masses, or tenderness noted. Breasts:  No mass, nodules, thickening, tenderness, bulging, retraction, inflamation, nipple discharge or skin changes noted.   Lungs:   Normal respiratory effort, chest expands symmetrically. Lungs are clear to auscultation, no crackles or wheezes. Heart:  Normal rate and regular rhythm. S1 and S2 normal without gallop, murmur, click, rub or other extra sounds. Abdomen:  Bowel sounds positive,abdomen soft and non-tender without masses, organomegaly or hernias noted. Rectal:  no external abnormalities.   Genitalia:  Pelvic Exam:        External: +external genital lesions on labia majora- unchanged        Vagina: normal without lesions or masses        Cervix: absent        Adnexa: absent        Uterus: absent        Pap smear: performed Msk:  No deformity or scoliosis noted of thoracic or lumbar spine.   Extremities:  No clubbing, cyanosis, edema, or deformity noted with normal full range of motion of all joints.   Neurologic:  alert & oriented X3 and gait normal.   Skin:  Intact without suspicious lesions or rashes Cervical Nodes:  No lymphadenopathy noted Axillary Nodes:  No palpable lymphadenopathy Psych:  Cognition and judgment appear intact. Alert and cooperative with normal attention span and concentration. No apparent delusions, illusions, hallucinations       Assessment & Plan:

## 2015-01-18 NOTE — Telephone Encounter (Signed)
Yes ok to schedule.  I will change order.  Thank you.

## 2015-01-19 LAB — HEPATITIS C ANTIBODY: HCV AB: NEGATIVE

## 2015-01-19 NOTE — Telephone Encounter (Signed)
Appointment made with Delford Fieldnorville pt aware

## 2015-01-23 ENCOUNTER — Encounter: Payer: Self-pay | Admitting: *Deleted

## 2015-01-23 LAB — CYTOLOGY - PAP

## 2015-02-06 ENCOUNTER — Ambulatory Visit
Admission: RE | Admit: 2015-02-06 | Discharge: 2015-02-06 | Disposition: A | Discharge status: Home / Self Care | Payer: Medicare HMO | Source: Ambulatory Visit | Attending: Family Medicine | Admitting: Family Medicine

## 2015-02-06 ENCOUNTER — Encounter: Payer: Self-pay | Admitting: *Deleted

## 2015-02-06 DIAGNOSIS — M8580 Other specified disorders of bone density and structure, unspecified site: Secondary | ICD-10-CM | POA: Diagnosis not present

## 2015-02-06 DIAGNOSIS — Z1231 Encounter for screening mammogram for malignant neoplasm of breast: Secondary | ICD-10-CM | POA: Insufficient documentation

## 2015-02-06 DIAGNOSIS — Z1239 Encounter for other screening for malignant neoplasm of breast: Secondary | ICD-10-CM

## 2015-02-06 DIAGNOSIS — Z780 Asymptomatic menopausal state: Secondary | ICD-10-CM

## 2015-02-15 ENCOUNTER — Other Ambulatory Visit: Payer: Self-pay | Admitting: Family Medicine

## 2015-07-25 ENCOUNTER — Ambulatory Visit (INDEPENDENT_AMBULATORY_CARE_PROVIDER_SITE_OTHER): Payer: Medicare HMO | Admitting: Family Medicine

## 2015-07-25 ENCOUNTER — Encounter: Payer: Self-pay | Admitting: Family Medicine

## 2015-07-25 VITALS — BP 138/70 | HR 76 | Temp 98.2°F | Wt 192.5 lb

## 2015-07-25 DIAGNOSIS — J069 Acute upper respiratory infection, unspecified: Secondary | ICD-10-CM

## 2015-07-25 MED ORDER — AMOXICILLIN 875 MG PO TABS: 875.0000 mg | ORAL_TABLET | Freq: Two times a day (BID) | ORAL | Status: DC

## 2015-07-25 NOTE — Progress Notes (Signed)
SUBJECTIVE:  Amanda Hancock is a 70 y.o. female who complains of coryza, congestion, productive cough and bilateral sinus pain for 21 days. She denies a history of anorexia, chest pain and fevers and denies a history of asthma. Patient denies smoke cigarettes.   Current Outpatient Prescriptions on File Prior to Visit  Medication Sig Dispense Refill  . amLODipine-benazepril (LOTREL) 5-20 MG capsule TAKE ONE CAPSULE BY MOUTH ONCE DAILY 90 capsule 3  . aspirin 81 MG tablet Take 81 mg by mouth daily.    . Cholecalciferol (CVS VITAMIN D) 2000 UNITS CAPS Take 1 capsule (2,000 Units total) by mouth daily. 30 each    No current facility-administered medications on file prior to visit.    Allergies  Allergen Reactions  . Codeine Nausea And Vomiting and Other (See Comments)    headache  . Hctz [Hydrochlorothiazide]     Rash     Past Medical History  Diagnosis Date  . Elevated blood pressure (not hypertension)   . Abnormal Pap smear     ASCUS  . Chronic kidney disease     KIDNEYSTONES  . Vaginal Pap smear with ASC-US and positive HPV 12/20/2010  . Vaginal Pap smear, abnormal     Past Surgical History  Procedure Laterality Date  . Abdominal hysterectomy    . Tubal ligation      Family History  Problem Relation Age of Onset  . Parkinsonism Mother   . Dementia Mother   . Heart attack Paternal Grandfather   . Diabetes Maternal Grandmother   . Breast cancer Maternal Aunt   . Breast cancer Paternal Aunt   . Dementia Cousin     Social History   Social History  . Marital Status: Married    Spouse Name: N/A  . Number of Children: N/A  . Years of Education: N/A   Occupational History  . Not on file.   Social History Main Topics  . Smoking status: Never Smoker   . Smokeless tobacco: Never Used  . Alcohol Use: No  . Drug Use: No  . Sexual Activity: Not on file   Other Topics Concern  . Not on file   Social History Narrative   Does not have a living will.   Would  desire CPR and life support if not futile.   The PMH, PSH, Social History, Family History, Medications, and allergies have been [facility], and have been updated if relevant.  OBJECTIVE: BP 138/70 mmHg  Pulse 76  Temp(Src) 98.2 F (36.8 C) (Oral)  Wt 192 lb 8 oz (87.317 kg)  SpO2 98%  She appears well, vital signs are as noted. Ears normal.  Throat and pharynx normal.  Neck supple. No adenopathy in the neck. Nose is congested. Sinuses tender. The chest is clear, without wheezes or rales.  ASSESSMENT:  sinusitis  PLAN: Given duration and progression of symptoms, will treat for bacterial sinusitis with amoxicillin.  Symptomatic therapy suggested: push fluids, rest and return office visit prn if symptoms persist or worsen.  Call or return to clinic prn if these symptoms worsen or fail to improve as anticipated.

## 2015-07-25 NOTE — Progress Notes (Signed)
Pre visit review using our clinic review tool, if applicable. No additional management support is needed unless otherwise documented below in the visit note. 

## 2015-08-28 ENCOUNTER — Telehealth: Payer: Self-pay | Admitting: Family Medicine

## 2015-08-28 NOTE — Telephone Encounter (Signed)
Pt has appt with Mayra ReelKate Clark NP on 08/31/15 at 9 AM.

## 2015-08-28 NOTE — Telephone Encounter (Signed)
Patient Name: Amanda StainsDIANE Hancock DOB: Jan 14, 1946 Initial Comment Caller states mom not eating, paranoid, confused, she thinks people are trying to kill her Nurse Assessment Nurse: Yetta BarreJones, RN, Miranda Date/Time (Eastern Time): 08/28/2015 10:55:34 AM Confirm and document reason for call. If symptomatic, describe symptoms. You must click the next button to save text entered. ---Caller states her mother has been confused for the last couple of months. She is also acting paranoid and says people are watching her. She is wanting to get help. Has the patient traveled out of the country within the last 30 days? ---Not Applicable Does the patient have any new or worsening symptoms? ---Yes Will a triage be completed? ---Yes Related visit to physician within the last 2 weeks? ---No Does the PT have any chronic conditions? (i.e. diabetes, asthma, etc.) ---Yes List chronic conditions. ---HTN Is this a behavioral health or substance abuse call? ---Yes Are you having any thoughts or feelings of harming or killing yourself or someone else? ---No Are you currently experiencing any physical discomfort that you think may be related to the use of alcohol or other drugs? (use substance abuse or alcohol abuse guidelines. These include withdrawal symptoms) ---No Do you worry that you may be hearing or seeing things that others do not? ---Yes Do you take medications for your condition(s)? ---No Guidelines Guideline Title Affirmed Question Affirmed Notes Confusion - Delirium Bizarre or paranoid behavior Final Disposition User Go to ED Now Yetta BarreJones, RN, Miranda [facility] Disagree/Comply: Comply

## 2015-08-28 NOTE — Telephone Encounter (Signed)
Noted. Patient's family refused ED, will evaluate her during her upcoming appointment.

## 2015-08-31 ENCOUNTER — Encounter: Payer: Self-pay | Admitting: Primary Care

## 2015-08-31 ENCOUNTER — Telehealth: Payer: Self-pay | Admitting: Primary Care

## 2015-08-31 ENCOUNTER — Ambulatory Visit (INDEPENDENT_AMBULATORY_CARE_PROVIDER_SITE_OTHER): Payer: Medicare HMO | Admitting: Primary Care

## 2015-08-31 VITALS — BP 162/92 | HR 53 | Temp 98.1°F | Ht 61.0 in | Wt 189.8 lb

## 2015-08-31 DIAGNOSIS — F418 Other specified anxiety disorders: Secondary | ICD-10-CM | POA: Diagnosis not present

## 2015-08-31 DIAGNOSIS — F329 Major depressive disorder, single episode, unspecified: Secondary | ICD-10-CM

## 2015-08-31 DIAGNOSIS — F419 Anxiety disorder, unspecified: Principal | ICD-10-CM

## 2015-08-31 DIAGNOSIS — F32A Depression, unspecified: Secondary | ICD-10-CM

## 2015-08-31 DIAGNOSIS — F4323 Adjustment disorder with mixed anxiety and depressed mood: Secondary | ICD-10-CM | POA: Diagnosis not present

## 2015-08-31 MED ORDER — SERTRALINE HCL 50 MG PO TABS: 50.0000 mg | ORAL_TABLET | Freq: Every day | ORAL | Status: DC

## 2015-08-31 NOTE — Progress Notes (Signed)
Pre visit review using our clinic review tool, if applicable. No additional management support is needed unless otherwise documented below in the visit note. 

## 2015-08-31 NOTE — Assessment & Plan Note (Signed)
Long history of since 2002 after her husband committed suicide, worse over past 6 months since law suit with her sister. Her thought are coherent, she is alert and oriented, tearful at times. She does experience hallucinations. GAD 7 score of 14 and PHQ 9 score of 9 today.  Given hallucinations will send to psychiatry for further evaluation.   In the mean time, will start Zoloft 50 mg. Patient is to take 1/2 tablet daily for 8 days, then advance to 1 full tablet thereafter. We discussed possible side effects of headache, GI upset, drowsiness, and SI/HI. If thoughts of SI/HI develop, we discussed to present to the emergency immediately. Patient verbalized understanding.   Follow up with psychiatry or PCP in 4 weeks if not able to get in with psychiatry sooner.

## 2015-08-31 NOTE — Telephone Encounter (Signed)
Pt placed on [facility] WQ. Pt and pt's children aware it can take a few weeks for someone to call and schedule. Also aware it could take a few months to get seen. Verbalized understand. All of [facility] info given to pt and pt children

## 2015-08-31 NOTE — Patient Instructions (Signed)
Start Zoloft (sertraline) tablets for anxiety and depression. Take 1/2 tablet by mouth every day for 8 days, then advance to 1 full tablet thereafter.  Stop by the front desk and speak with either Shirlee LimerickMarion or Revonda StandardAllison regarding your referral to psychiatry.  Please schedule a follow up appointment with Dr. Dayton MartesAron in 4 weeks for re-evaluation if you've not yet seen the psychiatrist.  Please notify myself or Dr. Dayton MartesAron if you develop any of the side effects we discussed today. Go to the emergency department if you develop suicidal thoughts.  It was a pleasure meeting you!

## 2015-08-31 NOTE — Progress Notes (Signed)
Subjective:    Patient ID: Amanda Hancock, female    DOB: [birth year], 70 y.o.   MRN: [id number]  HPI  [name] is a 70 year old female who presents today with a chief complaint of anxiety and depression. She has a history of anxiety and depression since 2002 after her husband committed suicide.   She is under a lot of stress and anxiety as she and her family are going through a stressful lawsuit. She is feeling paranoid and as though someone is out go harm her. Her daughter reports she is seeing/hearing things and people that other people do not see. She's seen men standing across the street from of her house several times. She can hear people from her sister's church in her yard at 11 pm some nights. She has never seen anyone as she is not looking out of her window. She's been feeling this way for the past six months since being involved in a law suit with her sister.   She reports multiple falls in the past several months as she is tripping over her own feet. Denies dizziness, feeling off-balance, chest pain, weakness. She was once managed on Zoloft for anxiety in the past but stop taking several years ago as she felt she no longer needed it. Denies SI/HI.  PHQ 9 score of 9, GAD 7 score of 14.  Her family reports she's not been the same since her husband committed suicide, and the recent lawsuit has really escalated her behavior and mood.  Review of Systems  Constitutional: Negative for fatigue.  Respiratory: Negative for shortness of breath.   Cardiovascular: Negative for chest pain.  Neurological: Negative for dizziness and weakness.  Psychiatric/Behavioral: Positive for sleep disturbance. Negative for suicidal ideas. The patient is nervous/anxious.        Past Medical History  Diagnosis Date  . Elevated blood pressure (not hypertension)   . Abnormal Pap smear     ASCUS  . Chronic kidney disease     KIDNEYSTONES  . Vaginal Pap smear with ASC-US and positive HPV 12/20/2010  .  Vaginal Pap smear, abnormal      Social History   Social History  . Marital Status: Married    Spouse Name: N/A  . Number of Children: N/A  . Years of Education: N/A   Occupational History  . Not on file.   Social History Main Topics  . Smoking status: Never Smoker   . Smokeless tobacco: Never Used  . Alcohol Use: No  . Drug Use: No  . Sexual Activity: Not on file   Other Topics Concern  . Not on file   Social History Narrative   Does not have a living will.   Would desire CPR and life support if not futile.    Past Surgical History  Procedure Laterality Date  . Abdominal hysterectomy    . Tubal ligation      Family History  Problem Relation Age of Onset  . Parkinsonism Mother   . Dementia Mother   . Heart attack Paternal Grandfather   . Diabetes Maternal Grandmother   . Breast cancer Maternal Aunt   . Breast cancer Paternal Aunt   . Dementia Cousin     Allergies  Allergen Reactions  . Codeine Nausea And Vomiting and Other (See Comments)    headache  . Hctz [Hydrochlorothiazide]     Rash     Current Outpatient Prescriptions on File Prior to Visit  Medication Sig Dispense Refill  .  amLODipine-benazepril (LOTREL) 5-20 MG capsule TAKE ONE CAPSULE BY MOUTH ONCE DAILY 90 capsule 3  . aspirin 81 MG tablet Take 81 mg by mouth daily.    . Cholecalciferol ([employer]S VITAMIN D) 2000 UNITS CAPS Take 1 capsule (2,000 Units total) by mouth daily. 30 each    No current facility-administered medications on file prior to visit.    BP 162/92 mmHg  Pulse 53  Temp(Src) 98.1 F (36.7 C) (Oral)  Ht  (1.549 m)  Wt 189 lb 12.8 oz (86.093 kg)  BMI 35.88 kg/m2  SpO2 98%    Objective:   Physical Exam  Constitutional: She is oriented to person, place, and time. She appears well-nourished.  Eyes: EOM are normal. Pupils are equal, round, and reactive to light.  Neck: Neck supple.  Cardiovascular: Normal rate and regular rhythm.   Pulmonary/Chest: Effort normal and  breath sounds normal.  Neurological: She is alert and oriented to person, place, and time. No cranial nerve deficit.  Skin: Skin is warm and dry.  Psychiatric: Her behavior is normal. Thought content normal.          Assessment & Plan:

## 2015-09-05 ENCOUNTER — Ambulatory Visit: Payer: Medicare HMO | Admitting: Family Medicine

## 2015-09-06 ENCOUNTER — Ambulatory Visit: Payer: Medicare HMO | Admitting: Family Medicine

## 2015-09-28 ENCOUNTER — Ambulatory Visit: Payer: Medicare HMO | Admitting: Primary Care

## 2015-10-02 ENCOUNTER — Encounter: Payer: Self-pay | Admitting: Psychiatry

## 2015-10-02 ENCOUNTER — Ambulatory Visit (INDEPENDENT_AMBULATORY_CARE_PROVIDER_SITE_OTHER): Payer: Medicare HMO | Admitting: Psychiatry

## 2015-10-02 VITALS — BP 162/90 | HR 59 | Temp 97.4°F | Ht 61.0 in | Wt 189.2 lb

## 2015-10-02 DIAGNOSIS — F333 Major depressive disorder, recurrent, severe with psychotic symptoms: Secondary | ICD-10-CM

## 2015-10-02 MED ORDER — ARIPIPRAZOLE 2 MG PO TABS: 2.0000 mg | ORAL_TABLET | Freq: Every day | ORAL | Status: DC

## 2015-10-02 MED ORDER — TRAZODONE HCL 50 MG PO TABS: 50.0000 mg | ORAL_TABLET | Freq: Every evening | ORAL | Status: DC | PRN

## 2015-10-02 NOTE — Progress Notes (Signed)
Psychiatric Initial Adult Assessment   Patient Identification: Amanda Hancock MRN:  [id number] Date of Evaluation:  10/02/2015 Referral Source: [facility]  Chief Complaint:   Chief Complaint    Establish Care; Anxiety; Depression     Visit Diagnosis:    ICD-9-CM ICD-10-CM   1. MDD (major depressive disorder), recurrent, severe, with psychosis (HCC) 296.34 F33.3     History of Present Illness:    Patient is a 70 year old female who presented for initial assessment accompanied by her son and her daughter. She was referred by her primary care physician. She reported that she has been having severe depression and anxiety visual hallucinations and olfactory hallucinations for the past 6 months. She reported that it all started as her sisters have been bringing a lawsuit against their father's property. She has been feeling paranoid and thinks that somebody has been watching her. She sees a man standing across the street from her house several times. She reported that she also has olfactory hallucinations of a smell coming through her went and she is unable to sleep. She thinks that there is chemical coming through her rent and her husband cannot smell although he is sleeping in the same house. She reported that the people are coming in the house at night and are cutting the gas. Patient reported that she is unable to sleep well at night. She reported that these symptoms are getting worse for the past 6 months. She was discussing at length about the will of her father who has committed suicide in 2002.  She reported that she did not seek help after the traumatic suicide of her father.  She was the executor of his will but then he changed his will just before his death in the name of his wife as she has history of schizoaffective disorder Parkinson's and dementia. Patient reported that her mother had several medical conditions and she gave him money. She reported that her mother finally  decided to give the house in the name of her younger sister who is currently living in the house with her family. Her middle sister is now suing a family for the house and they are in the court. She reported that she is very much stressed out with his situation and there is a [employer] who has been involved and helping the middle sister. She feels that the preacher is coming out in her neighborhood and has been in the house across from her home. She has been delusional about him as well. She reported that she does not go to the church on a regular basis.  She currently denied having any suicidal homicidal ideations or plans.  Associated Signs/Symptoms: Depression Symptoms:  insomnia, psychomotor retardation, fatigue, difficulty concentrating, hopelessness, anxiety, loss of energy/fatigue, disturbed sleep, (Hypo) Manic Symptoms:  Labiality of Mood, Anxiety Symptoms:  Excessive Worry, Psychotic Symptoms:  Delusions, Hallucinations: Olfactory Visual PTSD Symptoms: Had a traumatic exposure:  Father committing suicide  Past Psychiatric History:  She was just started on Zoloft by her primary care physician. She denied any previous history of suicide attempts. No previous psychiatric hospitalization  Previous Psychotropic Medications: Zoloft  Substance Abuse History in the last 12 months:  No.  Consequences of Substance Abuse: Negative NA  Past Medical History:  Past Medical History  Diagnosis Date  . Elevated blood pressure (not hypertension)   . Abnormal Pap smear     ASCUS  . Chronic kidney disease     KIDNEYSTONES  . Vaginal Pap smear  with ASC-US and positive HPV 12/20/2010  . Vaginal Pap smear, abnormal   . Anxiety   . Depression     Past Surgical History  Procedure Laterality Date  . Abdominal hysterectomy    . Tubal ligation      Family Psychiatric History:   Mother with schizoaffective disorder, parkinsonism and dementia.   Family History:  Family History   Problem Relation Age of Onset  . Parkinsonism Mother   . Dementia Mother   . Heart attack Paternal Grandfather   . Diabetes Maternal Grandmother   . Breast cancer Maternal Aunt   . Breast cancer Paternal Aunt   . Dementia Cousin     Social History:   Social History   Social History  . Marital Status: Married    Spouse Name: N/A  . Number of Children: N/A  . Years of Education: N/A   Social History Main Topics  . Smoking status: Never Smoker   . Smokeless tobacco: Never Used  . Alcohol Use: No  . Drug Use: No  . Sexual Activity: Not Currently   Other Topics Concern  . None   Social History Narrative   Does not have a living will.   Would desire CPR and life support if not futile.    Additional Social History:  Currently married for the past 47 years and lives with her husband. She has 2 children a son and a daughter who came for the appointment with her.  Allergies:   Allergies  Allergen Reactions  . Codeine Nausea And Vomiting and Other (See Comments)    headache  . Hctz [Hydrochlorothiazide]     Rash     Metabolic Disorder Labs: No results found for: HGBA1C, MPG No results found for: PROLACTIN Lab Results  Component Value Date   CHOL 149 01/18/2015   TRIG 48.0 01/18/2015   HDL 40.70 01/18/2015   CHOLHDL 4 01/18/2015   VLDL 9.6 01/18/2015   LDLCALC 99 01/18/2015   LDLCALC 115* 03/16/2013     Current Medications: Current Outpatient Prescriptions  Medication Sig Dispense Refill  . amLODipine-benazepril (LOTREL) 5-20 MG capsule TAKE ONE CAPSULE BY MOUTH ONCE DAILY 90 capsule 3  . aspirin 81 MG tablet Take 81 mg by mouth daily.    . Cholecalciferol (CVS VITAMIN D) 2000 UNITS CAPS Take 1 capsule (2,000 Units total) by mouth daily. 30 each   . sertraline (ZOLOFT) 50 MG tablet Take 1 tablet (50 mg total) by mouth daily. 30 tablet 3  . ARIPiprazole (ABILIFY) 2 MG tablet Take 1 tablet (2 mg total) by mouth daily. 30 tablet 1  . traZODone (DESYREL) 50 MG  tablet Take 1 tablet (50 mg total) by mouth at bedtime as needed for sleep. 30 tablet 0   No current facility-administered medications for this visit.    Neurologic: Headache: No Seizure: No Paresthesias:No  Musculoskeletal: Strength & Muscle Tone: within normal limits Gait & Station: normal Patient leans: N/A  Psychiatric Specialty Exam: Review of Systems  Musculoskeletal: Positive for back pain.  Psychiatric/Behavioral: Positive for depression. The patient is nervous/anxious and has insomnia.     Blood pressure 162/90, pulse 59, temperature 97.4 F (36.3 C), temperature source Tympanic, height 5\' 1"  (1.549 m), weight 189 lb 3.2 oz (85.821 kg), SpO2 95 %.Body mass index is 35.77 kg/(m^2).  General Appearance: Casual  Eye Contact:  Fair  Speech:  Clear and Coherent  Volume:  Normal  Mood:  Anxious and Depressed  Affect:  Constricted and Depressed  Thought  Process:  Coherent  Orientation:  Full (Time, Place, and Person)  Thought Content:  Delusions and Hallucinations: Olfactory Visual  Suicidal Thoughts:  No  Homicidal Thoughts:  No  Memory:  Immediate;   Fair Recent;   Fair  Judgement:  Fair  Insight:  Fair  Psychomotor Activity:  Psychomotor Retardation  Concentration:  Concentration: Fair and Attention Span: Fair  Recall:  FiservFair  Fund of Knowledge:Fair  Language: Fair  Akathisia:  No  Handed:  Right  AIMS (if indicated):    Assets:  Manufacturing systems engineerCommunication Skills Physical Health Social Support Transportation  ADL's:  Intact  Cognition: WNL  Sleep:  poor    Treatment Plan Summary: Medication management   Discussed with patient and her daughter at length about the medications treatment risk benefits and alternatives  I was start her on Abilify 2 mg daily for her paranoia or delusion and hallucinations. Discussed with the daughter about the side effects and she agreed with the plan. She will continue on Zoloft 50 mg in the morning Also start her on trazodone 50 mg by  mouth daily at bedtime when necessary for insomnia. Advised daughter about her blood pressure and she will continue to do with her primary care physician.   More than 50% of the time spent in psychoeducation, counseling and coordination of care.  Follow up in 2 weeks   This note was generated in part or whole with voice recognition software. Voice regonition is usually quite accurate but there are transcription errors that can and very often do occur. I apologize for any typographical errors that were not detected and corrected.     Brandy HaleUzma Britt Theard, MD 7/17/20179:54 AM

## 2015-10-03 ENCOUNTER — Ambulatory Visit (INDEPENDENT_AMBULATORY_CARE_PROVIDER_SITE_OTHER): Payer: Medicare HMO | Admitting: Primary Care

## 2015-10-03 VITALS — BP 152/84 | HR 58 | Temp 98.2°F | Ht 61.0 in | Wt 189.8 lb

## 2015-10-03 DIAGNOSIS — I10 Essential (primary) hypertension: Secondary | ICD-10-CM | POA: Diagnosis not present

## 2015-10-03 DIAGNOSIS — F4323 Adjustment disorder with mixed anxiety and depressed mood: Secondary | ICD-10-CM | POA: Diagnosis not present

## 2015-10-03 NOTE — Assessment & Plan Note (Signed)
Above goal on last several visits. Suspect elevation in BP due to anxiety and paranoia as historically BP has been stable on current regimen. Discussed options for treatment including increasing current BP dose, improving diet/exercise, allowing treatment to take effect for anxiety/paranoia.  She and her family elect to work on diet and allow treatment to take effect. They will monitor BP and notify us if no improvement after 1 month of consistent treatment. BP today in clinic slightly above goal, but stable and is asymptomatic.  Parameters provided for BP goal of less than 150/90 given age.

## 2015-10-03 NOTE — Progress Notes (Signed)
Subjective:    Patient ID: Amanda Hancock, female    DOB: March 14, 1946, 70 y.o.   MRN: [id number]  HPI  Amanda Hancock is a 70 year old female who presents today for follow up of Major Depressive Disorder. She and her family presented to our clinic on 06/15 with complaints of anxiety and depression with paranoia and hallucinations. She was initiated on Zoloft 50 mg and referred to psychiatry for further evaluation and assessment.  She has since been evaluated by psychiatry (yesterday) and was initiated on Abilify 2 mg for paranoia/delusion and hallucinations, and Trazodone 50 mg at bedtime for insomnia. Since evaluation by psychiatry she has not started the Abilify as it is too costly. She has been taking 1/2 tablet of her Zoloft since her visit on 06/15 as she was confused on the directions. She has been taking 1 full tablet for the past several days. As of yet she's only noticed a slight improvement in mood.  2) Essential Hypertension: Currently managed on Amlodipine-Benazepril 5-20 mg tablets. Her BP was above goal during the psychiatry visit, her visit on 06/15, and also her visit today. She's checking her BP at home which has been running 160's/80's on average. Denies headaches, chest pain, sudden visual changes. Historically her BP has been stable which was prior to her symptoms of anxiety and paranoia.  Review of Systems  Respiratory: Negative for shortness of breath.   Cardiovascular: Negative for chest pain.  Neurological: Negative for dizziness and headaches.  Psychiatric/Behavioral: Positive for hallucinations. Negative for suicidal ideas. The patient is nervous/anxious.        Past Medical History  Diagnosis Date  . Elevated blood pressure (not hypertension)   . Abnormal Pap smear     ASCUS  . Chronic kidney disease     KIDNEYSTONES  . Vaginal Pap smear with ASC-US and positive HPV 12/20/2010  . Vaginal Pap smear, abnormal   . Anxiety   . Depression      Social History    Social History  . Marital Status: Married    Spouse Name: N/A  . Number of Children: N/A  . Years of Education: N/A   Occupational History  . Not on file.   Social History Main Topics  . Smoking status: Never Smoker   . Smokeless tobacco: Never Used  . Alcohol Use: No  . Drug Use: No  . Sexual Activity: Not Currently   Other Topics Concern  . Not on file   Social History Narrative   Does not have a living will.   Would desire CPR and life support if not futile.    Past Surgical History  Procedure Laterality Date  . Abdominal hysterectomy    . Tubal ligation      Family History  Problem Relation Age of Onset  . Parkinsonism Mother   . Dementia Mother   . Heart attack Paternal Grandfather   . Diabetes Maternal Grandmother   . Breast cancer Maternal Aunt   . Breast cancer Paternal Aunt   . Dementia Cousin     Allergies  Allergen Reactions  . Codeine Nausea And Vomiting and Other (See Comments)    headache  . Hctz [Hydrochlorothiazide]     Rash     Current Outpatient Prescriptions on File Prior to Visit  Medication Sig Dispense Refill  . amLODipine-benazepril (LOTREL) 5-20 MG capsule TAKE ONE CAPSULE BY MOUTH ONCE DAILY 90 capsule 3  . aspirin 81 MG tablet Take 81 mg by mouth daily.    Marland Kitchen  Cholecalciferol (CVS VITAMIN D) 2000 UNITS CAPS Take 1 capsule (2,000 Units total) by mouth daily. 30 each   . sertraline (ZOLOFT) 50 MG tablet Take 1 tablet (50 mg total) by mouth daily. 30 tablet 3  . traZODone (DESYREL) 50 MG tablet Take 1 tablet (50 mg total) by mouth at bedtime as needed for sleep. 30 tablet 0  . ARIPiprazole (ABILIFY) 2 MG tablet Take 1 tablet (2 mg total) by mouth daily. (Patient not taking: Reported on 10/03/2015) 30 tablet 1   No current facility-administered medications on file prior to visit.    BP 152/84 mmHg  Pulse 58  Temp(Src) 98.2 F (36.8 C) (Oral)  Ht 5\' 1"  (1.549 m)  Wt 189 lb 12.8 oz (86.093 kg)  BMI 35.88 kg/m2  SpO2  95%    Objective:   Physical Exam  Constitutional: She appears well-nourished.  Neck: Neck supple.  Cardiovascular: Normal rate and regular rhythm.   Pulmonary/Chest: Effort normal and breath sounds normal.  Skin: Skin is warm and dry.  Psychiatric: She has a normal mood and affect.  Improved since last visit.          Assessment & Plan:

## 2015-10-03 NOTE — Assessment & Plan Note (Addendum)
Some improvement on Zoloft, but has only recently been taking 1 full tablet. Evaluated by psychiatry yesterday, Abilify too expensive. Discussed for her to call the office to notify. Feels as though she's slept well with Trazodone. Denies SI/HI. Follow up with PCP or myself in 3 months.

## 2015-10-03 NOTE — Patient Instructions (Signed)
Continue Zoloft 50 mg tablets for anxiety and depression.  Call the psychiatrist's office and notify them of the cost of Abilify.  Continue to monitor your blood pressure. Your blood pressure should run less than 150/90. Please notify myself or Dr. Dayton MartesAron if your blood pressure continues to remain elevated after 1 month of treatment.  Follow up in 3 months with Dr. Dayton MartesAron or myself for re-evaluation.  It was a pleasure to see you today! Happy Birthday!

## 2015-10-03 NOTE — Progress Notes (Signed)
Pre visit review using our clinic review tool, if applicable. No additional management support is needed unless otherwise documented below in the visit note. 

## 2015-10-11 ENCOUNTER — Telehealth: Payer: Self-pay

## 2015-10-11 NOTE — Telephone Encounter (Signed)
pt daughter called states that they can not afford the abilify.  it going to be over $180 a month they can not afford that .  is there anything else that she can try that is not so expensive.

## 2015-10-16 NOTE — Telephone Encounter (Signed)
Ask about Haldol. We can try her on Haldol 1mg  po qhs.

## 2015-10-23 ENCOUNTER — Other Ambulatory Visit: Payer: Self-pay | Admitting: Psychiatry

## 2015-10-26 NOTE — Telephone Encounter (Signed)
was this medication already sent in?

## 2015-10-27 ENCOUNTER — Other Ambulatory Visit: Payer: Self-pay | Admitting: Psychiatry

## 2015-10-27 MED ORDER — HALOPERIDOL 1 MG PO TABS: ORAL_TABLET | ORAL | 1 refills | Status: DC

## 2015-12-27 ENCOUNTER — Other Ambulatory Visit: Payer: Self-pay | Admitting: Psychiatry

## 2015-12-28 NOTE — Telephone Encounter (Signed)
Pt need to make an appointment

## 2015-12-31 ENCOUNTER — Other Ambulatory Visit: Payer: Self-pay | Admitting: Psychiatry

## 2016-01-03 ENCOUNTER — Ambulatory Visit (INDEPENDENT_AMBULATORY_CARE_PROVIDER_SITE_OTHER): Payer: Medicare HMO | Admitting: Primary Care

## 2016-01-03 ENCOUNTER — Encounter: Payer: Self-pay | Admitting: Primary Care

## 2016-01-03 ENCOUNTER — Other Ambulatory Visit: Payer: Self-pay | Admitting: Family Medicine

## 2016-01-03 VITALS — BP 136/82 | HR 65 | Temp 98.4°F | Ht 61.0 in | Wt 183.1 lb

## 2016-01-03 DIAGNOSIS — F418 Other specified anxiety disorders: Secondary | ICD-10-CM | POA: Diagnosis not present

## 2016-01-03 DIAGNOSIS — F4323 Adjustment disorder with mixed anxiety and depressed mood: Secondary | ICD-10-CM | POA: Diagnosis not present

## 2016-01-03 DIAGNOSIS — F329 Major depressive disorder, single episode, unspecified: Secondary | ICD-10-CM

## 2016-01-03 DIAGNOSIS — F419 Anxiety disorder, unspecified: Principal | ICD-10-CM

## 2016-01-03 MED ORDER — SERTRALINE HCL 100 MG PO TABS: 100.0000 mg | ORAL_TABLET | Freq: Every day | ORAL | 1 refills | Status: DC

## 2016-01-03 MED ORDER — TRAZODONE HCL 50 MG PO TABS: 50.0000 mg | ORAL_TABLET | Freq: Every evening | ORAL | 1 refills | Status: DC | PRN

## 2016-01-03 NOTE — Progress Notes (Signed)
Pre visit review using our clinic review tool, if applicable. No additional management support is needed unless otherwise documented below in the visit note. 

## 2016-01-03 NOTE — Assessment & Plan Note (Addendum)
Not currently seeing psychiatry and does not wish to return. Doesn't believe she every picked up Haldol, will call pharmacy when they open.  Given continued symptoms of anxiety and depression, will increase Zoloft to 100 mg. Refill provided for Trazodone HS. Referral placed to therapy for further treatment as I believe she will benefit. She does appear improved overall. Follow up in 2 months with PCP.

## 2016-01-03 NOTE — Progress Notes (Signed)
Subjective:    Patient ID: Amanda Hancock, female    DOB: 13-Jan-1946, 70 y.o.   MRN: [id number]  HPI  [name] is a 70 year old female who presents today for follow up of Major Depressive Disorder. She initially presented to our clinic in June 2017 with complaints of anxiety, depression, paranoia, and hallucinations. She is currently managed on Zoloft 50 mg, Trazodone 50 mg for insomnia, and haloperidol 1 mg. She followed with psychiatry in July 2017.   Since her last visit haloperidol 1 mg was added to her regimen per psychiatry as she could not afford Abilify. She doesn't believe she ever picked up the Haldol from the pharmacy. She is out of her "bedtime pill" and is needing refills. She's not since returned to the psychiatrist as she felt offended during their first and only visit. She continues to experience episodes of hallucinations, panic attacks, depression. She denies SI/HI. Her family believes her symptoms have improved since her initial visit in June 2017.  Review of Systems  Respiratory: Negative for shortness of breath.   Cardiovascular: Negative for chest pain.  Psychiatric/Behavioral: Positive for hallucinations and sleep disturbance. Negative for suicidal ideas. The patient is nervous/anxious.        Depression       Past Medical History:  Diagnosis Date  . Abnormal Pap smear    ASCUS  . Anxiety   . Chronic kidney disease    KIDNEYSTONES  . Depression   . Elevated blood pressure (not hypertension)   . Vaginal Pap smear with ASC-US and positive HPV 12/20/2010  . Vaginal Pap smear, abnormal      Social History   Social History  . Marital status: Married    Spouse name: N/A  . Number of children: N/A  . Years of education: N/A   Occupational History  . Not on file.   Social History Main Topics  . Smoking status: Never Smoker  . Smokeless tobacco: Never Used  . Alcohol use No  . Drug use: No  . Sexual activity: Not Currently   Other Topics Concern  .  Not on file   Social History Narrative   Does not have a living will.   Would desire CPR and life support if not futile.    Past Surgical History:  Procedure Laterality Date  . ABDOMINAL HYSTERECTOMY    . TUBAL LIGATION      Family History  Problem Relation Age of Onset  . Parkinsonism Mother   . Dementia Mother   . Heart attack Paternal Grandfather   . Diabetes Maternal Grandmother   . Breast cancer Maternal Aunt   . Breast cancer Paternal Aunt   . Dementia Cousin     Allergies  Allergen Reactions  . Codeine Nausea And Vomiting and Other (See Comments)    headache  . Hctz [Hydrochlorothiazide]     Rash     Current Outpatient Prescriptions on File Prior to Visit  Medication Sig Dispense Refill  . amLODipine-benazepril (LOTREL) 5-20 MG capsule TAKE ONE CAPSULE BY MOUTH ONCE DAILY 90 capsule 3  . aspirin 81 MG tablet Take 81 mg by mouth daily.    . Cholecalciferol ([employer]S VITAMIN D) 2000 UNITS CAPS Take 1 capsule (2,000 Units total) by mouth daily. 30 each   . haloperidol (HALDOL) 1 MG tablet 1 mg po qhs 30 tablet 1   No current facility-administered medications on file prior to visit.     BP 136/82   Pulse 65  Temp 98.4 F (36.9 C) (Oral)   Ht 5\' 1"  (1.549 m)   Wt 183 lb 1.9 oz (83.1 kg)   SpO2 95%   BMI 34.60 kg/m    Objective:   Physical Exam  Constitutional: She appears well-nourished.  Neck: Neck supple.  Cardiovascular: Normal rate and regular rhythm.   Pulmonary/Chest: Effort normal and breath sounds normal.  Skin: Skin is warm and dry.  Psychiatric: She has a normal mood and affect.  Appears improved overall          Assessment & Plan:

## 2016-01-03 NOTE — Patient Instructions (Signed)
We've increased your dose of Zoloft to 100 mg. Take 1 tablet by mouth once daily, everyday.  I sent refills for Trazodone 50 mg tablets for sleep. Take 1 tablet by mouth at bedtime as needed for sleep.  Please schedule a follow up appointment with myself or Dr. Dayton MartesAron in 2 months for re-evaluation or sooner if needed.  It was a pleasure to see you today!

## 2016-01-19 ENCOUNTER — Ambulatory Visit (INDEPENDENT_AMBULATORY_CARE_PROVIDER_SITE_OTHER): Payer: Medicare HMO | Admitting: Psychology

## 2016-01-19 DIAGNOSIS — F331 Major depressive disorder, recurrent, moderate: Secondary | ICD-10-CM | POA: Diagnosis not present

## 2016-02-01 ENCOUNTER — Telehealth: Payer: Self-pay | Admitting: Family Medicine

## 2016-02-01 ENCOUNTER — Telehealth: Payer: Self-pay | Admitting: Primary Care

## 2016-02-01 ENCOUNTER — Other Ambulatory Visit: Payer: Self-pay | Admitting: Primary Care

## 2016-02-01 DIAGNOSIS — F329 Major depressive disorder, single episode, unspecified: Secondary | ICD-10-CM

## 2016-02-01 DIAGNOSIS — R443 Hallucinations, unspecified: Secondary | ICD-10-CM

## 2016-02-01 DIAGNOSIS — F419 Anxiety disorder, unspecified: Secondary | ICD-10-CM

## 2016-02-01 NOTE — Telephone Encounter (Signed)
Amanda Hancock, I received an e-mail from [name] in regards to this patient as I referred her to therapy. I've seen her a few times for anxiety and hallucinations. I sent her to psychiatry originally (Dr. Garnetta BuddyFaheem), but she didn't mesh well. I highly agree that she needs psychiatry evaluation. Let me know if you need anything from me.

## 2016-02-01 NOTE — Telephone Encounter (Signed)
Received a call from Cyndee Brightlyhristina Miller daughter of Amanda Hancock, she has a DPR on file to speak to us. Daughter said Dr Laymond PurserPerrin suggested that the patient see a Psychiatrist and possibly get a brain scan. Dr Laymond PurserPerrin was supposed to reach out and talk to you about this.

## 2016-02-01 NOTE — Telephone Encounter (Signed)
I will leave this for Dr. Dayton MartesAron upon her return

## 2016-02-02 NOTE — Telephone Encounter (Signed)
Noted  

## 2016-02-02 NOTE — Telephone Encounter (Signed)
Yes I spoke with Jae DireKate who has been in communication with Dr. Laymond PurserPerrin.  I have also placed the referral for psychiatry.

## 2016-02-02 NOTE — Telephone Encounter (Signed)
Thanks so much for taking such great care of this patient.  I will place the referral.  Thanks again for all you do!

## 2016-02-17 ENCOUNTER — Other Ambulatory Visit: Payer: Self-pay | Admitting: Family Medicine

## 2016-02-20 ENCOUNTER — Other Ambulatory Visit: Payer: Self-pay | Admitting: Primary Care

## 2016-02-20 DIAGNOSIS — Z1231 Encounter for screening mammogram for malignant neoplasm of breast: Secondary | ICD-10-CM

## 2016-03-16 ENCOUNTER — Other Ambulatory Visit: Payer: Self-pay | Admitting: Primary Care

## 2016-03-16 DIAGNOSIS — F419 Anxiety disorder, unspecified: Principal | ICD-10-CM

## 2016-03-16 DIAGNOSIS — F329 Major depressive disorder, single episode, unspecified: Secondary | ICD-10-CM

## 2016-03-19 ENCOUNTER — Other Ambulatory Visit: Payer: Self-pay | Admitting: Psychiatry

## 2016-03-19 NOTE — Telephone Encounter (Signed)
Ok to refill? Electronically refill request for   sertraline (ZOLOFT) 100 MG tablet  Last prescribed and seen on 01/03/2016.

## 2016-03-20 ENCOUNTER — Other Ambulatory Visit: Payer: Self-pay | Admitting: Psychiatry

## 2016-03-27 ENCOUNTER — Ambulatory Visit
Admission: RE | Admit: 2016-03-27 | Discharge: 2016-03-27 | Disposition: A | Discharge status: Home / Self Care | Payer: PPO | Source: Ambulatory Visit | Attending: Primary Care | Admitting: Primary Care

## 2016-03-27 DIAGNOSIS — Z1231 Encounter for screening mammogram for malignant neoplasm of breast: Secondary | ICD-10-CM | POA: Diagnosis not present

## 2016-04-30 ENCOUNTER — Telehealth: Payer: Self-pay | Admitting: Family Medicine

## 2016-04-30 NOTE — Telephone Encounter (Signed)
Will route to PCP 

## 2016-04-30 NOTE — Telephone Encounter (Signed)
Pt daughter called -  Pt is supposed to be getting a brain scan and then follow up with psychiatry.  cb number is [phone]

## 2016-05-19 ENCOUNTER — Other Ambulatory Visit: Payer: Self-pay | Admitting: Primary Care

## 2016-05-19 ENCOUNTER — Other Ambulatory Visit: Payer: Self-pay | Admitting: Family Medicine

## 2016-05-19 DIAGNOSIS — F329 Major depressive disorder, single episode, unspecified: Secondary | ICD-10-CM

## 2016-05-19 DIAGNOSIS — F419 Anxiety disorder, unspecified: Principal | ICD-10-CM

## 2016-05-20 NOTE — Telephone Encounter (Signed)
Ok to refill? Electronically refill request for trazodone (DESYREL) 50 MG tablet. Last prescribed and seen oon 01/03/2016 by Jae DireKate. Dr Elmer SowAron's patient

## 2016-06-25 ENCOUNTER — Other Ambulatory Visit: Payer: Self-pay | Admitting: Family Medicine

## 2016-06-25 DIAGNOSIS — F329 Major depressive disorder, single episode, unspecified: Secondary | ICD-10-CM

## 2016-06-25 DIAGNOSIS — F32A Depression, unspecified: Secondary | ICD-10-CM

## 2016-06-25 DIAGNOSIS — F419 Anxiety disorder, unspecified: Principal | ICD-10-CM

## 2016-07-10 DIAGNOSIS — F5105 Insomnia due to other mental disorder: Secondary | ICD-10-CM | POA: Diagnosis not present

## 2016-07-10 DIAGNOSIS — F333 Major depressive disorder, recurrent, severe with psychotic symptoms: Secondary | ICD-10-CM | POA: Diagnosis not present

## 2016-07-11 ENCOUNTER — Other Ambulatory Visit: Payer: Self-pay | Admitting: Psychiatry

## 2016-07-11 DIAGNOSIS — F0280 Dementia in other diseases classified elsewhere without behavioral disturbance: Secondary | ICD-10-CM

## 2016-07-11 DIAGNOSIS — F333 Major depressive disorder, recurrent, severe with psychotic symptoms: Secondary | ICD-10-CM | POA: Diagnosis not present

## 2016-07-11 DIAGNOSIS — F5105 Insomnia due to other mental disorder: Secondary | ICD-10-CM | POA: Diagnosis not present

## 2016-07-11 DIAGNOSIS — G3183 Dementia with Lewy bodies: Principal | ICD-10-CM

## 2016-07-14 ENCOUNTER — Encounter: Payer: Self-pay | Admitting: Intensive Care

## 2016-07-14 ENCOUNTER — Emergency Department
Admission: EM | Admit: 2016-07-14 | Discharge: 2016-07-14 | Disposition: A | Discharge status: Home / Self Care | Payer: PPO | Attending: Emergency Medicine | Admitting: Emergency Medicine

## 2016-07-14 DIAGNOSIS — Z7982 Long term (current) use of aspirin: Secondary | ICD-10-CM | POA: Insufficient documentation

## 2016-07-14 DIAGNOSIS — N189 Chronic kidney disease, unspecified: Secondary | ICD-10-CM | POA: Insufficient documentation

## 2016-07-14 DIAGNOSIS — I129 Hypertensive chronic kidney disease with stage 1 through stage 4 chronic kidney disease, or unspecified chronic kidney disease: Secondary | ICD-10-CM | POA: Diagnosis not present

## 2016-07-14 DIAGNOSIS — I10 Essential (primary) hypertension: Secondary | ICD-10-CM

## 2016-07-14 DIAGNOSIS — R51 Headache: Secondary | ICD-10-CM | POA: Diagnosis not present

## 2016-07-14 DIAGNOSIS — Z79899 Other long term (current) drug therapy: Secondary | ICD-10-CM | POA: Diagnosis not present

## 2016-07-14 HISTORY — DX: Essential (primary) hypertension: I10

## 2016-07-14 MED ORDER — CLONIDINE HCL 0.1 MG PO TABS: 0.1000 mg | ORAL_TABLET | Freq: Two times a day (BID) | ORAL | 0 refills | Status: DC

## 2016-07-14 MED ORDER — CLONIDINE HCL 0.1 MG PO TABS
0.1000 mg | ORAL_TABLET | Freq: Once | ORAL | Status: AC
  Administered 2016-07-14: 0.1 mg via ORAL
  Filled 2016-07-14: qty 1

## 2016-07-14 NOTE — ED Provider Notes (Signed)
[facility] Emergency Department Provider Note  ____________________________________________   First MD Initiated Contact with Patient 07/14/16 1051     (approximate)  I have reviewed the triage vital signs and the nursing notes.   HISTORY  Chief Complaint Hypertension   HPI Amanda Hancock is a 71 y.o. female who presents to the emergency department for evaluation of hypertension. She states that she has had some intermittent dizziness and headache and checked her BP at home. It was "high" and so she went to the fire department and it "was like 240/120." She denies chest pain/pressure or vision changes.She states that she informed her doctor about the reading, but no changes were made to her medication with the exception of her sleeping pill. She denies dizziness or headache at this time.  Past Medical History:  Diagnosis Date  . Abnormal Pap smear    ASCUS  . Anxiety   . Chronic kidney disease    KIDNEYSTONES  . Depression   . Elevated blood pressure (not hypertension)   . Hypertension   . Vaginal Pap smear with ASC-US and positive HPV 12/20/2010  . Vaginal Pap smear, abnormal     Patient Active Problem List   Diagnosis Date Noted  . Acute upper respiratory infection 07/25/2015  . Medicare annual wellness visit, subsequent 01/18/2015  . Hypercalcemia 10/09/2013  . Vitamin D deficiency 10/09/2013  . Vaginal Pap smear with LGSIL, positive HRHPV Type 16 03/16/2013  . Adjustment disorder with mixed anxiety and depressed mood 06/30/2012  . HTN (hypertension) 01/28/2011    Past Surgical History:  Procedure Laterality Date  . ABDOMINAL HYSTERECTOMY    . TUBAL LIGATION      Prior to Admission medications   Medication Sig Start Date End Date Taking? Authorizing Provider  amLODipine-benazepril (LOTREL) 5-20 MG capsule TAKE ONE CAPSULE BY MOUTH ONCE DAILY 02/19/16   Dianne Dun, MD  aspirin 81 MG tablet Take 81 mg by mouth daily.    Historical  Provider, MD  Cholecalciferol ([employer]S VITAMIN D) 2000 UNITS CAPS Take 1 capsule (2,000 Units total) by mouth daily. 10/09/13   Eustaquio Boyden, MD  cloNIDine (CATAPRES) 0.1 MG tablet Take 1 tablet (0.1 mg total) by mouth 2 (two) times daily. 07/14/16 07/14/17  Chinita Pester, FNP  haloperidol (HALDOL) 1 MG tablet 1 mg po qhs 10/27/15   Brandy Hale, MD  sertraline (ZOLOFT) 100 MG tablet TAKE 1 TABLET BY MOUTH ONCE DAILY 06/25/16   Dianne Dun, MD  traZODone (DESYREL) 50 MG tablet TAKE ONE TABLET BY MOUTH AT BEDTIME AS NEEDED FOR SLEEP 05/20/16   Dianne Dun, MD    Allergies Codeine and Hctz [hydrochlorothiazide]  Family History  Problem Relation Age of Onset  . Parkinsonism Mother   . Dementia Mother   . Heart attack Paternal Grandfather   . Diabetes Maternal Grandmother   . Dementia Cousin   . Breast cancer Maternal Aunt   . Breast cancer Paternal Aunt     Social History Social History  Substance Use Topics  . Smoking status: Never Smoker  . Smokeless tobacco: Never Used  . Alcohol use No    Review of Systems  Constitutional: No fever/chills. Positive for occasional dizziness. Eyes: No visual changes. ENT: No sore throat. Cardiovascular: Denies chest pain. Respiratory: Denies shortness of breath. Gastrointestinal: No abdominal pain.  No nausea, no vomiting.  No diarrhea.  No constipation. Genitourinary: Negative for dysuria. Musculoskeletal: Negative for back pain. Skin: Negative for rash. Neurological: Negative  for headaches, focal weakness or numbness.   ____________________________________________   PHYSICAL EXAM:  VITAL SIGNS: ED Triage Vitals  Enc Vitals Group     BP 07/14/16 1028 (!) 218/73     Pulse Rate 07/14/16 1028 68     Resp 07/14/16 1028 18     Temp 07/14/16 1028 98.9 F (37.2 C)     Temp Source 07/14/16 1028 Oral     SpO2 07/14/16 1028 98 %     Weight 07/14/16 1029 180 lb (81.6 kg)     Height 07/14/16 1029  (1.549 m)     Head Circumference --       Peak Flow --      Pain Score 07/14/16 1028 4     Pain Loc --      Pain Edu? --      Excl. in GC? --     Constitutional: Alert and oriented. Well appearing and in no acute distress. Eyes: Conjunctivae are normal. EOMI. Head: Atraumatic. Nose: No congestion/rhinnorhea. Mouth/Throat: Mucous membranes are moist.  Oropharynx non-erythematous. Neck: No stridor.   Cardiovascular: Normal rate, regular rhythm. Grossly normal heart sounds.  Good peripheral circulation. Respiratory: Normal respiratory effort.  No retractions. Lungs CTAB. Musculoskeletal: No lower extremity tenderness nor edema.  No joint effusions. Neurologic:  Normal speech and language. No gross focal neurologic deficits are appreciated. No gait instability. Skin:  Skin is warm, dry and intact. No rash noted. Psychiatric: Mood and affect are normal. Speech and behavior are normal.  ____________________________________________   LABS (all labs ordered are listed, but only abnormal results are displayed)  Labs Reviewed - No data to display ____________________________________________  EKG   ____________________________________________  RADIOLOGY   ____________________________________________   PROCEDURES  Procedure(s) performed: None  Procedures  Critical Care performed: No  ____________________________________________   INITIAL IMPRESSION / ASSESSMENT AND PLAN / ED COURSE  Pertinent labs & imaging results that were available during my care of the patient were reviewed by me and considered in my medical decision making (see chart for details).  71 year old female presenting to the emergency department for evaluation of hypertension. She is asymptomatic at this time. She was given 0.1 mg of clonidine while in the emergency department with subtle decrease in the systolic pressure. She will be given a prescription for the same and advised to follow-up with her primary care provider for medication changes  or additional refills of clonidine. She was encouraged to return to the emergency department for any chest pain, shortness of breath, or other symptoms of concern if she is unable to see her primary care provider. ____________________________________________   FINAL CLINICAL IMPRESSION(S) / ED DIAGNOSES  Final diagnoses:  Hypertension, unspecified type      NEW MEDICATIONS STARTED DURING THIS VISIT:  Discharge Medication List as of 07/14/2016 12:22 PM    START taking these medications   Details  cloNIDine (CATAPRES) 0.1 MG tablet Take 1 tablet (0.1 mg total) by mouth 2 (two) times daily., Starting Sun 07/14/2016, Until Mon 07/14/2017, Print         Note:  This document was prepared using Dragon voice recognition software and may include unintentional dictation errors.    Chinita Pester, FNP 07/14/16 1710    Minna Antis, MD 07/14/16 2257

## 2016-07-14 NOTE — ED Triage Notes (Signed)
Patient presents to ER with c/o High blood pressure. Denies blurry or double vision. Reports having headache in triage. Ambulated in with no acute distress.

## 2016-07-14 NOTE — ED Notes (Signed)
See triage note   States she has had some elevated b/p recently  States she informed her PCP and they did not make any changes to her meds  States she does have intermittent headache

## 2016-07-17 ENCOUNTER — Encounter: Payer: Self-pay | Admitting: Family Medicine

## 2016-07-17 ENCOUNTER — Ambulatory Visit (INDEPENDENT_AMBULATORY_CARE_PROVIDER_SITE_OTHER): Payer: PPO | Admitting: Family Medicine

## 2016-07-17 VITALS — BP 150/80 | HR 52 | Wt 185.1 lb

## 2016-07-17 DIAGNOSIS — I10 Essential (primary) hypertension: Secondary | ICD-10-CM | POA: Diagnosis not present

## 2016-07-17 DIAGNOSIS — R4189 Other symptoms and signs involving cognitive functions and awareness: Secondary | ICD-10-CM | POA: Diagnosis not present

## 2016-07-17 DIAGNOSIS — R42 Dizziness and giddiness: Secondary | ICD-10-CM | POA: Insufficient documentation

## 2016-07-17 LAB — CBC WITH DIFFERENTIAL/PLATELET
BASOS PCT: 0.2 % (ref 0.0–3.0)
Basophils Absolute: 0 10*3/uL (ref 0.0–0.1)
EOS ABS: 0.1 10*3/uL (ref 0.0–0.7)
EOS PCT: 1.6 % (ref 0.0–5.0)
HEMATOCRIT: 39.4 % (ref 36.0–46.0)
HEMOGLOBIN: 13.4 g/dL (ref 12.0–15.0)
LYMPHS PCT: 20.9 % (ref 12.0–46.0)
Lymphs Abs: 1.3 10*3/uL (ref 0.7–4.0)
MCHC: 34.1 g/dL (ref 30.0–36.0)
MCV: 86.4 fl (ref 78.0–100.0)
MONOS PCT: 7 % (ref 3.0–12.0)
Monocytes Absolute: 0.4 10*3/uL (ref 0.1–1.0)
Neutro Abs: 4.4 10*3/uL (ref 1.4–7.7)
Neutrophils Relative %: 70.3 % (ref 43.0–77.0)
Platelets: 171 10*3/uL (ref 150.0–400.0)
RBC: 4.56 Mil/uL (ref 3.87–5.11)
RDW: 13.9 % (ref 11.5–15.5)
WBC: 6.3 10*3/uL (ref 4.0–10.5)

## 2016-07-17 LAB — COMPREHENSIVE METABOLIC PANEL
ALT: 11 U/L (ref 0–35)
AST: 13 U/L (ref 0–37)
Albumin: 3.9 g/dL (ref 3.5–5.2)
Alkaline Phosphatase: 95 U/L (ref 39–117)
BILIRUBIN TOTAL: 0.6 mg/dL (ref 0.2–1.2)
BUN: 15 mg/dL (ref 6–23)
CALCIUM: 10.5 mg/dL (ref 8.4–10.5)
CHLORIDE: 107 meq/L (ref 96–112)
CO2: 30 meq/L (ref 19–32)
Creatinine, Ser: 0.86 mg/dL (ref 0.40–1.20)
GFR: 69.18 mL/min (ref >60.00)
Glucose, Bld: 101 mg/dL — ABNORMAL HIGH (ref 70–99)
POTASSIUM: 4.6 meq/L (ref 3.5–5.1)
Sodium: 140 mEq/L (ref 135–145)
Total Protein: 6.6 g/dL (ref 6.0–8.3)

## 2016-07-17 LAB — TSH: TSH: 0.61 u[IU]/mL (ref 0.35–4.50)

## 2016-07-17 NOTE — Progress Notes (Signed)
Subjective:   Patient ID: Amanda Hancock, female    DOB: May 27, 1945, 71 y.o.   MRN: [id number]  Amanda Hancock is a pleasant 71 y.o. year old female who presents to clinic today with ER follow up  on 07/17/2016  HPI:  ER notes reviewed.  Was seen in the ER on 07/14/16. Notes reviewed.  Went to fire department prior to going to the ER because she felt dizzy. Blood pressure was 2410/110 at the fire department.  In ER, no labs or imaging done.  Clonidine 0.1 mg twice daily added to her current rxs.  Dizziness has improved.  Of note, has been seeing Dr. Bethanie Dicker and medications recently changed this week.  Note from last week as below:   I have started her on a low dose Seroquel and have increased the Zoloft. I am highly suspicious of possibly Lewy Body Dementia given the triad of visual and olfactory hallucinations, abnormal gait and cognitive decline.She describes visual hallucinations that are very characteristic of Lewy Body Dementia. I have referred her as well to Neurology for their input as well and will try to get MRI Brain. I appreciate the referral.   Current Outpatient Prescriptions on File Prior to Visit  Medication Sig Dispense Refill  . aspirin 81 MG tablet Take 81 mg by mouth daily.    . Cholecalciferol ([employer]S VITAMIN D) 2000 UNITS CAPS Take 1 capsule (2,000 Units total) by mouth daily. 30 each   . cloNIDine (CATAPRES) 0.1 MG tablet Take 1 tablet (0.1 mg total) by mouth 2 (two) times daily. 14 tablet 0  . sertraline (ZOLOFT) 100 MG tablet TAKE 1 TABLET BY MOUTH ONCE DAILY 90 tablet 0  . amLODipine-benazepril (LOTREL) 5-20 MG capsule TAKE ONE CAPSULE BY MOUTH ONCE DAILY (Patient not taking: Reported on 07/17/2016) 90 capsule 1  . haloperidol (HALDOL) 1 MG tablet 1 mg po qhs (Patient not taking: Reported on 07/17/2016) 30 tablet 1  . traZODone (DESYREL) 50 MG tablet TAKE ONE TABLET BY MOUTH AT BEDTIME AS NEEDED FOR SLEEP (Patient not taking: Reported on 07/17/2016) 90 tablet 1   No  current facility-administered medications on file prior to visit.     Allergies  Allergen Reactions  . Codeine Nausea And Vomiting and Other (See Comments)    headache  . Hctz [Hydrochlorothiazide]     Rash     Past Medical History:  Diagnosis Date  . Abnormal Pap smear    ASCUS  . Anxiety   . Chronic kidney disease    KIDNEYSTONES  . Depression   . Elevated blood pressure (not hypertension)   . Hypertension   . Vaginal Pap smear with ASC-US and positive HPV 12/20/2010  . Vaginal Pap smear, abnormal     Past Surgical History:  Procedure Laterality Date  . ABDOMINAL HYSTERECTOMY    . TUBAL LIGATION      Family History  Problem Relation Age of Onset  . Parkinsonism Mother   . Dementia Mother   . Heart attack Paternal Grandfather   . Diabetes Maternal Grandmother   . Dementia Cousin   . Breast cancer Maternal Aunt   . Breast cancer Paternal Aunt     Social History   Social History  . Marital status: Married    Spouse name: N/A  . Number of children: N/A  . Years of education: N/A   Occupational History  . Not on file.   Social History Main Topics  . Smoking status: Never Smoker  . Smokeless  tobacco: Never Used  . Alcohol use No  . Drug use: No  . Sexual activity: Not Currently   Other Topics Concern  . Not on file   Social History Narrative   Does not have a living will.   Would desire CPR and life support if not futile.   The PMH, PSH, Social History, Family History, Medications, and allergies have been reviewed in [facility], and have been updated if relevant.   Review of Systems  Constitutional: Negative.   Respiratory: Negative.   Cardiovascular: Negative.   Gastrointestinal: Negative.   Neurological: Positive for speech difficulty. Negative for dizziness, facial asymmetry, weakness, light-headedness and headaches.  Hematological: Negative.   Psychiatric/Behavioral: Positive for confusion and decreased concentration.  All other systems reviewed  and are negative.      Objective:    BP (!) 150/80 (BP Location: Right Arm, Patient Position: Sitting, Cuff Size: Normal)   Pulse (!) 52   Wt 185 lb 1.9 oz (84 kg)   BMI 34.98 kg/m    Physical Exam  Constitutional: She is oriented to person, place, and time. She appears well-developed and well-nourished. No distress.  HENT:  Head: Normocephalic and atraumatic.  Eyes: Conjunctivae are normal.  Cardiovascular: Normal rate.   Pulmonary/Chest: Effort normal.  Musculoskeletal: Normal range of motion.  Neurological: She is alert and oriented to person, place, and time. No cranial nerve deficit.  Skin: Skin is warm and dry. She is not diaphoretic.  Psychiatric: She has a normal mood and affect. Her behavior is normal. Judgment and thought content normal.  Nursing note and vitals reviewed.         Assessment & Plan:   Essential hypertension  Dizziness - Plan: Comprehensive metabolic panel, TSH, CBC with Differential/Platelet  Cognitive decline No Follow-up on file.

## 2016-07-17 NOTE — Assessment & Plan Note (Signed)
Referred to neuro by psychiatry. Awaiting input from neurology.

## 2016-07-17 NOTE — Assessment & Plan Note (Signed)
Improved with clonidine. No changes made. Recent adjustment in psych rxs- ? If this is playing a roll. Labs were not done in ER- will check some blood work today. The patient indicates understanding of these issues and agrees with the plan.

## 2016-07-20 DIAGNOSIS — F5105 Insomnia due to other mental disorder: Secondary | ICD-10-CM | POA: Diagnosis not present

## 2016-07-20 DIAGNOSIS — F333 Major depressive disorder, recurrent, severe with psychotic symptoms: Secondary | ICD-10-CM | POA: Diagnosis not present

## 2016-07-27 ENCOUNTER — Other Ambulatory Visit: Payer: Self-pay | Admitting: Family Medicine

## 2016-07-29 MED ORDER — CLONIDINE HCL 0.1 MG PO TABS: 0.1000 mg | ORAL_TABLET | Freq: Two times a day (BID) | ORAL | 0 refills | Status: DC

## 2016-07-29 NOTE — Telephone Encounter (Signed)
Amanda Hancock (DPR signed) request refills on amlodipine-benazepril 5-20 mg and the Clonidine 0.1 mg that was started at the hospital ED; pt had hospital f/u on 07/17/16. Amanda Hancock request cb when refilled to [facility] [address]

## 2016-08-01 ENCOUNTER — Other Ambulatory Visit: Payer: Self-pay

## 2016-08-01 NOTE — Telephone Encounter (Signed)
Patients daughter called states pt is out of her BP meds since last Friday, her bp is running high 188/90's. She said [facility] would not refill her meds until the 22nd. They need new instructions to take both BID. Ok to send?  Pt doesn't remember what was discussed during her visit.

## 2016-08-02 MED ORDER — CLONIDINE HCL 0.1 MG PO TABS: 0.1000 mg | ORAL_TABLET | Freq: Two times a day (BID) | ORAL | 0 refills | Status: DC

## 2016-08-02 MED ORDER — AMLODIPINE BESY-BENAZEPRIL HCL 5-20 MG PO CAPS: 1.0000 | ORAL_CAPSULE | Freq: Two times a day (BID) | ORAL | 0 refills | Status: DC

## 2016-08-05 ENCOUNTER — Telehealth: Payer: Self-pay

## 2016-08-05 NOTE — Telephone Encounter (Signed)
PLEASE NOTE: All timestamps contained within this report are represented as [place] Standard Time. CONFIDENTIALTY NOTICE: This fax transmission is intended only for the addressee. It contains information that is legally privileged, confidential or otherwise protected from use or disclosure. If you are not the intended recipient, you are strictly prohibited from reviewing, disclosing, copying using or disseminating any of this information or taking any action in reliance on or regarding this information. If you have received this fax in error, please notify us immediately by telephone so that we can arrange for its return to us. Phone: [phone], Toll-Free: [phone], Fax: [phone] Page: 1 of 3 Call Id: [id number] [facility] - Client >>>Contains Verbal Order - Signature Required<<< TELEPHONE ADVICE RECORD [facility] Patient Name: Amanda StainsDIANE Hancock Gender: Female DOB: 1946-02-06 Age: [age] Y 9 M 25 D Return Phone Number: [phone] (Primary) City/State/Zip: [place] [id number] Client [facility] - Client [facility] Site [facility] - Night Physician Ruthe MannanAron, Talia - MD Who Is Calling Patient / Member / Family / Caregiver Call Type Triage / Clinical Caller Name Sheldon SilvanChristy Miller Relationship To Patient Daughter Return Phone Number [phone] (Primary) Chief Complaint BLOOD PRESSURE HIGH - Systolic (top number) 200 or greater (with symptoms) Reason for Call Medication Question / Request Initial Comment Caller mom is out of BP meds and her BP is 230/105 she has been calling since Monday and no one is calling her back Additional Comment [facility] [phone] Nurse Assessment Nurse: Harlon FlorWhitaker, RN, Darl PikesSusan Date/Time (Eastern Time): 08/02/2016 6:15:13 PM Confirm and document reason for call. If symptomatic, describe symptoms. ---Caller mom is out of BP meds and her BP is 230/105 she has been calling since Monday  and no one is calling her back Seen 2 wks ago in ED ; for severely HTN. RX HTN med BID for a wk supply. PCP was notified of need for refill. Does the PT have any chronic conditions? (i.e. diabetes, asthma, etc.) ---Yes List chronic conditions. ---HTN Louis Body dementia Aspirin Zoloft B 12 Guidelines Guideline Title Affirmed Question High Blood Pressure [1] Systolic BP >= 160 OR Diastolic >= 100 AND [2] cardiac or neurologic symptoms (e.g., chest pain, difficulty breathing, unsteady gait, blurred vision) Disp. Time Lamount Cohen(Eastern Time) Disposition Final User 08/02/2016 6:23:35 PM Go to ED Now Yes Harlon FlorWhitaker, RN, Darl PikesSusan Referrals [facility] - [facility] Care Advice Given Per Guideline GO TO ED NOW: You need to be seen in the Emergency Department. Go to the ER at ___________ Hospital. Leave now. Drive carefully. * Another adult should drive. CARE ADVICE given per High Blood Pressure (Adult) guideline. * You become worse. CALL EMS 911 IF: PLEASE NOTE: All timestamps contained within this report are represented as [place] Standard Time. CONFIDENTIALTY NOTICE: This fax transmission is intended only for the addressee. It contains information that is legally privileged, confidential or otherwise protected from use or disclosure. If you are not the intended recipient, you are strictly prohibited from reviewing, disclosing, copying using or disseminating any of this information or taking any action in reliance on or regarding this information. If you have received this fax in error, please notify us immediately by telephone so that we can arrange for its return to us. Phone: [phone], Toll-Free: [phone], Fax: [phone] Page: 2 of 3 Call Id: [id number] Verbal Orders/Maintenance Medications Medication Refill Route Dosage Regime Duration Admin Instructions User Name Clonidine 0.1 mg one PO BID Yes Oral Days Dispense : # 60 no refills  Harlon Flor, RN, Clemetine Marker DoctorName Phone  DateTime Action Result/Outcome Notes Abbe Amsterdam- MD [id number] 08/02/2016 6:34:27 PM Doctor Paged Called On Call Provider - Celine Mans- MD 08/02/2016 6:35:15 PM Message Result Spoke with On Call - General Received VO to call med for pt RN advised dtr to call back for any new or worsening symptoms. PLEASE NOTE: All timestamps contained within this report are represented as [place] Standard Time. CONFIDENTIALTY NOTICE: This fax transmission is intended only for the addressee. It contains information that is legally privileged, confidential or otherwise protected from use or disclosure. If you are not the intended recipient, you are strictly prohibited from reviewing, disclosing, copying using or disseminating any of this information or taking any action in reliance on or regarding this information. If you have received this fax in error, please notify us immediately by telephone so that we can arrange for its return to [place]. Phone: [phone], Toll-Free: [phone], Fax: [phone] Page: 3 of 3 Call Id: [phone] [facility] >>>Contains Verbal Order - Signature Required<<< [address] [place], [place] [address] [phone] Fax: [phone] MEDICATION ORDER [facility] Night - Client [facility] - Night Date: 08/02/2016 From: QI Department To: Ruthe Mannan - MD Please sign the order for the approved drug(s) given by our call center nurse on your behalf. Fax to [phone] within 5 business days. Thank you. Date Lamount Cohen Time): 08/02/2016 6:01:59 PM Triage RN: Sabino Snipes, RN NAME: Amanda Hancock PHONE NUMBER: [phone] (Primary) BIRTHDATE: Mar 28, 1945 ADDRESS: CITY/STATE/ZIP: [place] [address] CALLER: Daughter NAME: Sheldon Silvan Rx Given Medication Refill Route Dosage Regime Duration Admin Instructions User Name Clonidine 0.1 mg one PO BID Yes Oral Days Dispense  : # 60 no refills Harlon Flor, RN, Darl Pikes MD Signature Date

## 2016-08-05 NOTE — Telephone Encounter (Signed)
PLEASE NOTE: All timestamps contained within this report are represented as [place] Standard Time. CONFIDENTIALTY NOTICE: This fax transmission is intended only for the addressee. It contains information that is legally privileged, confidential or otherwise protected from use or disclosure. If you are not the intended recipient, you are strictly prohibited from reviewing, disclosing, copying using or disseminating any of this information or taking any action in reliance on or regarding this information. If you have received this fax in error, please notify us immediately by telephone so that we can arrange for its return to us. Phone: [phone], Toll-Free: [phone], Fax: [phone] Page: 1 of 3 Call Id: [id number] [facility] - Client >>>Contains Verbal Order - Signature Required<<< TELEPHONE ADVICE RECORD [facility] Patient Name: Amanda StainsDIANE Loppnow Gender: Female DOB: 1945-09-19 Age: 71 Y 9 M 25 D Return Phone Number: [phone] (Primary) City/State/Zip: [place] [id number] Client [facility] - Client [facility] - Night Physician Ruthe MannanAron, Talia - MD Who Is Calling Patient / Member / Family / Caregiver Call Type Triage / Clinical Caller Name Sheldon SilvanChristy Miller Relationship To Patient Daughter Return Phone Number [phone] (Primary) Chief Complaint BLOOD PRESSURE HIGH - Systolic (top number) 200 or greater (with symptoms) Reason for Call Medication Question / Request Initial Comment Caller mom is out of BP meds and her BP is 230/105 she has been calling since Monday and no one is calling her back Additional Comment [facility] [phone] Nurse Assessment Nurse: Harlon FlorWhitaker, RN, Darl PikesSusan Date/Time (Eastern Time): 08/02/2016 6:15:13 PM Confirm and document reason for call. If symptomatic, describe symptoms. ---Caller mom is out of BP meds and her BP is 230/105 she has been calling since Monday  and no one is calling her back Seen 2 wks ago in ED ; for severely HTN. RX HTN med BID for a wk supply. PCP was notified of need for refill. Does the PT have any chronic conditions? (i.e. diabetes, asthma, etc.) ---Yes List chronic conditions. ---HTN Louis Body dementia Aspirin Zoloft B 12 Guidelines Guideline Title Affirmed Question High Blood Pressure [1] Systolic BP >= 160 OR Diastolic >= 100 AND [2] cardiac or neurologic symptoms (e.g., chest pain, difficulty breathing, unsteady gait, blurred vision) Disp. Time Lamount Cohen(Eastern Time) Disposition Final User 08/02/2016 6:23:35 PM Go to ED Now Yes Harlon FlorWhitaker, RN, Darl PikesSusan Referrals [facility] - ED Care Advice Given Per Guideline GO TO ED NOW: You need to be seen in the Emergency Department. Go to the ER at ___________ Hospital. Leave now. Drive carefully. * Another adult should drive. CARE ADVICE given per High Blood Pressure (Adult) guideline. * You become worse. CALL EMS 911 IF: PLEASE NOTE: All timestamps contained within this report are represented as [place] Standard Time. CONFIDENTIALTY NOTICE: This fax transmission is intended only for the addressee. It contains information that is legally privileged, confidential or otherwise protected from use or disclosure. If you are not the intended recipient, you are strictly prohibited from reviewing, disclosing, copying using or disseminating any of this information or taking any action in reliance on or regarding this information. If you have received this fax in error, please notify us immediately by telephone so that we can arrange for its return to us. Phone: [phone], Toll-Free: [phone], Fax: [phone] Page: 2 of 3 Call Id: [id number] Verbal Orders/Maintenance Medications Medication Refill Route Dosage Regime Duration Admin Instructions User Name Clonidine 0.1 mg one PO BID Yes Oral Days Dispense : # 60 no refills  Harlon Flor, RN, Clemetine Marker DoctorName Phone  DateTime Action Result/Outcome Notes Abbe Amsterdam- MD [id number] 08/02/2016 6:34:27 PM Doctor Paged Called On Call Provider - Celine Mans- MD 08/02/2016 6:35:15 PM Message Result Spoke with On Call - General Received VO to call med for pt RN advised dtr to call back for any new or worsening symptoms. Abbe Amsterdam- MD [id number] 08/02/2016 7:23:54 PM Doctor Paged Called On Call Provider - Celine Mans- MD 08/02/2016 7:25:51 PM Message Result Spoke with On Call - General Making sure the pt will be starting back on the Clonidine tonight . left message for the pt and dtr . PLEASE NOTE: All timestamps contained within this report are represented as [place] Standard Time. CONFIDENTIALTY NOTICE: This fax transmission is intended only for the addressee. It contains information that is legally privileged, confidential or otherwise protected from use or disclosure. If you are not the intended recipient, you are strictly prohibited from reviewing, disclosing, copying using or disseminating any of this information or taking any action in reliance on or regarding this information. If you have received this fax in error, please notify us immediately by telephone so that we can arrange for its return to [place]. Phone: [phone], Toll-Free: [phone], Fax: [phone] Page: 3 of 3 Call Id: [phone] [facility] >>>Contains Verbal Order - Signature Required<<< [address] [place], [place] [address] [phone] Fax: [phone] MEDICATION ORDER [facility] Night - Client [facility] - Night Date: 08/02/2016 From: QI Department To: Ruthe Mannan - MD Please sign the order for the approved drug(s) given by our call center nurse on your behalf. Fax to [phone] within 5 business days. Thank you. Date Lamount Cohen Time): 08/02/2016 6:01:59 PM Triage RN: Sabino Snipes, RN NAME: Amanda Hancock PHONE NUMBER: [phone] (Primary) BIRTHDATE: 1945/06/17 ADDRESS: CITY/STATE/ZIP: [place] [address] CALLER: Daughter NAME: Sheldon Silvan Rx Given Medication Refill Route Dosage Regime Duration Admin Instructions User Name Clonidine 0.1 mg one PO BID Yes Oral Days Dispense : # 60 no refills Harlon Flor, RN, Darl Pikes MD Signature Date

## 2016-08-14 ENCOUNTER — Telehealth: Payer: Self-pay | Admitting: Family Medicine

## 2016-08-14 DIAGNOSIS — F333 Major depressive disorder, recurrent, severe with psychotic symptoms: Secondary | ICD-10-CM | POA: Diagnosis not present

## 2016-08-14 DIAGNOSIS — F5105 Insomnia due to other mental disorder: Secondary | ICD-10-CM | POA: Diagnosis not present

## 2016-08-14 NOTE — Telephone Encounter (Signed)
Trula OreChristina called requesting a call to discuss Vance. Can reach her at [phone].

## 2016-08-15 NOTE — Telephone Encounter (Signed)
Per DPR, left detail message of Kate's comments for patient or patient's daughter to call back if have questions on information.

## 2016-08-15 NOTE — Telephone Encounter (Signed)
Please notify patient and Amanda Hancock that Dr. Maryruth BunKapur has mostly communicated with Dr. Dayton MartesAron. Dr. Maryruth BunKapur did send me and Dr. Dayton MartesAron a message saying that she recommended neurology evaluation. Looks like Dr. Dayton MartesAron saw her in early May this year and explained this during their visit. I don't have any further information.

## 2016-08-15 NOTE — Telephone Encounter (Signed)
Called Trula OreChristina back she said she specifically requested a call back from Mayra ReelKate Clark.   She said Dr. Maryruth BunKapur has been talking with Jae DireKate about patient.

## 2016-08-15 NOTE — Telephone Encounter (Signed)
Spoken to patient's daughter and she stated that she would like patient to see Jae DireKate regarding patient's blood pressure. Scheduled follow up as requested.

## 2016-08-20 ENCOUNTER — Encounter: Payer: Self-pay | Admitting: Primary Care

## 2016-08-20 ENCOUNTER — Ambulatory Visit (INDEPENDENT_AMBULATORY_CARE_PROVIDER_SITE_OTHER): Payer: PPO | Admitting: Primary Care

## 2016-08-20 VITALS — BP 156/78 | HR 50 | Temp 98.0°F | Ht 61.0 in | Wt 186.8 lb

## 2016-08-20 DIAGNOSIS — I10 Essential (primary) hypertension: Secondary | ICD-10-CM

## 2016-08-20 MED ORDER — CLONIDINE HCL 0.1 MG PO TABS: 0.1000 mg | ORAL_TABLET | Freq: Three times a day (TID) | ORAL | 0 refills | Status: DC

## 2016-08-20 NOTE — Progress Notes (Signed)
Subjective:    Patient ID: Amanda Hancock, female    DOB: 05-09-1945, 70 y.o.   MRN: [id number]  HPI  Amanda Hancock is a 71 year old female, patient of Dr. Elmer Sow, who presents today for follow up of hypertension. Currently managed on Clonidine 0.1 mg BID and amlodipine-benazepril 5/20 mg BID. Clonidine 0.1 mg was added during an Emergency Department visit for hypertensive urgency in late April 2018. She is working with psychiatry who had just started her on a few new medications in mid April 2018.  Her BP in the office today is 156/78. She's checking her BP at home and is getting 150's-170's/70's-80's. She does experience intermittent dizziness with positional changes, this is a chronic issue. She denies headaches, chest pain, lower extremity edema.   Review of Systems  Eyes: Negative for visual disturbance.  Respiratory: Negative for shortness of breath.   Cardiovascular: Negative for chest pain.  Neurological: Negative for headaches.       Intermittent dizziness with positional changes       Past Medical History:  Diagnosis Date  . Abnormal Pap smear    ASCUS  . Anxiety   . Chronic kidney disease    KIDNEYSTONES  . Depression   . Elevated blood pressure (not hypertension)   . Hypertension   . Vaginal Pap smear with ASC-US and positive HPV 12/20/2010  . Vaginal Pap smear, abnormal      Social History   Social History  . Marital status: Married    Spouse name: N/A  . Number of children: N/A  . Years of education: N/A   Occupational History  . Not on file.   Social History Main Topics  . Smoking status: Never Smoker  . Smokeless tobacco: Never Used  . Alcohol use No  . Drug use: No  . Sexual activity: Not Currently   Other Topics Concern  . Not on file   Social History Narrative   Does not have a living will.   Would desire CPR and life support if not futile.    Past Surgical History:  Procedure Laterality Date  . ABDOMINAL HYSTERECTOMY    . TUBAL  LIGATION      Family History  Problem Relation Age of Onset  . Parkinsonism Mother   . Dementia Mother   . Heart attack Paternal Grandfather   . Diabetes Maternal Grandmother   . Dementia Cousin   . Breast cancer Maternal Aunt   . Breast cancer Paternal Aunt     Allergies  Allergen Reactions  . Codeine Nausea And Vomiting and Other (See Comments)    headache  . Hctz [Hydrochlorothiazide]     Rash     Current Outpatient Prescriptions on File Prior to Visit  Medication Sig Dispense Refill  . amLODipine-benazepril (LOTREL) 5-20 MG capsule Take 1 capsule by mouth 2 (two) times daily. 180 capsule 0  . aspirin 81 MG tablet Take 81 mg by mouth daily.    . Cholecalciferol ([employer]S VITAMIN D) 2000 UNITS CAPS Take 1 capsule (2,000 Units total) by mouth daily. 30 each   . haloperidol (HALDOL) 1 MG tablet 1 mg po qhs 30 tablet 1  . QUEtiapine (SEROQUEL) 25 MG tablet Take 25 mg by mouth daily.     . sertraline (ZOLOFT) 100 MG tablet TAKE 1 TABLET BY MOUTH ONCE DAILY 90 tablet 0  . traZODone (DESYREL) 50 MG tablet TAKE ONE TABLET BY MOUTH AT BEDTIME AS NEEDED FOR SLEEP 90 tablet 1   No  current facility-administered medications on file prior to visit.     BP (!) 156/78   Pulse (!) 50   Temp 98 F (36.7 C) (Oral)   Ht 5\' 1"  (1.549 m)   Wt 186 lb 12.8 oz (84.7 kg)   SpO2 97%   BMI 35.30 kg/m    Objective:   Physical Exam  Constitutional: She appears well-nourished.  Neck: Neck supple.  Cardiovascular: Normal rate and regular rhythm.   Pulmonary/Chest: Effort normal and breath sounds normal.  Skin: Skin is warm and dry.  Psychiatric: She has a normal mood and affect.          Assessment & Plan:

## 2016-08-20 NOTE — Patient Instructions (Signed)
We've increased the frequency of your Clonidine 0.1 mg to three times daily. I sent a new prescription to your pharmacy.  Continue to monitor your blood pressure and follow up with Dr. Dayton MartesAron or myself in 2 weeks.  It was a pleasure to see you today!

## 2016-08-20 NOTE — Assessment & Plan Note (Signed)
Above goal in the office, also on home readings. This mornings home reading matches our office reading today. Already at max dose of Lotrel, HR cannot handle Beta Blocker, allergy to HCTZ so this leaves us with limited options. Will increase Clonidine to 0.1 mg TID. Continue Lotrel. She will monitor her BP at home and follow up in 2 weeks for re-check.

## 2016-08-21 DIAGNOSIS — R4189 Other symptoms and signs involving cognitive functions and awareness: Secondary | ICD-10-CM | POA: Diagnosis not present

## 2016-08-21 DIAGNOSIS — R443 Hallucinations, unspecified: Secondary | ICD-10-CM | POA: Diagnosis not present

## 2016-08-21 DIAGNOSIS — R0683 Snoring: Secondary | ICD-10-CM | POA: Diagnosis not present

## 2016-08-23 ENCOUNTER — Other Ambulatory Visit: Payer: Self-pay | Admitting: Neurology

## 2016-08-23 DIAGNOSIS — R4189 Other symptoms and signs involving cognitive functions and awareness: Secondary | ICD-10-CM

## 2016-08-23 DIAGNOSIS — R443 Hallucinations, unspecified: Secondary | ICD-10-CM

## 2016-08-28 ENCOUNTER — Ambulatory Visit
Admission: RE | Admit: 2016-08-28 | Discharge: 2016-08-28 | Disposition: A | Discharge status: Home / Self Care | Payer: PPO | Source: Ambulatory Visit | Attending: Neurology | Admitting: Neurology

## 2016-08-28 DIAGNOSIS — R4189 Other symptoms and signs involving cognitive functions and awareness: Secondary | ICD-10-CM | POA: Diagnosis not present

## 2016-08-28 DIAGNOSIS — I6782 Cerebral ischemia: Secondary | ICD-10-CM | POA: Diagnosis not present

## 2016-08-28 DIAGNOSIS — R443 Hallucinations, unspecified: Secondary | ICD-10-CM

## 2016-08-28 DIAGNOSIS — G9389 Other specified disorders of brain: Secondary | ICD-10-CM | POA: Diagnosis not present

## 2016-08-28 DIAGNOSIS — G319 Degenerative disease of nervous system, unspecified: Secondary | ICD-10-CM | POA: Insufficient documentation

## 2016-08-28 DIAGNOSIS — R413 Other amnesia: Secondary | ICD-10-CM | POA: Diagnosis not present

## 2016-09-03 ENCOUNTER — Encounter: Payer: Self-pay | Admitting: Family Medicine

## 2016-09-03 ENCOUNTER — Ambulatory Visit (INDEPENDENT_AMBULATORY_CARE_PROVIDER_SITE_OTHER): Payer: PPO | Admitting: Family Medicine

## 2016-09-03 VITALS — BP 130/86 | Ht 61.0 in | Wt 187.0 lb

## 2016-09-03 DIAGNOSIS — I10 Essential (primary) hypertension: Secondary | ICD-10-CM | POA: Diagnosis not present

## 2016-09-03 MED ORDER — CLONIDINE HCL 0.1 MG PO TABS: 0.1000 mg | ORAL_TABLET | Freq: Three times a day (TID) | ORAL | 0 refills | Status: DC

## 2016-09-03 NOTE — Progress Notes (Signed)
Subjective:   Patient ID: Amanda Hancock, female    DOB: Sep 05, 1945, 71 y.o.   MRN: [id number]  Amanda Hancock is a pleasant 71 y.o. year old female who presents to clinic today with Follow-up  on 09/03/2016  HPI:  Saw Amanda Hancock on 08/20/16 for worsening blood pressure control. Note reviewed.  She had been managed on Clonidine 0.1 mg BID and amlodipine-benazepril 5/20 mg BID. Clonidine 0.1 mg was added during an Emergency Department visit for hypertensive urgency in late April 2018. She also is being seen by psychiatry who had just started her on a few new medications in mid April 2018.  Her BP in the office today on 08/20/16 was 156/78. She was checking her BP at home and was getting readings in the 150's-170's/70's-80's along with some  intermittent dizziness with positional changes.   She denied headaches, chest pain, lower extremity edema.  Given her limited options of antihypertensives (allergic to HCTZ, max dose of Lotrel, BP could not tolerate higher dose of beta blocker), Amanda Hancock increased her clonidine to 0.1 mg three times daily and advised her to follow up here today.   Current Outpatient Prescriptions on File Prior to Visit  Medication Sig Dispense Refill  . amLODipine-benazepril (LOTREL) 5-20 MG capsule Take 1 capsule by mouth 2 (two) times daily. 180 capsule 0  . aspirin 81 MG tablet Take 81 mg by mouth daily.    . Cholecalciferol (CVS VITAMIN D) 2000 UNITS CAPS Take 1 capsule (2,000 Units total) by mouth daily. 30 each   . cloNIDine (CATAPRES) 0.1 MG tablet Take 1 tablet (0.1 mg total) by mouth 3 (three) times daily. 90 tablet 0  . haloperidol (HALDOL) 1 MG tablet 1 mg po qhs 30 tablet 1  . QUEtiapine (SEROQUEL) 25 MG tablet Take 25 mg by mouth daily.     . sertraline (ZOLOFT) 100 MG tablet TAKE 1 TABLET BY MOUTH ONCE DAILY 90 tablet 0  . traZODone (DESYREL) 50 MG tablet TAKE ONE TABLET BY MOUTH AT BEDTIME AS NEEDED FOR SLEEP 90 tablet 1   No current facility-administered  medications on file prior to visit.     Allergies  Allergen Reactions  . Codeine Nausea And Vomiting and Other (See Comments)    headache  . Hctz [Hydrochlorothiazide]     Rash     Past Medical History:  Diagnosis Date  . Abnormal Pap smear    ASCUS  . Anxiety   . Chronic kidney disease    KIDNEYSTONES  . Depression   . Elevated blood pressure (not hypertension)   . Hypertension   . Vaginal Pap smear with ASC-US and positive HPV 12/20/2010  . Vaginal Pap smear, abnormal     Past Surgical History:  Procedure Laterality Date  . ABDOMINAL HYSTERECTOMY    . TUBAL LIGATION      Family History  Problem Relation Age of Onset  . Parkinsonism Mother   . Dementia Mother   . Heart attack Paternal Grandfather   . Diabetes Maternal Grandmother   . Dementia Cousin   . Breast cancer Maternal Aunt   . Breast cancer Paternal Aunt     Social History   Social History  . Marital status: Married    Spouse name: N/A  . Number of children: N/A  . Years of education: N/A   Occupational History  . Not on file.   Social History Main Topics  . Smoking status: Never Smoker  . Smokeless tobacco: Never Used  .  Alcohol use No  . Drug use: No  . Sexual activity: Not Currently   Other Topics Concern  . Not on file   Social History Narrative   Does not have a living will.   Would desire CPR and life support if not futile.   The PMH, PSH, Social History, Family History, Medications, and allergies [facility], and have been updated if relevant.  Review of Systems  Constitutional: Negative.   Respiratory: Negative.   Cardiovascular: Negative.   Gastrointestinal: Negative.   Neurological: Negative.   Hematological: Negative.   Psychiatric/Behavioral: Negative.   All other systems reviewed and are negative.      Objective:    BP 130/86   Ht 5\' 1"  (1.549 m)   Wt 187 lb (84.8 kg)   BMI 35.33 kg/m    Physical Exam   General:   Well-developed,well-nourished,in no acute distress; alert,appropriate and cooperative throughout examination Head:  normocephalic and atraumatic.   Eyes:  vision grossly intact, PERRL Ears:  R ear normal and L ear normal externally, TMs clear bilaterally Nose:  no external deformity.   Mouth:  good dentition.   Neck:  No deformities, masses, or tenderness noted. Lungs:  Normal respiratory effort, chest expands symmetrically. Lungs are clear to auscultation, no crackles or wheezes. Heart:  Normal rate and regular rhythm. S1 and S2 normal without gallop, murmur, click, rub or other extra sounds. Abdomen:  Bowel sounds positive,abdomen soft and non-tender without masses, organomegaly or hernias noted. Msk:  No deformity or scoliosis noted of thoracic or lumbar spine.   Extremities:  No clubbing, cyanosis, edema, or deformity noted with normal full range of motion of all joints.   Neurologic:  alert & oriented X3 and gait normal.   Skin:  Intact without suspicious lesions or rashes Psych:  Cognition and judgment appear intact. Alert and cooperative with normal attention span and concentration. No apparent delusions, illusions, hallucinations       Assessment & Plan:   Essential hypertension No Follow-up on file.

## 2016-09-03 NOTE — Assessment & Plan Note (Signed)
Improved control with higher dose of clonidine. Continue current rxs. No changes made today. The patient indicates understanding of these issues and agrees with the plan.

## 2016-09-25 ENCOUNTER — Ambulatory Visit: Payer: PPO | Attending: Neurology

## 2016-09-25 DIAGNOSIS — R0683 Snoring: Secondary | ICD-10-CM | POA: Diagnosis not present

## 2016-09-25 DIAGNOSIS — G4733 Obstructive sleep apnea (adult) (pediatric): Secondary | ICD-10-CM | POA: Insufficient documentation

## 2016-09-25 DIAGNOSIS — R413 Other amnesia: Secondary | ICD-10-CM | POA: Insufficient documentation

## 2016-09-25 DIAGNOSIS — G4730 Sleep apnea, unspecified: Secondary | ICD-10-CM | POA: Diagnosis not present

## 2016-10-10 NOTE — Progress Notes (Signed)
PCP notes: Pt saw Dr Maryruth BunKapur 10/14/2016, she increased her Zoloft and Seroquel, MAR updated per list from pt.  Health maintenance: Tdap - due, pt instructed to call insurance company regarding coverage.   Abnormal screenings:  PHQ9 score - 7. Clock drawing.   Blood pressure.   Patient concerns:  Dr Maryruth BunKapur told pt that she may need a walker. Pt wondering if she qualifies for a walker through insurance.   Nurse concerns: Pt was very tearful during our visit. She states that her visit yesterday with Dr Maryruth BunKapur she was told that she has dementia. She states that she was not expecting this diagnosis. Pt does stated that she has a good support system which includes her daughter and daughter in law.   Next PCP appt: 10/16/2016.

## 2016-10-10 NOTE — Progress Notes (Signed)
Subjective:   Amanda Hancock is a 71 y.o. female who presents for Medicare Annual (Subsequent) preventive examination.  Review of Systems:  No ROS.  Medicare Wellness Visit. Additional risk factors are reflected in the social history.  Cardiac Risk Factors include: advanced age (>1855men, 44>65 women);hypertension;obesity (BMI >30kg/m2);sedentary lifestyle     Objective:     Vitals: BP (!) 162/70 (BP Location: Left Arm, Patient Position: Sitting, Cuff Size: Large)   Pulse (!) 54   Resp 16   Ht 5' (1.524 m)   Wt 185 lb 12.8 oz (84.3 kg)   SpO2 96%   BMI 36.29 kg/m   Body mass index is 36.29 kg/m.   Tobacco History  Smoking Status  . Never Smoker  Smokeless Tobacco  . Never Used     Counseling given: Not Answered   Past Medical History:  Diagnosis Date  . Abnormal Pap smear    ASCUS  . Anxiety   . Chronic kidney disease    KIDNEYSTONES  . Depression   . Elevated blood pressure (not hypertension)   . Hypertension   . Vaginal Pap smear with ASC-US and positive HPV 12/20/2010  . Vaginal Pap smear, abnormal    Past Surgical History:  Procedure Laterality Date  . ABDOMINAL HYSTERECTOMY    . TUBAL LIGATION     Family History  Problem Relation Age of Onset  . Parkinsonism Mother   . Dementia Mother   . Heart attack Paternal Grandfather   . Diabetes Maternal Grandmother   . Dementia Cousin   . Breast cancer Maternal Aunt   . Breast cancer Paternal Aunt    History  Sexual Activity  . Sexual activity: Not Currently    Outpatient Encounter Prescriptions as of 10/15/2016  Medication Sig  . amLODipine-benazepril (LOTREL) 5-20 MG capsule Take 1 capsule by mouth 2 (two) times daily.  Marland Kitchen. aspirin 81 MG tablet Take 81 mg by mouth daily.  . Cholecalciferol ([employer]S VITAMIN D) 2000 UNITS CAPS Take 1 capsule (2,000 Units total) by mouth daily.  . cloNIDine (CATAPRES) 0.1 MG tablet Take 1 tablet (0.1 mg total) by mouth 3 (three) times daily.  . haloperidol (HALDOL) 1 MG  tablet 1 mg po qhs  . QUEtiapine (SEROQUEL) 50 MG tablet Take 75 mg by mouth daily.   . sertraline (ZOLOFT) 100 MG tablet TAKE 1 TABLET BY MOUTH ONCE DAILY (Patient taking differently: TAKE 2 TABLET BY MOUTH ONCE DAILY)  . traZODone (DESYREL) 50 MG tablet TAKE ONE TABLET BY MOUTH AT BEDTIME AS NEEDED FOR SLEEP   No facility-administered encounter medications on file as of 10/15/2016.     Activities of Daily Living In your present state of health, do you have any difficulty performing the following activities: 10/15/2016  Hearing? N  Vision? N  Difficulty concentrating or making decisions? N  Walking or climbing stairs? N  Dressing or bathing? N  Doing errands, shopping? N  Preparing Food and eating ? N  Using the Toilet? N  In the past six months, have you accidently leaked urine? Y  Do you have problems with loss of bowel control? N  Managing your Medications? N  Managing your Finances? N  Housekeeping or managing your Housekeeping? N  Some recent data might be hidden    Patient Care Team: Dianne DunAron, Talia M, MD as PCP - General (Family Medicine) Judyann MunsonSnider, Cynthia, MD as Consulting Physician (Infectious Diseases) Adam PhenixArnold, James G, MD as Consulting Physician (Obstetrics and Gynecology) Darliss RidgelKapur, Aarti K, MD as Referring  Physician (Psychiatry)    Assessment:    Physical assessment deferred to PCP.  Exercise Activities and Dietary recommendations Current Exercise Habits: The patient does not participate in regular exercise at present, Exercise limited by: None identified  Goals    None     Fall Risk Fall Risk  10/15/2016 01/18/2015 12/30/2013 03/16/2013 02/18/2012  Falls in the past year? Yes Yes Yes Yes No  Number falls in past yr: 2 or more 2 or more 1 1 -  Injury with Fall? No No No No -  Risk Factor Category  High Fall Risk - - - -  Risk for fall due to : - - History of fall(s) Other (Comment) -  Risk for fall due to: Comment - - - fell due missing a step -  Follow up Education  provided;Falls prevention discussed - - - -   Depression Screen PHQ 2/9 Scores 10/15/2016 08/31/2015 01/18/2015 12/30/2013  PHQ - 2 Score 1 2 0 0  PHQ- 9 Score 7 9 - -     Cognitive Function PLEASE NOTE: A Mini-Cog screen was completed. Maximum score is 20. A value of 0 denotes this part of Folstein MMSE was not completed or the patient failed this part of the Mini-Cog screening.   Mini-Cog Screening Orientation to Time - Max 5 pts Orientation to Place - Max 5 pts Registration - Max 3 pts Recall - Max 3 pts Language Repeat - Max 1 pts Language Follow 3 Step Command - Max 3 pts      Mini-Cog - 10/15/16 1448    Normal clock drawing test? no   How many words correct? 3      MMSE - Mini Mental State Exam 10/15/2016  Orientation to time 3  Orientation to Place 4  Registration 3  Attention/ Calculation 0  Recall 3  Language- name 2 objects 0  Language- repeat 1  Language- follow 3 step command 3  Language- read & follow direction 0  Write a sentence 0  Copy design 0  Total score 17        Immunization History  Administered Date(s) Administered  . Influenza Split 12/13/2010, 11/29/2012  . Influenza Whole 11/28/2011  . Influenza, High Dose Seasonal PF 12/13/2013  . Influenza,inj,Quad PF,36+ Mos 01/18/2015  . Influenza-Unspecified 01/28/2016  . Pneumococcal Conjugate-13 01/18/2015, 01/28/2016  . Pneumococcal Polysaccharide-23 12/20/2010  . Zoster 12/20/2010   Screening Tests Health Maintenance  Topic Date Due  . TETANUS/TDAP  10/07/1964  . INFLUENZA VACCINE  10/16/2016  . MAMMOGRAM  03/27/2018  . COLONOSCOPY  08/25/2023  . DEXA SCAN  Completed  . Hepatitis C Screening  Completed  . PNA vac Low Risk Adult  Completed      Plan:   Follow up with PCP as directed.  I have personally reviewed and noted the following in the patient's chart:   . Medical and social history . Use of alcohol, tobacco or illicit drugs  . Current medications and  supplements . Functional ability and status . Nutritional status . Physical activity . Advanced directives . List of other physicians . Vitals . Screenings to include cognitive, depression, and falls . Referrals and appointments  In addition, I have reviewed and discussed with patient certain preventive protocols, quality metrics, and best practice recommendations. A written personalized care plan for preventive services as well as general preventive health recommendations were provided to patient.     Richelle Itoassandra Albright, RN  10/15/2016

## 2016-10-11 ENCOUNTER — Other Ambulatory Visit: Payer: Self-pay | Admitting: Family Medicine

## 2016-10-11 DIAGNOSIS — Z01419 Encounter for gynecological examination (general) (routine) without abnormal findings: Secondary | ICD-10-CM

## 2016-10-11 DIAGNOSIS — Z0000 Encounter for general adult medical examination without abnormal findings: Secondary | ICD-10-CM | POA: Insufficient documentation

## 2016-10-14 DIAGNOSIS — F333 Major depressive disorder, recurrent, severe with psychotic symptoms: Secondary | ICD-10-CM | POA: Diagnosis not present

## 2016-10-14 DIAGNOSIS — F5105 Insomnia due to other mental disorder: Secondary | ICD-10-CM | POA: Diagnosis not present

## 2016-10-15 ENCOUNTER — Ambulatory Visit (INDEPENDENT_AMBULATORY_CARE_PROVIDER_SITE_OTHER): Payer: PPO

## 2016-10-15 ENCOUNTER — Other Ambulatory Visit (INDEPENDENT_AMBULATORY_CARE_PROVIDER_SITE_OTHER): Payer: PPO

## 2016-10-15 VITALS — BP 162/70 | HR 54 | Resp 16 | Ht 60.0 in | Wt 185.8 lb

## 2016-10-15 DIAGNOSIS — Z0000 Encounter for general adult medical examination without abnormal findings: Secondary | ICD-10-CM

## 2016-10-15 DIAGNOSIS — Z01419 Encounter for gynecological examination (general) (routine) without abnormal findings: Secondary | ICD-10-CM | POA: Diagnosis not present

## 2016-10-15 LAB — TSH: TSH: 0.65 u[IU]/mL (ref 0.35–4.50)

## 2016-10-15 LAB — COMPREHENSIVE METABOLIC PANEL
ALK PHOS: 89 U/L (ref 39–117)
ALT: 11 U/L (ref 0–35)
AST: 13 U/L (ref 0–37)
Albumin: 3.9 g/dL (ref 3.5–5.2)
BILIRUBIN TOTAL: 0.6 mg/dL (ref 0.2–1.2)
BUN: 16 mg/dL (ref 6–23)
CO2: 29 meq/L (ref 19–32)
Calcium: 10.3 mg/dL (ref 8.4–10.5)
Chloride: 107 mEq/L (ref 96–112)
Creatinine, Ser: 0.82 mg/dL (ref 0.40–1.20)
GFR: 73.04 mL/min (ref >60.00)
GLUCOSE: 99 mg/dL (ref 70–99)
POTASSIUM: 4.1 meq/L (ref 3.5–5.1)
SODIUM: 140 meq/L (ref 135–145)
TOTAL PROTEIN: 6.7 g/dL (ref 6.0–8.3)

## 2016-10-15 LAB — LIPID PANEL
Cholesterol: 193 mg/dL (ref 0–200)
HDL: 40 mg/dL (ref >39.00)
LDL Cholesterol: 136 mg/dL — ABNORMAL HIGH (ref 0–99)
NONHDL: 152.81
Total CHOL/HDL Ratio: 5
Triglycerides: 85 mg/dL (ref 0.0–149.0)
VLDL: 17 mg/dL (ref 0.0–40.0)

## 2016-10-15 LAB — CBC WITH DIFFERENTIAL/PLATELET
Basophils Absolute: 0 10*3/uL (ref 0.0–0.1)
Basophils Relative: 0.3 % (ref 0.0–3.0)
EOS PCT: 2.6 % (ref 0.0–5.0)
Eosinophils Absolute: 0.2 10*3/uL (ref 0.0–0.7)
HCT: 40.3 % (ref 36.0–46.0)
Hemoglobin: 13.4 g/dL (ref 12.0–15.0)
LYMPHS ABS: 1.3 10*3/uL (ref 0.7–4.0)
LYMPHS PCT: 20.2 % (ref 12.0–46.0)
MCHC: 33.2 g/dL (ref 30.0–36.0)
MCV: 87.9 fl (ref 78.0–100.0)
MONOS PCT: 6.8 % (ref 3.0–12.0)
Monocytes Absolute: 0.4 10*3/uL (ref 0.1–1.0)
NEUTROS PCT: 70.1 % (ref 43.0–77.0)
Neutro Abs: 4.7 10*3/uL (ref 1.4–7.7)
Platelets: 162 10*3/uL (ref 150.0–400.0)
RBC: 4.58 Mil/uL (ref 3.87–5.11)
RDW: 14.5 % (ref 11.5–15.5)
WBC: 6.7 10*3/uL (ref 4.0–10.5)

## 2016-10-15 NOTE — Patient Instructions (Signed)
[name] , Thank you for taking time to come for your Medicare Wellness Visit. I appreciate your ongoing commitment to your health goals. Please review the following plan we discussed and let me know if I can assist you in the future.   These are the goals we discussed: Goals    None      This is a list of the screening recommended for you and due dates:  Health Maintenance  Topic Date Due  . Tetanus Vaccine  10/07/1964  . Flu Shot  10/16/2016  . Mammogram  03/27/2018  . Colon Cancer Screening  08/25/2023  . DEXA scan (bone density measurement)  Completed  .  Hepatitis C: One time screening is recommended by Center for Disease Control  (CDC) for  adults born from [birth year] through 1965.   Completed  . Pneumonia vaccines  Completed   Preventive Care for Adults  A healthy lifestyle and preventive care can promote health and wellness. Preventive health guidelines for adults include the following key practices.  . A routine yearly physical is a good way to check with your health care provider about your health and preventive screening. It is a chance to share any concerns and updates on your health and to receive a thorough exam.  . Visit your dentist for a routine exam and preventive care every 6 months. Brush your teeth twice a day and floss once a day. Good oral hygiene prevents tooth decay and gum disease.  . The frequency of eye exams is based on your age, health, family medical history, use  of contact lenses, and other factors. Follow your health care provider's ecommendations for frequency of eye exams.  . Eat a healthy diet. Foods like vegetables, fruits, whole grains, low-fat dairy products, and lean protein foods contain the nutrients you need without too many calories. Decrease your intake of foods high in solid fats, added sugars, and salt. Eat the right amount of calories for you. Get information about a proper diet from your health care provider, if necessary.  . Regular  physical exercise is one of the most important things you can do for your health. Most adults should get at least 150 minutes of moderate-intensity exercise (any activity that increases your heart rate and causes you to sweat) each week. In addition, most adults need muscle-strengthening exercises on 2 or more days a week.  Silver Sneakers may be a benefit available to you. To determine eligibility, you may visit the website: www.silversneakers.com or contact program at [phone] Mon-Fri between 8AM-8PM.   . Maintain a healthy weight. The body mass index (BMI) is a screening tool to identify possible weight problems. It provides an estimate of body fat based on height and weight. Your health care provider can find your BMI and can help you achieve or maintain a healthy weight.   For adults 20 years and older: ? A BMI below 18.5 is considered underweight. ? A BMI of 18.5 to 24.9 is normal. ? A BMI of 25 to 29.9 is considered overweight. ? A BMI of 30 and above is considered obese.   . Maintain normal blood lipids and cholesterol levels by exercising and minimizing your intake of saturated fat. Eat a balanced diet with plenty of fruit and vegetables. Blood tests for lipids and cholesterol should begin at age 71 and be repeated every 5 years. If your lipid or cholesterol levels are high, you are over 50, or you are at high risk for heart disease, you may  need your cholesterol levels checked more frequently. Ongoing high lipid and cholesterol levels should be treated with medicines if diet and exercise are not working.  . If you smoke, find out from your health care provider how to quit. If you do not use tobacco, please do not start.  . If you choose to drink alcohol, please do not consume more than 2 drinks per day. One drink is considered to be 12 ounces (355 mL) of beer, 5 ounces (148 mL) of wine, or 1.5 ounces (44 mL) of liquor.  . If you are 13-43 years old, ask your health care provider if  you should take aspirin to prevent strokes.  . Use sunscreen. Apply sunscreen liberally and repeatedly throughout the day. You should seek shade when your shadow is shorter than you. Protect yourself by wearing long sleeves, pants, a wide-brimmed hat, and sunglasses year round, whenever you are outdoors.  . Once a month, do a whole body skin exam, using a mirror to look at the skin on your back. Tell your health care provider of new moles, moles that have irregular borders, moles that are larger than a pencil eraser, or moles that have changed in shape or color.

## 2016-10-15 NOTE — Progress Notes (Signed)
I reviewed health advisor's note, was available for consultation, and agree with documentation and plan.  

## 2016-10-16 ENCOUNTER — Ambulatory Visit (INDEPENDENT_AMBULATORY_CARE_PROVIDER_SITE_OTHER): Payer: PPO | Admitting: Family Medicine

## 2016-10-16 ENCOUNTER — Encounter: Payer: Self-pay | Admitting: Family Medicine

## 2016-10-16 VITALS — BP 140/80 | HR 56 | Ht 60.0 in | Wt 185.0 lb

## 2016-10-16 DIAGNOSIS — F323 Major depressive disorder, single episode, severe with psychotic features: Secondary | ICD-10-CM

## 2016-10-16 DIAGNOSIS — Z01419 Encounter for gynecological examination (general) (routine) without abnormal findings: Secondary | ICD-10-CM

## 2016-10-16 DIAGNOSIS — Z0000 Encounter for general adult medical examination without abnormal findings: Secondary | ICD-10-CM | POA: Diagnosis not present

## 2016-10-16 DIAGNOSIS — F4323 Adjustment disorder with mixed anxiety and depressed mood: Secondary | ICD-10-CM

## 2016-10-16 DIAGNOSIS — I10 Essential (primary) hypertension: Secondary | ICD-10-CM

## 2016-10-16 NOTE — Assessment & Plan Note (Signed)
Reviewed preventive care protocols, scheduled due services, and updated immunizations Discussed nutrition, exercise, diet, and healthy lifestyle.  

## 2016-10-16 NOTE — Assessment & Plan Note (Signed)
Reasonable control. 

## 2016-10-16 NOTE — Progress Notes (Signed)
Subjective:   Patient ID: Amanda Hancock, female    DOB: 1946-02-28, 71 y.o.   MRN: [id number]  Amanda AvenaDiane M Able is a pleasant 71 y.o. year old female who presents to clinic today with Annual Exam (took BP meds at 7:00am )  and follow up of chronic medical conditions on 10/16/2016  HPI:  Saw Randa EvensLesia Pinson, RN for medicare wellness visit yesterday. Note reviewed.  Remote h/o hysterectomy Colonoscopy 08/24/13 Pneumococcal vaccinations UTD Mammogram 03/27/16  Depression- sawDr Maryruth BunKapur 10/14/2016, she increased her Zoloft and Seroquel.  HTN- Has been difficult to control but has been under better control lately.   A little high today.  Taking Lotrel, Catapress 0.1 mg three times daily.  BP Readings from Last 3 Encounters:  10/16/16 140/80  10/15/16 (!) 162/70  09/03/16 130/86   Lab Results  Component Value Date   CHOL 193 10/15/2016   HDL 40.00 10/15/2016   LDLCALC 136 (H) 10/15/2016   TRIG 85.0 10/15/2016   CHOLHDL 5 10/15/2016   Lab Results  Component Value Date   NA 140 10/15/2016   K 4.1 10/15/2016   CL 107 10/15/2016   CO2 29 10/15/2016   Lab Results  Component Value Date   CREATININE 0.82 10/15/2016   Lab Results  Component Value Date   WBC 6.7 10/15/2016   HGB 13.4 10/15/2016   HCT 40.3 10/15/2016   MCV 87.9 10/15/2016   PLT 162.0 10/15/2016   Lab Results  Component Value Date   TSH 0.65 10/15/2016     Current Outpatient Prescriptions on File Prior to Visit  Medication Sig Dispense Refill  . amLODipine-benazepril (LOTREL) 5-20 MG capsule Take 1 capsule by mouth 2 (two) times daily. 180 capsule 0  . aspirin 81 MG tablet Take 81 mg by mouth daily.    . Cholecalciferol ([employer]S VITAMIN D) 2000 UNITS CAPS Take 1 capsule (2,000 Units total) by mouth daily. 30 each   . cloNIDine (CATAPRES) 0.1 MG tablet Take 1 tablet (0.1 mg total) by mouth 3 (three) times daily. 90 tablet 0  . haloperidol (HALDOL) 1 MG tablet 1 mg po qhs 30 tablet 1  . QUEtiapine (SEROQUEL) 50 MG  tablet Take 75 mg by mouth daily.     . sertraline (ZOLOFT) 100 MG tablet TAKE 1 TABLET BY MOUTH ONCE DAILY (Patient taking differently: TAKE 2 TABLET BY MOUTH ONCE DAILY) 90 tablet 0  . traZODone (DESYREL) 50 MG tablet TAKE ONE TABLET BY MOUTH AT BEDTIME AS NEEDED FOR SLEEP 90 tablet 1   No current facility-administered medications on file prior to visit.     Allergies  Allergen Reactions  . Codeine Nausea And Vomiting and Other (See Comments)    headache  . Hctz [Hydrochlorothiazide]     Rash     Past Medical History:  Diagnosis Date  . Abnormal Pap smear    ASCUS  . Anxiety   . Chronic kidney disease    KIDNEYSTONES  . Depression   . Elevated blood pressure (not hypertension)   . Hypertension   . Vaginal Pap smear with ASC-US and positive HPV 12/20/2010  . Vaginal Pap smear, abnormal     Past Surgical History:  Procedure Laterality Date  . ABDOMINAL HYSTERECTOMY    . TUBAL LIGATION      Family History  Problem Relation Age of Onset  . Parkinsonism Mother   . Dementia Mother   . Heart attack Paternal Grandfather   . Diabetes Maternal Grandmother   . Dementia Cousin   .  Breast cancer Maternal Aunt   . Breast cancer Paternal Aunt     Social History   Social History  . Marital status: Married    Spouse name: N/A  . Number of children: N/A  . Years of education: N/A   Occupational History  . Not on file.   Social History Main Topics  . Smoking status: Never Smoker  . Smokeless tobacco: Never Used  . Alcohol use No  . Drug use: No  . Sexual activity: Not Currently   Other Topics Concern  . Not on file   Social History Narrative   Does not have a living will.   Would desire CPR and life support if not futile.   The PMH, PSH, Social History, Family History, Medications, and allergies [facility], and have been updated if relevant.   Review of Systems  Constitutional: Negative.   HENT: Negative.   Eyes: Negative.   Respiratory:  Negative.   Cardiovascular: Negative.   Gastrointestinal: Negative.   Endocrine: Negative.   Genitourinary: Negative.   Musculoskeletal: Negative.   Skin: Negative.   Allergic/Immunologic: Negative.   Neurological: Negative.   Hematological: Negative.   Psychiatric/Behavioral: Negative.   All other systems reviewed and are negative.      Objective:    BP 140/80   Pulse (!) 56   Ht 5' (1.524 m)   Wt 185 lb (83.9 kg)   SpO2 99%   BMI 36.13 kg/m    Physical Exam   General:  Well-developed,well-nourished,in no acute distress; alert,appropriate and cooperative throughout examination Head:  normocephalic and atraumatic.   Eyes:  vision grossly intact, PERRL Ears:  R ear normal and L ear normal externally, TMs clear bilaterally Nose:  no external deformity.   Mouth:  good dentition.   Neck:  No deformities, masses, or tenderness noted. Lungs:  Normal respiratory effort, chest expands symmetrically. Lungs are clear to auscultation, no crackles or wheezes. Heart:  Normal rate and regular rhythm. S1 and S2 normal without gallop, murmur, click, rub or other extra sounds. Abdomen:  Bowel sounds positive,abdomen soft and non-tender without masses, organomegaly or hernias noted. Msk:  No deformity or scoliosis noted of thoracic or lumbar spine.   Extremities:  No clubbing, cyanosis, edema, or deformity noted with normal full range of motion of all joints.   Neurologic:  alert & oriented X3 and gait normal.   Skin:  Intact without suspicious lesions or rashes Cervical Nodes:  No lymphadenopathy noted Axillary Nodes:  No palpable lymphadenopathy Psych:  Cognition and judgment appear intact. Alert and cooperative with normal attention span and concentration. No apparent delusions, illusions, hallucinations       Assessment & Plan:   Well woman exam  Essential hypertension  Adjustment disorder with mixed anxiety and depressed mood No Follow-up on file.

## 2016-10-16 NOTE — Assessment & Plan Note (Signed)
Followed by Dr. Maryruth BunKapur. No changes made to rxs today.

## 2016-10-23 ENCOUNTER — Other Ambulatory Visit: Payer: Self-pay | Admitting: Family Medicine

## 2016-10-23 DIAGNOSIS — I10 Essential (primary) hypertension: Secondary | ICD-10-CM

## 2016-11-11 DIAGNOSIS — F333 Major depressive disorder, recurrent, severe with psychotic symptoms: Secondary | ICD-10-CM | POA: Diagnosis not present

## 2016-11-11 DIAGNOSIS — F5105 Insomnia due to other mental disorder: Secondary | ICD-10-CM | POA: Diagnosis not present

## 2016-11-11 DIAGNOSIS — D519 Vitamin B12 deficiency anemia, unspecified: Secondary | ICD-10-CM | POA: Diagnosis not present

## 2016-11-20 DIAGNOSIS — F0150 Vascular dementia without behavioral disturbance: Secondary | ICD-10-CM | POA: Diagnosis not present

## 2016-11-20 DIAGNOSIS — F0280 Dementia in other diseases classified elsewhere without behavioral disturbance: Secondary | ICD-10-CM | POA: Diagnosis not present

## 2016-11-20 DIAGNOSIS — R443 Hallucinations, unspecified: Secondary | ICD-10-CM | POA: Diagnosis not present

## 2016-11-20 DIAGNOSIS — G309 Alzheimer's disease, unspecified: Secondary | ICD-10-CM | POA: Diagnosis not present

## 2016-12-11 DIAGNOSIS — F0280 Dementia in other diseases classified elsewhere without behavioral disturbance: Secondary | ICD-10-CM | POA: Diagnosis not present

## 2016-12-11 DIAGNOSIS — D519 Vitamin B12 deficiency anemia, unspecified: Secondary | ICD-10-CM | POA: Diagnosis not present

## 2016-12-11 DIAGNOSIS — F333 Major depressive disorder, recurrent, severe with psychotic symptoms: Secondary | ICD-10-CM | POA: Diagnosis not present

## 2016-12-11 DIAGNOSIS — F5105 Insomnia due to other mental disorder: Secondary | ICD-10-CM | POA: Diagnosis not present

## 2017-02-22 ENCOUNTER — Other Ambulatory Visit: Payer: Self-pay | Admitting: Family Medicine

## 2017-02-22 DIAGNOSIS — I10 Essential (primary) hypertension: Secondary | ICD-10-CM

## 2017-03-03 DIAGNOSIS — F411 Generalized anxiety disorder: Secondary | ICD-10-CM | POA: Diagnosis not present

## 2017-03-03 DIAGNOSIS — F3341 Major depressive disorder, recurrent, in partial remission: Secondary | ICD-10-CM | POA: Diagnosis not present

## 2017-03-03 DIAGNOSIS — E538 Deficiency of other specified B group vitamins: Secondary | ICD-10-CM | POA: Diagnosis not present

## 2017-03-03 DIAGNOSIS — G4733 Obstructive sleep apnea (adult) (pediatric): Secondary | ICD-10-CM | POA: Diagnosis not present

## 2017-03-03 DIAGNOSIS — R4189 Other symptoms and signs involving cognitive functions and awareness: Secondary | ICD-10-CM | POA: Diagnosis not present

## 2017-03-25 ENCOUNTER — Ambulatory Visit: Payer: PPO | Attending: Neurology

## 2017-03-25 DIAGNOSIS — G4733 Obstructive sleep apnea (adult) (pediatric): Secondary | ICD-10-CM | POA: Diagnosis not present

## 2017-06-02 DIAGNOSIS — R4189 Other symptoms and signs involving cognitive functions and awareness: Secondary | ICD-10-CM | POA: Diagnosis not present

## 2017-06-02 DIAGNOSIS — G4733 Obstructive sleep apnea (adult) (pediatric): Secondary | ICD-10-CM | POA: Diagnosis not present

## 2017-06-13 DIAGNOSIS — G4733 Obstructive sleep apnea (adult) (pediatric): Secondary | ICD-10-CM | POA: Diagnosis not present

## 2017-06-13 DIAGNOSIS — G4710 Hypersomnia, unspecified: Secondary | ICD-10-CM | POA: Diagnosis not present

## 2017-07-02 ENCOUNTER — Ambulatory Visit: Payer: Self-pay | Admitting: Primary Care

## 2017-07-08 ENCOUNTER — Ambulatory Visit: Payer: Self-pay | Admitting: Primary Care

## 2017-07-14 DIAGNOSIS — G4733 Obstructive sleep apnea (adult) (pediatric): Secondary | ICD-10-CM | POA: Diagnosis not present

## 2017-07-14 DIAGNOSIS — G4710 Hypersomnia, unspecified: Secondary | ICD-10-CM | POA: Diagnosis not present

## 2017-08-13 DIAGNOSIS — G4710 Hypersomnia, unspecified: Secondary | ICD-10-CM | POA: Diagnosis not present

## 2017-08-13 DIAGNOSIS — G4733 Obstructive sleep apnea (adult) (pediatric): Secondary | ICD-10-CM | POA: Diagnosis not present

## 2017-08-29 ENCOUNTER — Other Ambulatory Visit: Payer: Self-pay | Admitting: Family Medicine

## 2017-08-29 DIAGNOSIS — I10 Essential (primary) hypertension: Secondary | ICD-10-CM

## 2017-10-24 ENCOUNTER — Telehealth: Payer: Self-pay

## 2017-10-24 DIAGNOSIS — I10 Essential (primary) hypertension: Secondary | ICD-10-CM

## 2017-10-24 DIAGNOSIS — E559 Vitamin D deficiency, unspecified: Secondary | ICD-10-CM

## 2017-10-24 NOTE — Telephone Encounter (Signed)
TA-Pt is sched for AWV with Lawanna KobusAngel on 8.14.19 and a F/U with you on 8.19.19/would you like the following labs for future order? TSH/Lipid/CMP/CBC/Vit-Courtne Lighty with Hypertension; Hyercalcemia; Vit-Anibal Quinby Def?Plz advise/thx dmf

## 2017-10-25 NOTE — Telephone Encounter (Signed)
Yes please. Thank you

## 2017-10-27 ENCOUNTER — Ambulatory Visit: Payer: Self-pay

## 2017-10-27 NOTE — Telephone Encounter (Signed)
Created future orders per TA/thx dmf 

## 2017-10-29 ENCOUNTER — Ambulatory Visit: Payer: PPO | Admitting: Behavioral Health

## 2017-10-29 ENCOUNTER — Other Ambulatory Visit: Payer: Self-pay

## 2017-11-03 ENCOUNTER — Ambulatory Visit: Payer: PPO | Admitting: Family Medicine

## 2017-11-28 DIAGNOSIS — R208 Other disturbances of skin sensation: Secondary | ICD-10-CM | POA: Diagnosis not present

## 2017-11-28 DIAGNOSIS — R51 Headache: Secondary | ICD-10-CM | POA: Diagnosis not present

## 2017-12-01 ENCOUNTER — Other Ambulatory Visit: Payer: Self-pay | Admitting: Neurology

## 2017-12-01 DIAGNOSIS — R51 Headache: Principal | ICD-10-CM

## 2017-12-01 DIAGNOSIS — R519 Headache, unspecified: Secondary | ICD-10-CM

## 2017-12-08 ENCOUNTER — Encounter: Payer: Self-pay | Admitting: Primary Care

## 2017-12-08 ENCOUNTER — Encounter (INDEPENDENT_AMBULATORY_CARE_PROVIDER_SITE_OTHER): Payer: Self-pay

## 2017-12-08 ENCOUNTER — Ambulatory Visit (INDEPENDENT_AMBULATORY_CARE_PROVIDER_SITE_OTHER): Payer: PPO | Admitting: Primary Care

## 2017-12-08 VITALS — BP 184/100 | HR 52 | Temp 98.0°F | Ht 60.0 in | Wt 193.2 lb

## 2017-12-08 DIAGNOSIS — E2839 Other primary ovarian failure: Secondary | ICD-10-CM

## 2017-12-08 DIAGNOSIS — Z23 Encounter for immunization: Secondary | ICD-10-CM

## 2017-12-08 DIAGNOSIS — I10 Essential (primary) hypertension: Secondary | ICD-10-CM | POA: Diagnosis not present

## 2017-12-08 DIAGNOSIS — E559 Vitamin D deficiency, unspecified: Secondary | ICD-10-CM

## 2017-12-08 DIAGNOSIS — F323 Major depressive disorder, single episode, severe with psychotic features: Secondary | ICD-10-CM | POA: Diagnosis not present

## 2017-12-08 LAB — COMPREHENSIVE METABOLIC PANEL
ALK PHOS: 81 U/L (ref 39–117)
ALT: 13 U/L (ref 0–35)
AST: 12 U/L (ref 0–37)
Albumin: 4 g/dL (ref 3.5–5.2)
BUN: 14 mg/dL (ref 6–23)
CO2: 30 meq/L (ref 19–32)
Calcium: 10.3 mg/dL (ref 8.4–10.5)
Chloride: 108 mEq/L (ref 96–112)
Creatinine, Ser: 0.9 mg/dL (ref 0.40–1.20)
GFR: 65.38 mL/min (ref >60.00)
GLUCOSE: 113 mg/dL — AB (ref 70–99)
POTASSIUM: 4.3 meq/L (ref 3.5–5.1)
Sodium: 142 mEq/L (ref 135–145)
TOTAL PROTEIN: 6.7 g/dL (ref 6.0–8.3)
Total Bilirubin: 0.6 mg/dL (ref 0.2–1.2)

## 2017-12-08 LAB — LIPID PANEL
CHOL/HDL RATIO: 4
Cholesterol: 128 mg/dL (ref 0–200)
HDL: 30.2 mg/dL — AB (ref >39.00)
LDL Cholesterol: 81 mg/dL (ref 0–99)
NONHDL: 97.37
Triglycerides: 84 mg/dL (ref 0.0–149.0)
VLDL: 16.8 mg/dL (ref 0.0–40.0)

## 2017-12-08 LAB — VITAMIN D 25 HYDROXY (VIT D DEFICIENCY, FRACTURES): VITD: 31.86 ng/mL (ref 30.00–100.00)

## 2017-12-08 MED ORDER — AMLODIPINE BESY-BENAZEPRIL HCL 5-20 MG PO CAPS: ORAL_CAPSULE | ORAL | 0 refills | Status: DC

## 2017-12-08 NOTE — Assessment & Plan Note (Signed)
Repeat vitamin D pending. Bone density scan due, pending.

## 2017-12-08 NOTE — Patient Instructions (Signed)
Start amlodipine-benazepril 5-20 mg tablets for blood pressure. Take 1 tablet by mouth once daily.  Stop clonidine 0.1 mg tablets for blood pressure.  Please schedule a physical with me in 2-3 weeks. We will also follow up on your blood pressure at that time.  Start monitoring your blood pressure daily, around the same time of day, for the next 2-3 weeks.  Ensure that you have rested for 30 minutes prior to checking your blood pressure. Record your readings and bring them to your next visit.  It was a pleasure to see you today!

## 2017-12-08 NOTE — Progress Notes (Signed)
Subjective:    Patient ID: Amanda Hancock, female    DOB: [birth year], 72 y.o.   MRN: [id number]  HPI Amanda Hancock is a 72 year old female who presents today to transfer from Dr. Dayton Martes.  1) Essential Hypertension: Currently managed on clonidine 0.1 mg TID. She was managed on amlodipine-benazepril 5-20 mg but she's not been taking as she was told to stop.   She's checking her BP at home which is running 180/80 on average. She is compliant to clonidine three times daily. She denies chest pain, shortness of breath. Some dizziness in the morning. Also with headaches.   BP Readings from Last 3 Encounters:  12/08/17 (!) 184/100  10/16/16 140/80  10/15/16 (!) 162/70    2) Major Depression with Psychotic Features: Currently managed on haloperidol 1 mg, Seroquel 50 mg, Zoloft 200 mg, Trazodone 50 mg. Currently following with Dr. Ardyth Gal through Neurology. She is no longer seeing psychiatry. Overall she feels well managed, denies seeing anyone around the house, hearing voices. She will be undergoing MRI of the brain later this week.   Review of Systems  Eyes: Negative for visual disturbance.  Respiratory: Negative for shortness of breath.   Cardiovascular: Negative for chest pain.  Psychiatric/Behavioral: Negative for agitation and hallucinations. The patient is not nervous/anxious.        Past Medical History:  Diagnosis Date  . Abnormal Pap smear    ASCUS  . Anxiety   . Chronic kidney disease    KIDNEYSTONES  . Depression   . Elevated blood pressure (not hypertension)   . Hypertension   . Vaginal Pap smear with ASC-US and positive HPV 12/20/2010  . Vaginal Pap smear, abnormal      Social History   Socioeconomic History  . Marital status: Married    Spouse name: Not on file  . Number of children: Not on file  . Years of education: Not on file  . Highest education level: Not on file  Occupational History  . Not on file  Social Needs  . Financial resource strain: Not on file  .  Food insecurity:    Worry: Not on file    Inability: Not on file  . Transportation needs:    Medical: Not on file    Non-medical: Not on file  Tobacco Use  . Smoking status: Never Smoker  . Smokeless tobacco: Never Used  Substance and Sexual Activity  . Alcohol use: No  . Drug use: No  . Sexual activity: Not Currently  Lifestyle  . Physical activity:    Days per week: Not on file    Minutes per session: Not on file  . Stress: Not on file  Relationships  . Social connections:    Talks on phone: Not on file    Gets together: Not on file    Attends religious service: Not on file    Active member of club or organization: Not on file    Attends meetings of clubs or organizations: Not on file    Relationship status: Not on file  . Intimate partner violence:    Fear of current or ex partner: Not on file    Emotionally abused: Not on file    Physically abused: Not on file    Forced sexual activity: Not on file  Other Topics Concern  . Not on file  Social History Narrative   Married.   2 children, 3 grandchildren, 1 grandchild.   Does not have a living will.  Would desire CPR and life support if not futile.    Past Surgical History:  Procedure Laterality Date  . ABDOMINAL HYSTERECTOMY    . TUBAL LIGATION      Family History  Problem Relation Age of Onset  . Parkinsonism Mother   . Dementia Mother   . Heart attack Paternal Grandfather   . Diabetes Maternal Grandmother   . Heart attack Father   . Dementia Cousin   . Breast cancer Maternal Aunt   . Breast cancer Paternal Aunt     Allergies  Allergen Reactions  . Codeine Nausea And Vomiting and Other (See Comments)    headache  . Hctz [Hydrochlorothiazide]     Rash     Current Outpatient Medications on File Prior to Visit  Medication Sig Dispense Refill  . aspirin 81 MG tablet Take 81 mg by mouth daily.    . Cholecalciferol ([employer]S VITAMIN D) 2000 UNITS CAPS Take 1 capsule (2,000 Units total) by mouth daily. 30  each   . cloNIDine (CATAPRES) 0.1 MG tablet TAKE 1 TABLET BY MOUTH THREE TIMES DAILY 270 tablet 1  . haloperidol (HALDOL) 1 MG tablet 1 mg po qhs 30 tablet 1  . QUEtiapine (SEROQUEL) 50 MG tablet Take 75 mg by mouth daily.     . sertraline (ZOLOFT) 100 MG tablet TAKE 1 TABLET BY MOUTH ONCE DAILY (Patient taking differently: TAKE 2 TABLET BY MOUTH ONCE DAILY) 90 tablet 0  . traZODone (DESYREL) 50 MG tablet TAKE ONE TABLET BY MOUTH AT BEDTIME AS NEEDED FOR SLEEP 90 tablet 1   No current facility-administered medications on file prior to visit.     BP (!) 184/100   Pulse (!) 52   Temp 98 F (36.7 C) (Oral)   Ht 5' (1.524 m)   Wt 193 lb 4 oz (87.7 kg)   SpO2 94%   BMI 37.74 kg/m    Objective:   Physical Exam  Constitutional: She is oriented to person, place, and time. She appears well-nourished.  Neck: Neck supple.  Cardiovascular: Normal rate and regular rhythm.  Respiratory: Effort normal and breath sounds normal.  Neurological: She is alert and oriented to person, place, and time.  Skin: Skin is warm and dry.  Psychiatric: She has a normal mood and affect.           Assessment & Plan:

## 2017-12-08 NOTE — Assessment & Plan Note (Addendum)
Uncontrolled today. Has not had Lotrel in nearly one year. Stop clonidine as this is not best practice for treatment alone.  Rx for amlodipine-benazepril 5-20 mg sent to pharmacy. Will have her monitor BP at home. Follow up in 2-3 weeks for re-evaluation.

## 2017-12-08 NOTE — Assessment & Plan Note (Signed)
Following with neurology, continue current regimen. 

## 2017-12-08 NOTE — Addendum Note (Signed)
Addended by: Tawnya CrookSAMBATH, Caidence Higashi on: 12/08/2017 12:34 PM   Modules accepted: Orders

## 2017-12-12 ENCOUNTER — Ambulatory Visit: Payer: PPO

## 2017-12-23 ENCOUNTER — Ambulatory Visit (INDEPENDENT_AMBULATORY_CARE_PROVIDER_SITE_OTHER): Payer: PPO | Admitting: Primary Care

## 2017-12-23 ENCOUNTER — Encounter: Payer: Self-pay | Admitting: Primary Care

## 2017-12-23 VITALS — BP 146/84 | HR 79 | Temp 98.2°F | Ht 60.0 in | Wt 188.5 lb

## 2017-12-23 DIAGNOSIS — I10 Essential (primary) hypertension: Secondary | ICD-10-CM | POA: Diagnosis not present

## 2017-12-23 LAB — BASIC METABOLIC PANEL
BUN: 11 mg/dL (ref 6–23)
CALCIUM: 10.8 mg/dL — AB (ref 8.4–10.5)
CO2: 32 mEq/L (ref 19–32)
CREATININE: 0.85 mg/dL (ref 0.40–1.20)
Chloride: 105 mEq/L (ref 96–112)
GFR: 69.83 mL/min (ref >60.00)
Glucose, Bld: 103 mg/dL — ABNORMAL HIGH (ref 70–99)
Potassium: 3.8 mEq/L (ref 3.5–5.1)
SODIUM: 142 meq/L (ref 135–145)

## 2017-12-23 MED ORDER — AMLODIPINE BESYLATE 10 MG PO TABS: ORAL_TABLET | ORAL | 0 refills | Status: DC

## 2017-12-23 MED ORDER — LOSARTAN POTASSIUM 50 MG PO TABS: ORAL_TABLET | ORAL | 0 refills | Status: DC

## 2017-12-23 NOTE — Patient Instructions (Addendum)
Stop amlodipine-benazepril 5-20 mg for blood pressure.  Start amlodipine 10 mg for blood pressure.  Start losartan 50 mg for blood pressure.  Start monitoring your blood pressure more frequently at home, at least 3-4 times weekly for the next 2 weeks.   We will call you for readings in 2 weeks.  Stop by the lab prior to leaving today. I will notify you of your results once received.   It was a pleasure to see you today!

## 2017-12-23 NOTE — Assessment & Plan Note (Signed)
Improved, elevated home reading, also suspect benazepril to be causing cough. Stop amlodipine-benazepril, start increased dose of amlodipine 10 mg and losartan 50 mg. Will have her family phone in BP readings in 2 weeks. BMP pending.

## 2017-12-23 NOTE — Progress Notes (Signed)
Subjective:    Patient ID: Amanda Hancock, female    DOB: 08/11/1945, 72 y.o.   MRN: [id number]  HPI  Amanda Hancock is a 72 year old female who presents today for follow up of hypertension.   She was last evaluated on 12/08/17 as a transfer patient from Dr. Dayton Martes with reports of elevated blood pressure readings. She was previously managed solely on clonidine 0.1 mg TID. She was managed on amlodipine-benazepril 5-20 mg at one point but "was told to stop" this medication quite some time ago and is not sure why. Given elevated readings despite clonidine, her clonidine was stopped and her amlodipine-benazepril was resumed.  Since her last visit she's checked her BP at home once with a reading of 164/84. She has been coughing a lot at night since resuming amlodipine-benazepril. She denies rhinorrhea, sinus pressure, nasal congestion since resuming amlodipine-benazepril. She denies chest pain, dizziness.   BP Readings from Last 3 Encounters:  12/23/17 (!) 146/84  12/08/17 (!) 184/100  10/16/16 140/80     Review of Systems  HENT: Negative for rhinorrhea and sore throat.   Respiratory: Positive for cough. Negative for shortness of breath.   Cardiovascular: Negative for chest pain.  Neurological: Negative for dizziness and headaches.       Past Medical History:  Diagnosis Date  . Abnormal Pap smear    ASCUS  . Anxiety   . Chronic kidney disease    KIDNEYSTONES  . Depression   . Elevated blood pressure (not hypertension)   . Hypertension   . Vaginal Pap smear with ASC-US and positive HPV 12/20/2010  . Vaginal Pap smear, abnormal      Social History   Socioeconomic History  . Marital status: Married    Spouse name: Not on file  . Number of children: Not on file  . Years of education: Not on file  . Highest education level: Not on file  Occupational History  . Not on file  Social Needs  . Financial resource strain: Not on file  . Food insecurity:    Worry: Not on file   Inability: Not on file  . Transportation needs:    Medical: Not on file    Non-medical: Not on file  Tobacco Use  . Smoking status: Never Smoker  . Smokeless tobacco: Never Used  Substance and Sexual Activity  . Alcohol use: No  . Drug use: No  . Sexual activity: Not Currently  Lifestyle  . Physical activity:    Days per week: Not on file    Minutes per session: Not on file  . Stress: Not on file  Relationships  . Social connections:    Talks on phone: Not on file    Gets together: Not on file    Attends religious service: Not on file    Active member of club or organization: Not on file    Attends meetings of clubs or organizations: Not on file    Relationship status: Not on file  . Intimate partner violence:    Fear of current or ex partner: Not on file    Emotionally abused: Not on file    Physically abused: Not on file    Forced sexual activity: Not on file  Other Topics Concern  . Not on file  Social History Narrative   Married.   2 children, 3 grandchildren, 1 grandchild.   Does not have a living will.   Would desire CPR and life support if not futile.  Past Surgical History:  Procedure Laterality Date  . ABDOMINAL HYSTERECTOMY    . TUBAL LIGATION      Family History  Problem Relation Age of Onset  . Parkinsonism Mother   . Dementia Mother   . Heart attack Paternal Grandfather   . Diabetes Maternal Grandmother   . Heart attack Father   . Dementia Cousin   . Breast cancer Maternal Aunt   . Breast cancer Paternal Aunt     Allergies  Allergen Reactions  . Codeine Nausea And Vomiting and Other (See Comments)    headache  . Hctz [Hydrochlorothiazide]     Rash     Current Outpatient Medications on File Prior to Visit  Medication Sig Dispense Refill  . amLODipine-benazepril (LOTREL) 5-20 MG capsule Take 1 tablet by mouth once daily for blood pressure. 30 capsule 0  . aspirin 81 MG tablet Take 81 mg by mouth daily.    . Cholecalciferol ([employer]S VITAMIN  D) 2000 UNITS CAPS Take 1 capsule (2,000 Units total) by mouth daily. 30 each   . haloperidol (HALDOL) 1 MG tablet 1 mg po qhs 30 tablet 1  . QUEtiapine (SEROQUEL) 50 MG tablet Take 75 mg by mouth daily.     . sertraline (ZOLOFT) 100 MG tablet TAKE 1 TABLET BY MOUTH ONCE DAILY (Patient taking differently: TAKE 2 TABLET BY MOUTH ONCE DAILY) 90 tablet 0  . traZODone (DESYREL) 50 MG tablet TAKE ONE TABLET BY MOUTH AT BEDTIME AS NEEDED FOR SLEEP 90 tablet 1   No current facility-administered medications on file prior to visit.     BP (!) 146/84   Pulse 79   Temp 98.2 F (36.8 C) (Oral)   Ht 5' (1.524 m)   Wt 188 lb 8 oz (85.5 kg)   SpO2 95%   BMI 36.81 kg/m    Objective:   Physical Exam  Constitutional: She appears well-nourished.  Neck: Neck supple.  Cardiovascular: Normal rate and regular rhythm.  Respiratory: Effort normal and breath sounds normal.  Skin: Skin is warm and dry.           Assessment & Plan:

## 2017-12-24 ENCOUNTER — Encounter (INDEPENDENT_AMBULATORY_CARE_PROVIDER_SITE_OTHER): Payer: Self-pay

## 2017-12-29 ENCOUNTER — Encounter: Payer: Self-pay | Admitting: *Deleted

## 2017-12-29 ENCOUNTER — Encounter (INDEPENDENT_AMBULATORY_CARE_PROVIDER_SITE_OTHER): Payer: Self-pay

## 2018-01-05 ENCOUNTER — Ambulatory Visit
Admission: RE | Admit: 2018-01-05 | Discharge: 2018-01-05 | Disposition: A | Discharge status: Home / Self Care | Payer: PPO | Source: Ambulatory Visit | Attending: Neurology | Admitting: Neurology

## 2018-01-05 DIAGNOSIS — R51 Headache: Secondary | ICD-10-CM | POA: Insufficient documentation

## 2018-01-05 DIAGNOSIS — R208 Other disturbances of skin sensation: Secondary | ICD-10-CM | POA: Diagnosis not present

## 2018-01-05 DIAGNOSIS — R9082 White matter disease, unspecified: Secondary | ICD-10-CM | POA: Diagnosis not present

## 2018-01-05 DIAGNOSIS — I6782 Cerebral ischemia: Secondary | ICD-10-CM | POA: Diagnosis not present

## 2018-01-05 DIAGNOSIS — R519 Headache, unspecified: Secondary | ICD-10-CM

## 2018-01-05 DIAGNOSIS — F0390 Unspecified dementia without behavioral disturbance: Secondary | ICD-10-CM | POA: Diagnosis not present

## 2018-01-06 ENCOUNTER — Telehealth: Payer: Self-pay | Admitting: Primary Care

## 2018-01-06 NOTE — Telephone Encounter (Signed)
-----   Message from Doreene Nest, NP sent at 12/23/2017  9:31 AM EDT ----- Regarding: Blood Pressure Please call patient and ask for BP readings since we changed to amlodipine 10 mg and losartan 50 mg. Has her cough improved?

## 2018-01-07 NOTE — Telephone Encounter (Signed)
Per DPR, left detail message of Kate Clark's comments for patient to call back 

## 2018-01-12 NOTE — Telephone Encounter (Signed)
Per DPR, left detail message of Kate Clark's comments for patient to call back 

## 2018-01-16 ENCOUNTER — Telehealth: Payer: Self-pay | Admitting: Primary Care

## 2018-01-16 NOTE — Telephone Encounter (Signed)
Left message asking pt to call office  Regarding her bone density

## 2018-01-27 ENCOUNTER — Emergency Department: Payer: PPO

## 2018-01-27 ENCOUNTER — Other Ambulatory Visit: Payer: Self-pay

## 2018-01-27 ENCOUNTER — Emergency Department
Admission: EM | Admit: 2018-01-27 | Discharge: 2018-01-27 | Disposition: A | Discharge status: Home / Self Care | Payer: PPO | Attending: Emergency Medicine | Admitting: Emergency Medicine

## 2018-01-27 DIAGNOSIS — N189 Chronic kidney disease, unspecified: Secondary | ICD-10-CM | POA: Diagnosis not present

## 2018-01-27 DIAGNOSIS — R0602 Shortness of breath: Secondary | ICD-10-CM | POA: Diagnosis not present

## 2018-01-27 DIAGNOSIS — R079 Chest pain, unspecified: Secondary | ICD-10-CM | POA: Insufficient documentation

## 2018-01-27 DIAGNOSIS — R404 Transient alteration of awareness: Secondary | ICD-10-CM | POA: Diagnosis not present

## 2018-01-27 DIAGNOSIS — I10 Essential (primary) hypertension: Secondary | ICD-10-CM | POA: Diagnosis not present

## 2018-01-27 DIAGNOSIS — R110 Nausea: Secondary | ICD-10-CM | POA: Diagnosis not present

## 2018-01-27 DIAGNOSIS — R0789 Other chest pain: Secondary | ICD-10-CM | POA: Diagnosis not present

## 2018-01-27 DIAGNOSIS — I129 Hypertensive chronic kidney disease with stage 1 through stage 4 chronic kidney disease, or unspecified chronic kidney disease: Secondary | ICD-10-CM | POA: Diagnosis not present

## 2018-01-27 LAB — CBC
HCT: 38.1 % (ref 36.0–46.0)
Hemoglobin: 12.7 g/dL (ref 12.0–15.0)
MCH: 29.1 pg (ref 26.0–34.0)
MCHC: 33.3 g/dL (ref 30.0–36.0)
MCV: 87.2 fL (ref 80.0–100.0)
PLATELETS: 137 10*3/uL — AB (ref 150–400)
RBC: 4.37 MIL/uL (ref 3.87–5.11)
RDW: 14.2 % (ref 11.5–15.5)
WBC: 7.1 10*3/uL (ref 4.0–10.5)
nRBC: 0 % (ref 0.0–0.2)

## 2018-01-27 LAB — BASIC METABOLIC PANEL
Anion gap: 3 — ABNORMAL LOW (ref 5–15)
BUN: 16 mg/dL (ref 8–23)
CHLORIDE: 110 mmol/L (ref 98–111)
CO2: 29 mmol/L (ref 22–32)
Calcium: 9.8 mg/dL (ref 8.9–10.3)
Creatinine, Ser: 0.74 mg/dL (ref 0.44–1.00)
GFR calc Af Amer: >60 mL/min (ref >60)
GLUCOSE: 125 mg/dL — AB (ref 70–99)
POTASSIUM: 3.9 mmol/L (ref 3.5–5.1)
Sodium: 142 mmol/L (ref 135–145)

## 2018-01-27 LAB — TROPONIN I
Troponin I: <0.03 ng/mL (ref <0.03)
Troponin I: <0.03 ng/mL (ref <0.03)

## 2018-01-27 MED ORDER — NITROGLYCERIN 2 % TD OINT
1.0000 [in_us] | TOPICAL_OINTMENT | Freq: Once | TRANSDERMAL | Status: AC
  Administered 2018-01-27: 1 [in_us] via TOPICAL
  Filled 2018-01-27: qty 1

## 2018-01-27 NOTE — Discharge Instructions (Signed)
Please make a follow up appointment with your primary care physician and with your family's cardiologist.  Turn to the emergency department if you develop severe pain, lightheadedness or fainting, palpitations,

## 2018-01-27 NOTE — ED Notes (Signed)
Pt up to toilet 

## 2018-01-27 NOTE — ED Notes (Signed)
Report given to Henry RN 

## 2018-01-27 NOTE — ED Notes (Signed)
Patient transported to X-ray 

## 2018-01-27 NOTE — ED Triage Notes (Signed)
Pt come via EMS from home with chest pain. EMS states pt was driven to fire department by husband for c/o chest pain. EMS gave 1 nitro and 324 aspirin. BP-146/85, HR-65, 97% room air, CBG-143. Pt c/o left sided chest pain.   Pt denies any N/V and SHOB. Pt has hx of dementia. Pt is alert.

## 2018-01-27 NOTE — ED Provider Notes (Signed)
[facility] Emergency Department Provider Note  ____________________________________________  Time seen: Approximately 4:10 PM  I have reviewed the triage vital signs and the nursing notes.   HISTORY  Chief Complaint Chest Pain    HPI Amanda Hancock is a 72 y.o. female of HTN, dementia, presenting for chest pain.  The patient reports that she was watching television at 2:45 PM when she had an acute onset of a sharp chest pain in the upper part of the left chest radiating to the left shoulder with associated shortness of breath.  She had some mild nausea without vomiting.  No diaphoresis, palpitations, lightheadedness or syncope.  Her husband drove her to the nearest fire department, where she received chewable aspirin and sublingual nitroglycerin.  She reports that after the second sublingual nitroglycerin, her pain eased off and has now completely resolved.  She has not been experiencing any similar pain over the last couple of days or weeks; she denies any chest pain with exertion.  She denies any lower extremity swelling or calf pain.  She has not had any burping or epigastric pain.  The pain was not worse with deep breaths.  The patient has never had a risk stratification study, including stress test or cardiac catheterization.  Past Medical History:  Diagnosis Date  . Abnormal Pap smear    ASCUS  . Anxiety   . Chronic kidney disease    KIDNEYSTONES  . Depression   . Elevated blood pressure (not hypertension)   . Hypertension   . Vaginal Pap smear with ASC-US and positive HPV 12/20/2010  . Vaginal Pap smear, abnormal     Patient Active Problem List   Diagnosis Date Noted  . Major depression with psychotic features (HCC) 10/16/2016  . Well woman exam 10/11/2016  . Hypercalcemia 10/09/2013  . Vitamin D deficiency 10/09/2013  . Vaginal Pap smear with LGSIL, positive HRHPV Type 16 03/16/2013  . HTN (hypertension) 01/28/2011    Past Surgical History:   Procedure Laterality Date  . ABDOMINAL HYSTERECTOMY    . TUBAL LIGATION      Current Outpatient Rx  . Order #: [id number] Class: Normal  . Order #: [id number] Class: Historical Med  . Order #: [id number] Class: OTC  . Order #: [id number] Class: Normal  . Order #: [id number] Class: Normal  . Order #: [id number] Class: Historical Med  . Order #: [id number] Class: Normal  . Order #: [id number] Class: Normal    Allergies Codeine; Ace inhibitors; and Hctz [hydrochlorothiazide]  Family History  Problem Relation Age of Onset  . Parkinsonism Mother   . Dementia Mother   . Heart attack Paternal Grandfather   . Diabetes Maternal Grandmother   . Heart attack Father   . Dementia Cousin   . Breast cancer Maternal Aunt   . Breast cancer Paternal Aunt     Social History Social History   Tobacco Use  . Smoking status: Never Smoker  . Smokeless tobacco: Never Used  Substance Use Topics  . Alcohol use: No  . Drug use: No    Review of Systems Constitutional: No fever/chills.  No lightheadedness or syncope.  No diaphoresis. Eyes: No visual changes. ENT: No sore throat. No congestion or rhinorrhea. Cardiovascular: Positive chest pain. Denies palpitations. Respiratory: Positive shortness of breath.  No cough. Gastrointestinal: No abdominal pain.  No nausea, no vomiting.  No diarrhea.  No constipation. Genitourinary: Negative for dysuria. Musculoskeletal: Negative for back pain.  No lower extremity swelling or calf pain. Skin: Negative for rash. Neurological: Negative for headaches. No  focal numbness, tingling or weakness.     ____________________________________________   PHYSICAL EXAM:  VITAL SIGNS: ED Triage Vitals  Enc Vitals Group     BP 01/27/18 1551 (!) 178/92     Pulse Rate 01/27/18 1551 72     Resp 01/27/18 1551 13     Temp 01/27/18 1551 97.9 F (36.6 C)     Temp Source 01/27/18 1551 Oral     SpO2 01/27/18 1551 97 %     Weight 01/27/18 1544 170 lb (77.1 kg)     Height  01/27/18 1544 5' (1.524 m)     Head Circumference --      Peak Flow --      Pain Score 01/27/18 1544 6     Pain Loc --      Pain Edu? --      Excl. in GC? --     Constitutional: The patient is alert and answers questions appropriately.  Well appearing and in no acute distress.  Eyes: Conjunctivae are normal.  EOMI. No scleral icterus. Head: Atraumatic. Nose: No congestion/rhinnorhea. Mouth/Throat: Mucous membranes are moist.  Neck: No stridor.  Supple.  No JVD.  No meningismus. Cardiovascular: Normal rate, regular rhythm. No murmurs, rubs or gallops.  Respiratory: Normal respiratory effort.  No accessory muscle use or retractions. Lungs CTAB.  No wheezes, rales or ronchi. Gastrointestinal: Obese.  Soft, nontender and nondistended.  No guarding or rebound.  No peritoneal signs. Musculoskeletal: Mild nonpitting symmetric LE edema isolated to around the ankles. No ttp in the calves or palpable cords.  Negative Homan's sign. Neurologic:  A&Ox3.  Speech is clear.  Face and smile are symmetric.  EOMI.  Moves all extremities well. Skin:  Skin is warm, dry and intact. No rash noted. Psychiatric: Mood and affect are normal.   ____________________________________________   LABS (all labs ordered are listed, but only abnormal results are displayed)  Labs Reviewed  CBC - Abnormal; Notable for the following components:      Result Value   Platelets 137 (*)    All other components within normal limits  BASIC METABOLIC PANEL - Abnormal; Notable for the following components:   Glucose, Bld 125 (*)    Anion gap 3 (*)    All other components within normal limits  TROPONIN I  TROPONIN I   ____________________________________________  EKG  ED ECG REPORT I, Anne-Caroline Sharma Covert, the attending physician, personally viewed and interpreted this ECG.   Date: 01/27/2018  EKG Time: 1550  Rate: 65  Rhythm: normal sinus rhythm  Axis: normal  Intervals:none  ST&T Change: No  STEMI  ____________________________________________  RADIOLOGY  Dg Chest 2 View  Result Date: 01/27/2018 CLINICAL DATA:  Onset of chest pain EXAM: CHEST - 2 VIEW COMPARISON:  None. FINDINGS: Top-normal heart size. Minimal aortic atherosclerosis is noted at the arch without aneurysm. No acute pulmonary consolidation or CHF. Osteoarthritis of the [facility] and glenohumeral joints. IMPRESSION: No active cardiopulmonary disease. Electronically Signed   By: Tollie Eth M.D.   On: 01/27/2018 16:38    ____________________________________________   PROCEDURES  Procedure(s) performed: None  Procedures  Critical Care performed: No ____________________________________________   INITIAL IMPRESSION / ASSESSMENT AND PLAN / ED COURSE  Pertinent labs & imaging results that were available during my care of the patient were reviewed by me and considered in my medical decision making (see chart for details).  72 y.o. female with a history of hypertension and dementia presenting for an episode of left upper chest pain  and left shoulder pain with associated shortness of breath and nausea, now resolved.  Overall, the patient is mildly hypertensive at 178/92 and I will give her nitroglycerin paste to prevent return of her chest pain and to improve her blood pressure.  The patient's EKG is reassuring without any ischemic changes or arrhythmias and her troponin is pending.  Given that the onset of symptoms was at 2:45 PM, we will need a second troponin for complete evaluation.  ACS or MI is possible although at this point appears less likely as a pathology for the patient's symptoms.  I would also consider a GI cause for her symptoms including esophageal spasm, or gas.  PE is much less likely given that the patient is not hypoxic, tachycardic, has no evidence of DVT and no personal or family history of blood clots; her chest pain was nonpleuritic.  Aortic pathology is also considered but unlikely.  Plan reevaluation  for final disposition.  ED course: The patient's work-up in the emergency department was reassuring.  She did not have any additional episodes of chest pain while she was here and she remained hemodynamically stable.  Her laboratory studies showed normal blood counts, troponin was negative x2 and normal electrolytes.  Her chest x-ray showed no active cardiopulmonary disease.  At discharge, I had a lengthy discussion with the patient, her husband, and her daughter about the need to follow-up with a cardiologist in a prefer to take her to a family cardiologist that they have.  Return precautions were discussed.  The patient was discharged in stable condition.  ____________________________________________  FINAL CLINICAL IMPRESSION(S) / ED DIAGNOSES  Final diagnoses:  Chest pain, unspecified type  Shortness of breath  Nausea without vomiting         NEW MEDICATIONS STARTED DURING THIS VISIT:  Discharge Medication List as of 01/27/2018  9:22 PM        Rockne Menghini, MD 01/27/18 2326

## 2018-01-28 ENCOUNTER — Telehealth: Payer: Self-pay

## 2018-01-28 NOTE — Telephone Encounter (Signed)
Left message for patient to call back to schedule ER appointment with Vernona RiegerKatherine Clark to follow up .

## 2018-02-11 ENCOUNTER — Ambulatory Visit (INDEPENDENT_AMBULATORY_CARE_PROVIDER_SITE_OTHER): Payer: PPO | Admitting: Primary Care

## 2018-02-11 ENCOUNTER — Encounter: Payer: Self-pay | Admitting: Primary Care

## 2018-02-11 DIAGNOSIS — I10 Essential (primary) hypertension: Secondary | ICD-10-CM | POA: Diagnosis not present

## 2018-02-11 DIAGNOSIS — R079 Chest pain, unspecified: Secondary | ICD-10-CM

## 2018-02-11 NOTE — Assessment & Plan Note (Signed)
Single episode last week, no symptoms since. ED work up unremarkable.  Discussed risks of heart disease which include hypertension. Discussed her BP goal to be less than 140/90. LDL at 81 from labs in September 2019. Discussed return precautions.  Exam unremarkable. All labs, imaging, notes reviewed from ED visit.

## 2018-02-11 NOTE — Assessment & Plan Note (Signed)
Borderline. Given symptoms would like to see her below 140/90. Based off of home readings it appears she is meeting this goal.   Discussed for family to continue to monitor BP and report readings consistently at or above 140/90. They verbalized understanding.

## 2018-02-11 NOTE — Patient Instructions (Addendum)
Continue to monitor your blood pressure daily and report readings that are consistently at or above 140/90.  Please update me if you notice your chest pain return, especially when you are active.   It was a pleasure to see you today!

## 2018-02-11 NOTE — Progress Notes (Signed)
Subjective:    Patient ID: Amanda Hancock, female    DOB: 05-01-1945, 72 y.o.   MRN: [id number]  HPI  Amanda Hancock is a 72 year old female who presents today for emergency department follow up.  She presented to [facility] ED on 01/27/18 with a chief complaint of chest pain.  She was watching TV earlier that day when she noticed sudden onset of sharp chest pain to the left chest with radiation to the left shoulder with shortness of breath and mild nausea. Her husband took her to the fire department who provided her with chewable aspirin and nitroglycerin. The second nitroglycerin provided relief of symptoms by the time she reached the ED.  During her ED visit her ECG was without ischemic changes or arrhythmias. Chest xray was unremarkable. Troponin labs were negative. She had no episodes of chest pain during her stay so she was discharged home with recommendations for cardiology evaluation.   Since her ED visit she denies chest pain, shortness of breath nausea. Her daughter is with her today who states they were told that her symptoms were likely secondary to anxiety. They have not set up a cardiology visit as they were under the impression to do this if her symptoms returned.   She denies chest pain and shortness of breath on exertion normally. Her daughter checks her BP which runs 120-140/70-80's. The patient reports that she felt increased anxiety while at the fire department.  BP Readings from Last 3 Encounters:  02/11/18 (!) 142/72  01/27/18 (!) 175/75  12/23/17 (!) 146/84     Review of Systems  Respiratory: Negative for shortness of breath.   Cardiovascular: Negative for chest pain.  Neurological: Negative for dizziness and headaches.  Psychiatric/Behavioral: The patient is not nervous/anxious.        Past Medical History:  Diagnosis Date  . Abnormal Pap smear    ASCUS  . Anxiety   . Chronic kidney disease    KIDNEYSTONES  . Depression   . Elevated blood pressure (not  hypertension)   . Hypertension   . Vaginal Pap smear with ASC-US and positive HPV 12/20/2010  . Vaginal Pap smear, abnormal      Social History   Socioeconomic History  . Marital status: Married    Spouse name: Not on file  . Number of children: Not on file  . Years of education: Not on file  . Highest education level: Not on file  Occupational History  . Not on file  Social Needs  . Financial resource strain: Not on file  . Food insecurity:    Worry: Not on file    Inability: Not on file  . Transportation needs:    Medical: Not on file    Non-medical: Not on file  Tobacco Use  . Smoking status: Never Smoker  . Smokeless tobacco: Never Used  Substance and Sexual Activity  . Alcohol use: No  . Drug use: No  . Sexual activity: Not Currently  Lifestyle  . Physical activity:    Days per week: Not on file    Minutes per session: Not on file  . Stress: Not on file  Relationships  . Social connections:    Talks on phone: Not on file    Gets together: Not on file    Attends religious service: Not on file    Active member of club or organization: Not on file    Attends meetings of clubs or organizations: Not on file  Relationship status: Not on file  . Intimate partner violence:    Fear of current or ex partner: Not on file    Emotionally abused: Not on file    Physically abused: Not on file    Forced sexual activity: Not on file  Other Topics Concern  . Not on file  Social History Narrative   Married.   2 children, 3 grandchildren, 1 grandchild.   Does not have a living will.   Would desire CPR and life support if not futile.    Past Surgical History:  Procedure Laterality Date  . ABDOMINAL HYSTERECTOMY    . TUBAL LIGATION      Family History  Problem Relation Age of Onset  . Parkinsonism Mother   . Dementia Mother   . Heart attack Paternal Grandfather   . Diabetes Maternal Grandmother   . Heart attack Father   . Dementia Cousin   . Breast cancer  Maternal Aunt   . Breast cancer Paternal Aunt     Allergies  Allergen Reactions  . Codeine Nausea And Vomiting and Other (See Comments)    headache  . Ace Inhibitors Cough  . Hctz [Hydrochlorothiazide]     Rash     Current Outpatient Medications on File Prior to Visit  Medication Sig Dispense Refill  . amLODipine (NORVASC) 10 MG tablet Take 1 tablet by mouth once daily for blood pressure. 90 tablet 0  . aspirin 81 MG tablet Take 81 mg by mouth daily.    . Cholecalciferol ([employer]S VITAMIN D) 2000 UNITS CAPS Take 1 capsule (2,000 Units total) by mouth daily. 30 each   . haloperidol (HALDOL) 1 MG tablet 1 mg po qhs 30 tablet 1  . losartan (COZAAR) 50 MG tablet Take 1 tablet by mouth once daily for blood pressure. 90 tablet 0  . MELATONIN PO Take by mouth at bedtime as needed.    . Multiple Vitamins-Minerals (MULTIVITAMIN ADULT PO) Take by mouth.    . QUEtiapine (SEROQUEL) 50 MG tablet Take 75 mg by mouth daily.     . sertraline (ZOLOFT) 100 MG tablet TAKE 1 TABLET BY MOUTH ONCE DAILY (Patient taking differently: TAKE 2 TABLET BY MOUTH ONCE DAILY) 90 tablet 0  . traZODone (DESYREL) 50 MG tablet TAKE ONE TABLET BY MOUTH AT BEDTIME AS NEEDED FOR SLEEP 90 tablet 1   No current facility-administered medications on file prior to visit.     BP (!) 142/72   Pulse 63   Temp 98.1 F (36.7 C) (Oral)   Ht 5' (1.524 m)   Wt 189 lb 8 oz (86 kg)   SpO2 95%   BMI 37.01 kg/m    Objective:   Physical Exam  Constitutional: She appears well-nourished.  Neck: Neck supple.  Cardiovascular: Normal rate and regular rhythm.  Respiratory: Effort normal and breath sounds normal.  Skin: Skin is warm and dry.  Psychiatric: She has a normal mood and affect.           Assessment & Plan:

## 2018-03-15 ENCOUNTER — Other Ambulatory Visit: Payer: Self-pay | Admitting: Primary Care

## 2018-03-15 DIAGNOSIS — I10 Essential (primary) hypertension: Secondary | ICD-10-CM

## 2018-03-23 ENCOUNTER — Ambulatory Visit
Admission: RE | Admit: 2018-03-23 | Discharge: 2018-03-23 | Disposition: A | Discharge status: Home / Self Care | Payer: PPO | Source: Ambulatory Visit | Attending: Primary Care | Admitting: Primary Care

## 2018-03-23 DIAGNOSIS — E2839 Other primary ovarian failure: Secondary | ICD-10-CM | POA: Insufficient documentation

## 2018-03-23 DIAGNOSIS — M85851 Other specified disorders of bone density and structure, right thigh: Secondary | ICD-10-CM | POA: Diagnosis not present

## 2018-09-12 ENCOUNTER — Other Ambulatory Visit: Payer: Self-pay | Admitting: Primary Care

## 2018-09-12 DIAGNOSIS — I10 Essential (primary) hypertension: Secondary | ICD-10-CM

## 2018-09-24 ENCOUNTER — Other Ambulatory Visit: Payer: Self-pay | Admitting: Primary Care

## 2018-09-24 DIAGNOSIS — I10 Essential (primary) hypertension: Secondary | ICD-10-CM

## 2018-11-28 ENCOUNTER — Other Ambulatory Visit: Payer: Self-pay | Admitting: Primary Care

## 2018-11-28 DIAGNOSIS — I10 Essential (primary) hypertension: Secondary | ICD-10-CM

## 2018-11-28 DIAGNOSIS — E559 Vitamin D deficiency, unspecified: Secondary | ICD-10-CM

## 2018-11-28 DIAGNOSIS — R739 Hyperglycemia, unspecified: Secondary | ICD-10-CM

## 2018-12-01 ENCOUNTER — Telehealth: Payer: Self-pay

## 2018-12-01 NOTE — Telephone Encounter (Signed)
Left detailed VM w COVID screen and back door lab info and front door info   

## 2018-12-02 ENCOUNTER — Other Ambulatory Visit: Payer: PPO

## 2018-12-03 ENCOUNTER — Other Ambulatory Visit (INDEPENDENT_AMBULATORY_CARE_PROVIDER_SITE_OTHER): Payer: PPO

## 2018-12-03 ENCOUNTER — Other Ambulatory Visit: Payer: Self-pay

## 2018-12-03 DIAGNOSIS — R739 Hyperglycemia, unspecified: Secondary | ICD-10-CM | POA: Diagnosis not present

## 2018-12-03 DIAGNOSIS — I10 Essential (primary) hypertension: Secondary | ICD-10-CM

## 2018-12-03 LAB — LIPID PANEL
Cholesterol: 159 mg/dL (ref 0–200)
HDL: 38.2 mg/dL — ABNORMAL LOW (ref >39.00)
LDL Cholesterol: 102 mg/dL — ABNORMAL HIGH (ref 0–99)
NonHDL: 120.65
Total CHOL/HDL Ratio: 4
Triglycerides: 93 mg/dL (ref 0.0–149.0)
VLDL: 18.6 mg/dL (ref 0.0–40.0)

## 2018-12-03 LAB — CBC
HCT: 39.4 % (ref 36.0–46.0)
Hemoglobin: 13.2 g/dL (ref 12.0–15.0)
MCHC: 33.5 g/dL (ref 30.0–36.0)
MCV: 88.2 fl (ref 78.0–100.0)
Platelets: 148 10*3/uL — ABNORMAL LOW (ref 150.0–400.0)
RBC: 4.47 Mil/uL (ref 3.87–5.11)
RDW: 13.9 % (ref 11.5–15.5)
WBC: 6.1 10*3/uL (ref 4.0–10.5)

## 2018-12-03 LAB — COMPREHENSIVE METABOLIC PANEL
ALT: 11 U/L (ref 0–35)
AST: 13 U/L (ref 0–37)
Albumin: 3.7 g/dL (ref 3.5–5.2)
Alkaline Phosphatase: 109 U/L (ref 39–117)
BUN: 17 mg/dL (ref 6–23)
CO2: 32 mEq/L (ref 19–32)
Calcium: 10.5 mg/dL (ref 8.4–10.5)
Chloride: 109 mEq/L (ref 96–112)
Creatinine, Ser: 0.92 mg/dL (ref 0.40–1.20)
GFR: 59.81 mL/min — ABNORMAL LOW (ref >60.00)
Glucose, Bld: 73 mg/dL (ref 70–99)
Potassium: 4.6 mEq/L (ref 3.5–5.1)
Sodium: 144 mEq/L (ref 135–145)
Total Bilirubin: 0.5 mg/dL (ref 0.2–1.2)
Total Protein: 6.2 g/dL (ref 6.0–8.3)

## 2018-12-03 LAB — HEMOGLOBIN A1C: Hgb A1c MFr Bld: 5 % (ref 4.6–6.5)

## 2018-12-09 ENCOUNTER — Ambulatory Visit (INDEPENDENT_AMBULATORY_CARE_PROVIDER_SITE_OTHER): Payer: PPO | Admitting: Primary Care

## 2018-12-09 ENCOUNTER — Encounter: Payer: Self-pay | Admitting: Primary Care

## 2018-12-09 ENCOUNTER — Other Ambulatory Visit: Payer: Self-pay

## 2018-12-09 VITALS — BP 134/72 | HR 61 | Temp 97.9°F | Ht 60.0 in | Wt 181.0 lb

## 2018-12-09 DIAGNOSIS — Z23 Encounter for immunization: Secondary | ICD-10-CM

## 2018-12-09 DIAGNOSIS — R350 Frequency of micturition: Secondary | ICD-10-CM

## 2018-12-09 DIAGNOSIS — Z0000 Encounter for general adult medical examination without abnormal findings: Secondary | ICD-10-CM

## 2018-12-09 DIAGNOSIS — F0150 Vascular dementia without behavioral disturbance: Secondary | ICD-10-CM

## 2018-12-09 DIAGNOSIS — F0280 Dementia in other diseases classified elsewhere without behavioral disturbance: Secondary | ICD-10-CM

## 2018-12-09 DIAGNOSIS — I10 Essential (primary) hypertension: Secondary | ICD-10-CM

## 2018-12-09 DIAGNOSIS — G309 Alzheimer's disease, unspecified: Secondary | ICD-10-CM | POA: Diagnosis not present

## 2018-12-09 DIAGNOSIS — F323 Major depressive disorder, single episode, severe with psychotic features: Secondary | ICD-10-CM

## 2018-12-09 LAB — POC URINALSYSI DIPSTICK (AUTOMATED)
Bilirubin, UA: NEGATIVE
Blood, UA: NEGATIVE
Glucose, UA: NEGATIVE
Ketones, UA: NEGATIVE
Leukocytes, UA: NEGATIVE
Nitrite, UA: NEGATIVE
Protein, UA: NEGATIVE
Spec Grav, UA: 1.015 (ref 1.010–1.025)
Urobilinogen, UA: 0.2 E.U./dL
pH, UA: 6 (ref 5.0–8.0)

## 2018-12-09 NOTE — Patient Instructions (Signed)
Call the neurologist for a follow up appointment.  Start exercising. You should be getting 150 minutes of exercise weekly.  Continue to work on a healthy diet. Increase vegetables, fruit, whole grains, lean protein.   Please have the imaging center send me your mammogram.   Make sure to take calcium and vitamin D daily.   It was a pleasure to see you today!   Preventive Care 73 Years and Older, Female Preventive care refers to lifestyle choices and visits with your health care provider that can promote health and wellness. This includes:  A yearly physical exam. This is also called an annual well check.  Regular dental and eye exams.  Immunizations.  Screening for certain conditions.  Healthy lifestyle choices, such as diet and exercise. What can I expect for my preventive care visit? Physical exam Your health care provider will check:  Height and weight. These may be used to calculate body mass index (BMI), which is a measurement that tells if you are at a healthy weight.  Heart rate and blood pressure.  Your skin for abnormal spots. Counseling Your health care provider may ask you questions about:  Alcohol, tobacco, and drug use.  Emotional well-being.  Home and relationship well-being.  Sexual activity.  Eating habits.  History of falls.  Memory and ability to understand (cognition).  Work and work Statistician.  Pregnancy and menstrual history. What immunizations do I need?  Influenza (flu) vaccine  This is recommended every year. Tetanus, diphtheria, and pertussis (Tdap) vaccine  You may need a Td booster every 10 years. Varicella (chickenpox) vaccine  You may need this vaccine if you have not already been vaccinated. Zoster (shingles) vaccine  You may need this after age 14. Pneumococcal conjugate (PCV13) vaccine  One dose is recommended after age 62. Pneumococcal polysaccharide (PPSV23) vaccine  One dose is recommended after age  2. Measles, mumps, and rubella (MMR) vaccine  You may need at least one dose of MMR if you were born in 1957 or later. You may also need a second dose. Meningococcal conjugate (MenACWY) vaccine  You may need this if you have certain conditions. Hepatitis A vaccine  You may need this if you have certain conditions or if you travel or work in places where you may be exposed to hepatitis A. Hepatitis B vaccine  You may need this if you have certain conditions or if you travel or work in places where you may be exposed to hepatitis B. Haemophilus influenzae type b (Hib) vaccine  You may need this if you have certain conditions. You may receive vaccines as individual doses or as more than one vaccine together in one shot (combination vaccines). Talk with your health care provider about the risks and benefits of combination vaccines. What tests do I need? Blood tests  Lipid and cholesterol levels. These may be checked every 5 years, or more frequently depending on your overall health.  Hepatitis C test.  Hepatitis B test. Screening  Lung cancer screening. You may have this screening every year starting at age 41 if you have a 30-pack-year history of smoking and currently smoke or have quit within the past 15 years.  Colorectal cancer screening. All adults should have this screening starting at age 57 and continuing until age 70. Your health care provider may recommend screening at age 61 if you are at increased risk. You will have tests every 1-10 years, depending on your results and the type of screening test.  Diabetes screening. This  is done by checking your blood sugar (glucose) after you have not eaten for a while (fasting). You may have this done every 1-3 years.  Mammogram. This may be done every 1-2 years. Talk with your health care provider about how often you should have regular mammograms.  BRCA-related cancer screening. This may be done if you have a family history of  breast, ovarian, tubal, or peritoneal cancers. Other tests  Sexually transmitted disease (STD) testing.  Bone density scan. This is done to screen for osteoporosis. You may have this done starting at age 70. Follow these instructions at home: Eating and drinking  Eat a diet that includes fresh fruits and vegetables, whole grains, lean protein, and low-fat dairy products. Limit your intake of foods with high amounts of sugar, saturated fats, and salt.  Take vitamin and mineral supplements as recommended by your health care provider.  Do not drink alcohol if your health care provider tells you not to drink.  If you drink alcohol: ? Limit how much you have to 0-1 drink a day. ? Be aware of how much alcohol is in your drink. In the [place] one drink equals one 12 oz bottle of beer (355 mL), one 5 oz glass of wine (148 mL), or one 1 oz glass of hard liquor (44 mL). Lifestyle  Take daily care of your teeth and gums.  Stay active. Exercise for at least 30 minutes on 5 or more days each week.  Do not use any products that contain nicotine or tobacco, such as cigarettes, e-cigarettes, and chewing tobacco. If you need help quitting, ask your health care provider.  If you are sexually active, practice safe sex. Use a condom or other form of protection in order to prevent STIs (sexually transmitted infections).  Talk with your health care provider about taking a low-dose aspirin or statin. What's next?  Go to your health care provider once a year for a well check visit.  Ask your health care provider how often you should have your eyes and teeth checked.  Stay up to date on all vaccines. This information is not intended to replace advice given to you by your health care provider. Make sure you discuss any questions you have with your health care provider. Document Released: 03/31/2015 Document Revised: 02/26/2018 Document Reviewed: 02/26/2018 Elsevier Patient Education  2020 [employer].

## 2018-12-09 NOTE — Progress Notes (Signed)
Subjective:    Patient ID: Amanda Hancock, female    DOB: [birth year], 73 y.o.   MRN: [id number]  HPI  Amanda Hancock is a 73 year old female who presents today for complete physical. Her daughter is with her today who is providing information for her HPI.  Immunizations: -Influenza: Due today -Pneumonia: Completed last in 2017 -Shingles: Completed in 2012   Diet: She endorses a healthy diet. Home cooked meals mostly lean protein, vegetables, starch. Infrequent desserts. Snacks on chips. Drinking mostly water, Ginger Ale, lemonade, soda.  Exercise: She is not exercising, some walking  Eye exam: Completed in 2019 Dental exam: No recent exam Colonoscopy: Completed in 2015, unknown when to return.  Mammogram: Completed in 2020 per patients daughter Dexa: Completed in 2020, osteopenia Hep C Screen: Negative in 2016  BP Readings from Last 3 Encounters:  12/09/18 134/72  02/11/18 (!) 142/72  01/27/18 (!) 175/75     Review of Systems  Constitutional: Negative for unexpected weight change.  HENT: Negative for rhinorrhea.   Respiratory: Negative for cough and shortness of breath.   Cardiovascular: Negative for chest pain.  Gastrointestinal: Negative for constipation and diarrhea.  Genitourinary: Negative for difficulty urinating.  Musculoskeletal: Negative for arthralgias and myalgias.  Skin: Negative for rash.  Allergic/Immunologic: Negative for environmental allergies.  Neurological: Positive for headaches. Negative for dizziness and numbness.  Psychiatric/Behavioral: Positive for sleep disturbance. Negative for hallucinations.       Daughter reports patient will have a bowel movement and then pick up her feces and try to hand it to her family. More wandering, getting lost in the dark in her house.        Past Medical History:  Diagnosis Date  . Abnormal Pap smear    ASCUS  . Anxiety   . Chronic kidney disease    KIDNEYSTONES  . Depression   . Elevated blood pressure  (not hypertension)   . Hypertension   . Vaginal Pap smear with ASC-US and positive HPV 12/20/2010  . Vaginal Pap smear, abnormal      Social History   Socioeconomic History  . Marital status: Married    Spouse name: Not on file  . Number of children: Not on file  . Years of education: Not on file  . Highest education level: Not on file  Occupational History  . Not on file  Social Needs  . Financial resource strain: Not on file  . Food insecurity    Worry: Not on file    Inability: Not on file  . Transportation needs    Medical: Not on file    Non-medical: Not on file  Tobacco Use  . Smoking status: Never Smoker  . Smokeless tobacco: Never Used  Substance and Sexual Activity  . Alcohol use: No  . Drug use: No  . Sexual activity: Not Currently  Lifestyle  . Physical activity    Days per week: Not on file    Minutes per session: Not on file  . Stress: Not on file  Relationships  . Social Musician on phone: Not on file    Gets together: Not on file    Attends religious service: Not on file    Active member of club or organization: Not on file    Attends meetings of clubs or organizations: Not on file    Relationship status: Not on file  . Intimate partner violence    Fear of current or ex partner: Not on  file    Emotionally abused: Not on file    Physically abused: Not on file    Forced sexual activity: Not on file  Other Topics Concern  . Not on file  Social History Narrative   Married.   2 children, 3 grandchildren, 1 grandchild.   Does not have a living will.   Would desire CPR and life support if not futile.    Past Surgical History:  Procedure Laterality Date  . ABDOMINAL HYSTERECTOMY    . TUBAL LIGATION      Family History  Problem Relation Age of Onset  . Parkinsonism Mother   . Dementia Mother   . Heart attack Paternal Grandfather   . Diabetes Maternal Grandmother   . Heart attack Father   . Dementia Cousin   . Breast cancer  Maternal Aunt   . Breast cancer Paternal Aunt     Allergies  Allergen Reactions  . Codeine Nausea And Vomiting and Other (See Comments)    headache  . Ace Inhibitors Cough  . Hctz [Hydrochlorothiazide]     Rash     Current Outpatient Medications on File Prior to Visit  Medication Sig Dispense Refill  . amLODipine (NORVASC) 10 MG tablet Take 1 tablet by mouth once daily for blood pressure 90 tablet 1  . aspirin 81 MG tablet Take 81 mg by mouth daily.    . Cyanocobalamin (B-12 PO) Take 2,000 mcg by mouth daily.    . Cyanocobalamin (B-12) 2000 MCG TABS Take by mouth.    . losartan (COZAAR) 50 MG tablet Take 1 tablet by mouth once daily for blood pressure 90 tablet 0  . MELATONIN PO Take by mouth at bedtime as needed.    . Multiple Vitamins-Minerals (MULTIVITAMIN ADULT PO) Take by mouth.    . QUEtiapine (SEROQUEL) 100 MG tablet Take 200 mg by mouth at bedtime.    . sertraline (ZOLOFT) 100 MG tablet TAKE 1 TABLET BY MOUTH ONCE DAILY (Patient taking differently: Take 100 mg by mouth at bedtime. ) 90 tablet 0   No current facility-administered medications on file prior to visit.     BP 134/72   Pulse 61   Temp 97.9 F (36.6 C) (Temporal)   Ht 5' (1.524 m)   Wt 181 lb (82.1 kg)   SpO2 98%   BMI 35.35 kg/m    Objective:   Physical Exam  Constitutional: She appears well-nourished.  HENT:  Right Ear: Tympanic membrane and ear canal normal.  Left Ear: Tympanic membrane and ear canal normal.  Mouth/Throat: Oropharynx is clear and moist.  Eyes: Pupils are equal, round, and reactive to light. EOM are normal.  Neck: Neck supple.  Cardiovascular: Normal rate and regular rhythm.  Respiratory: Effort normal and breath sounds normal.  GI: Soft. Bowel sounds are normal. There is no abdominal tenderness.  Musculoskeletal: Normal range of motion.  Neurological: She is alert.  Answers some questions appropriately.   Skin: Skin is warm and dry.  Psychiatric: She has a normal mood and  affect.           Assessment & Plan:

## 2018-12-09 NOTE — Assessment & Plan Note (Signed)
Following with neurology and is due for follow up. 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  Recommended to her daughter that she set up a follow up visit with neurologist soon.

## 2018-12-09 NOTE — Assessment & Plan Note (Signed)
Stable in the office today. Continue current regimen. CMP reviewed.

## 2018-12-09 NOTE — Assessment & Plan Note (Signed)
Immunizations UTD, influenza vaccination UTD. Mammogram completed in 2020 per daughter, she will have them send records.  Colonoscopy completed in 2015, family unsure of when to return.  Bone density scan UTD, discussed to take calcium and vitamin D daily. Encouraged a healthy diet with regular activity. Exam today stable. Labs reviewed.

## 2018-12-09 NOTE — Assessment & Plan Note (Signed)
Daughter endorses overall doing well with Zoloft 100 mg. Continue same.

## 2018-12-15 ENCOUNTER — Other Ambulatory Visit: Payer: Self-pay | Admitting: Primary Care

## 2018-12-15 DIAGNOSIS — I10 Essential (primary) hypertension: Secondary | ICD-10-CM

## 2019-01-08 DIAGNOSIS — F0150 Vascular dementia without behavioral disturbance: Secondary | ICD-10-CM | POA: Diagnosis not present

## 2019-01-08 DIAGNOSIS — G309 Alzheimer's disease, unspecified: Secondary | ICD-10-CM | POA: Diagnosis not present

## 2019-01-08 DIAGNOSIS — R519 Headache, unspecified: Secondary | ICD-10-CM | POA: Diagnosis not present

## 2019-01-08 DIAGNOSIS — F0280 Dementia in other diseases classified elsewhere without behavioral disturbance: Secondary | ICD-10-CM | POA: Diagnosis not present

## 2019-01-08 DIAGNOSIS — Z8659 Personal history of other mental and behavioral disorders: Secondary | ICD-10-CM | POA: Diagnosis not present

## 2019-01-08 DIAGNOSIS — F419 Anxiety disorder, unspecified: Secondary | ICD-10-CM | POA: Diagnosis not present

## 2019-02-04 DIAGNOSIS — F0150 Vascular dementia without behavioral disturbance: Secondary | ICD-10-CM | POA: Diagnosis not present

## 2019-02-04 DIAGNOSIS — Z8659 Personal history of other mental and behavioral disorders: Secondary | ICD-10-CM | POA: Diagnosis not present

## 2019-02-04 DIAGNOSIS — G309 Alzheimer's disease, unspecified: Secondary | ICD-10-CM | POA: Diagnosis not present

## 2019-02-04 DIAGNOSIS — F0280 Dementia in other diseases classified elsewhere without behavioral disturbance: Secondary | ICD-10-CM | POA: Diagnosis not present

## 2019-02-08 IMAGING — MR MR HEAD W/O CM
10 series · 48 of 48 positions shown · non-contrast
Comparison: CT 12/13/2013

CLINICAL DATA: Memory loss and falling beginning last year,
worsened over the last 3 months.

EXAM:
MRI HEAD WITHOUT CONTRAST
TECHNIQUE: Multiplanar, multiecho pulse sequences of the brain and surrounding
structures were obtained without intravenous contrast.

[Series 2: T1 · sagittal · 5.0mm · 0.45mm/px · 3 of 27 slices shown (1 of 2)]
[im 1/27]
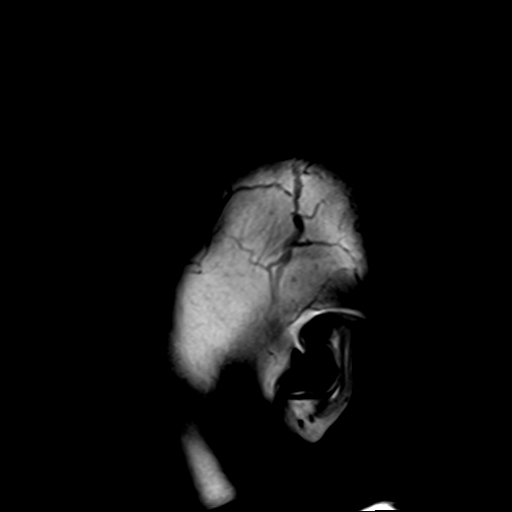
[im 14/27]
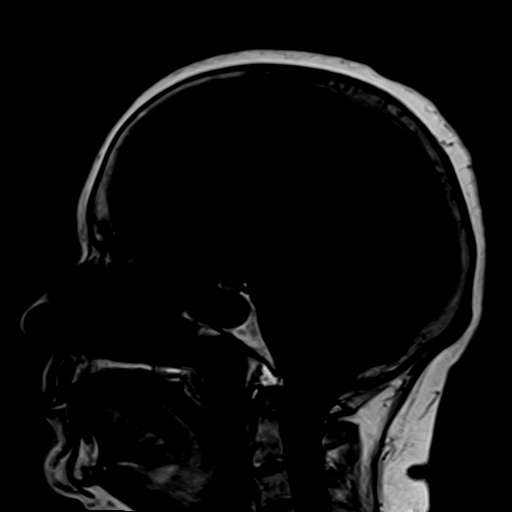
[im 27/27]
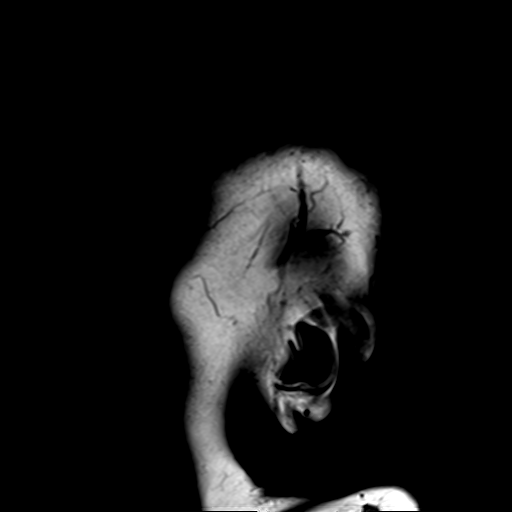

[Series 4: DWI · axial · 3.0mm · 1.80mm/px · z∈[-39,+120]mm · 5 of 54 slices shown (1 of 2)]
[im 1/54]
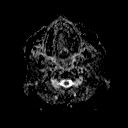
[im 14/54]
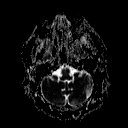
[im 27/54]
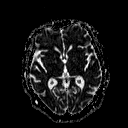
[im 40/54]
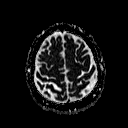
[im 54/54]
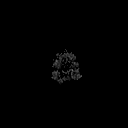

[Series 6: DWI · coronal · 3.0mm · 1.80mm/px · 4 of 45 slices shown (2 of 2)]
[im 1/45]
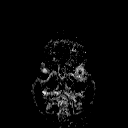
[im 15/45]
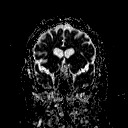
[im 30/45]
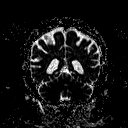
[im 45/45]
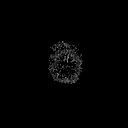

[Series 7: T2 · axial · 5.0mm · 0.60mm/px · z∈[-36,+117]mm · 2 of 25 slices shown (1 of 3)]
[im 1/25]
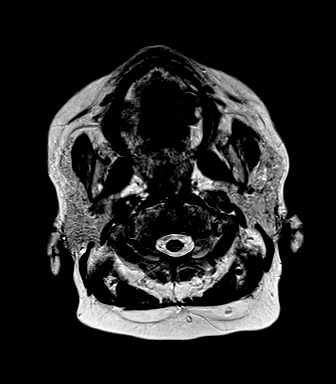
[im 25/25]
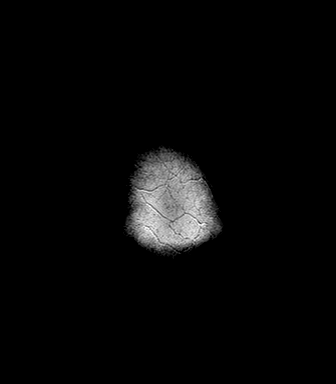

[Series 8: FLAIR · axial · 3.0mm · 0.45mm/px · z∈[-36,+117]mm · 5 of 53 slices shown]
[im 1/53]
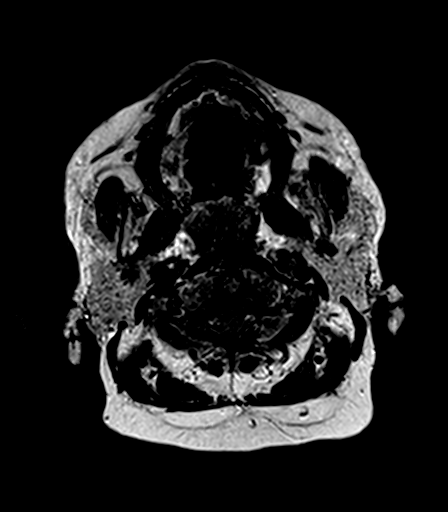
[im 14/53]
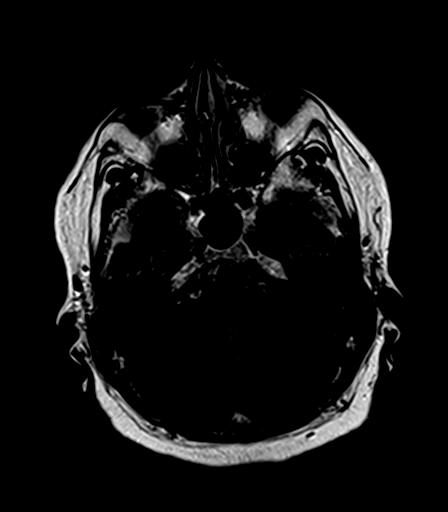
[im 27/53]
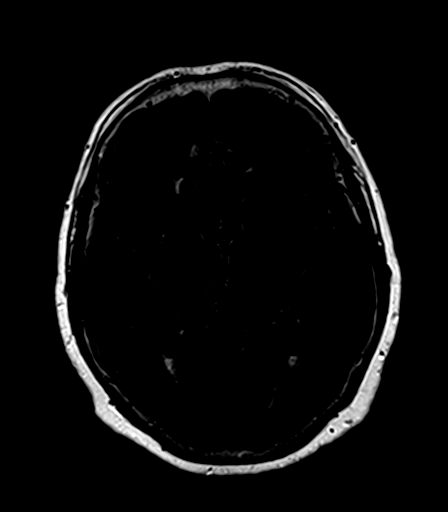
[im 40/53]
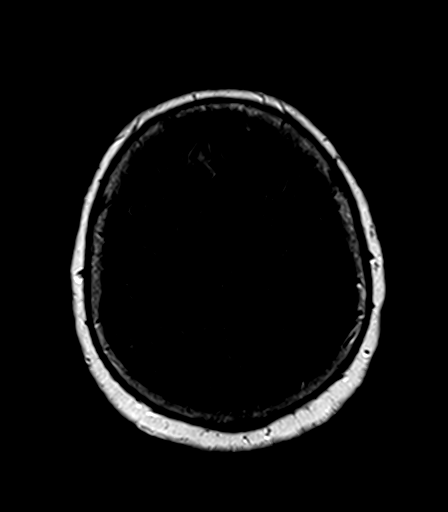
[im 53/53]
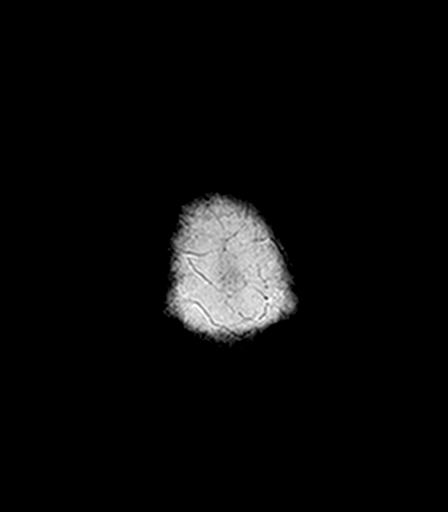

[Series 9: T2 · axial · 5.0mm · 0.45mm/px · z∈[-36,+117]mm · 2 of 25 slices shown (2 of 3)]
[im 1/25]
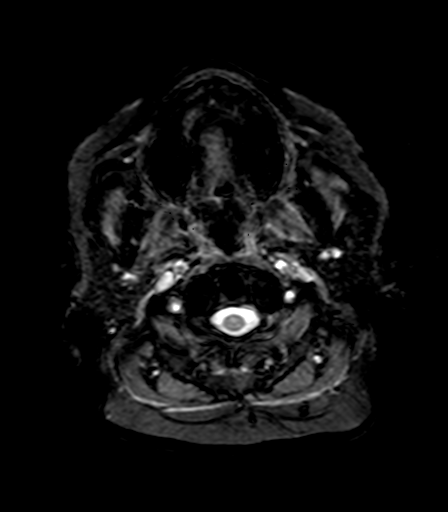
[im 25/25]
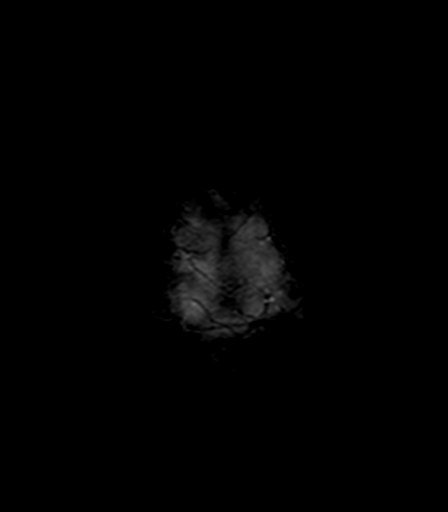

[Series 10: T1 · axial · 1.0mm · 1.00mm/px · z∈[-43,+129]mm · 16 of 176 slices shown (2 of 2)]
[im 1/176]
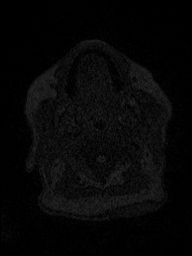
[im 12/176]
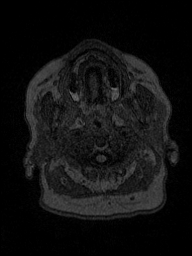
[im 24/176]
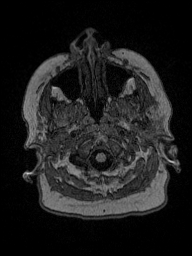
[im 36/176]
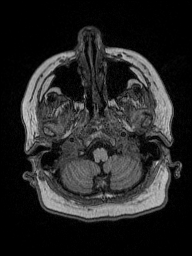
[im 47/176]
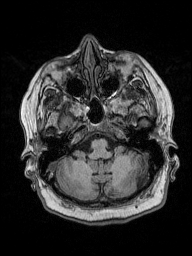
[im 59/176]
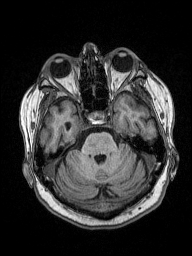
[im 71/176]
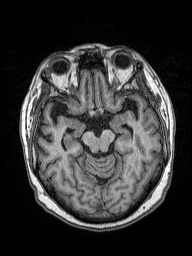
[im 82/176]
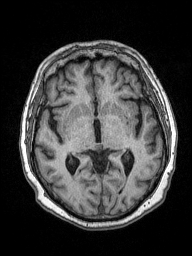
[im 94/176]
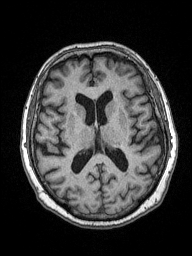
[im 106/176]
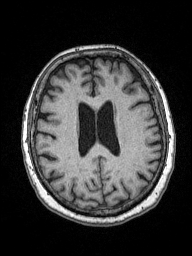
[im 117/176]
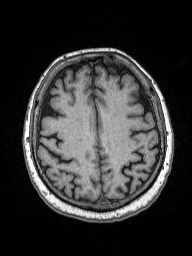
[im 129/176]
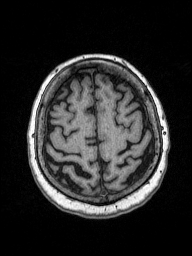
[im 141/176]
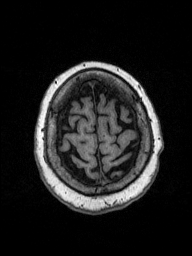
[im 152/176]
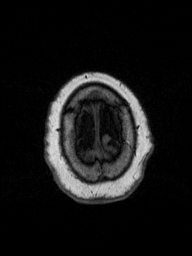
[im 164/176]
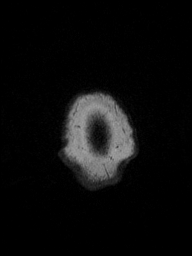
[im 176/176]
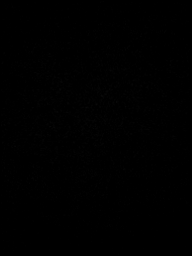

[Series 11: T2 · coronal · 5.0mm · 0.49mm/px · 2 of 27 slices shown (3 of 3)]
[im 1/27]
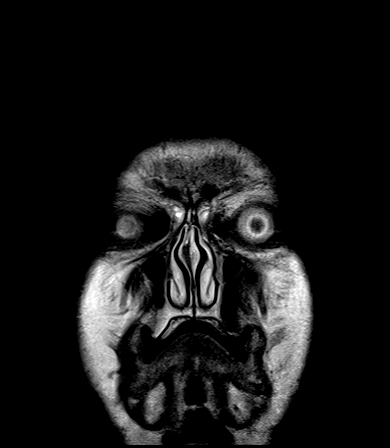
[im 27/27]
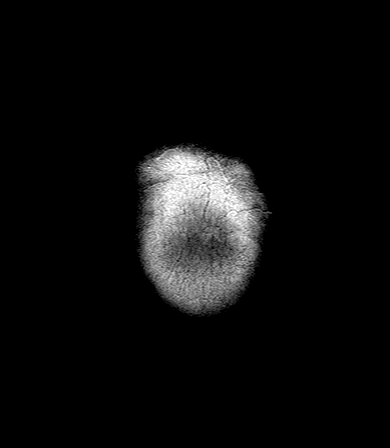

[Series 100: (id) · axial · 3.0mm · 1.80mm/px · z∈[-39,+120]mm · 5 of 55 slices shown]
[im 1/55]
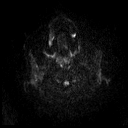
[im 14/55]
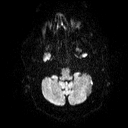
[im 28/55]
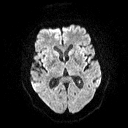
[im 41/55]
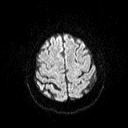
[im 55/55]
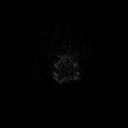

[Series 101: (id) cor · coronal · 3.0mm · 1.80mm/px · 4 of 44 slices shown]
[im 1/44]
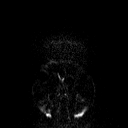
[im 15/44]
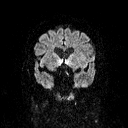
[im 29/44]
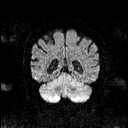
[im 44/44]
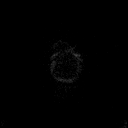

[48 of 48 positions shown; findings below may reference images not displayed]

FINDINGS: Brain: The brainstem and cerebellum are normal. Cerebral hemispheres
show mild chronic small-vessel ischemic changes of the deep and
subcortical white matter. T2 shine through of a left frontal white
matter infarction is not associated with true restricted diffusion.
There is focal atrophy and gliosis in the frontal lobes, right more
than left, with scattered punctate foci of hemosiderin deposition.
This suggests that the etiology relates to old head trauma. No
evidence of neoplastic mass lesion, hydrocephalus or extra-axial
collection.

Vascular: Major vessels at the base of the brain show flow.

Skull and upper cervical spine: Negative

Sinuses/Orbits: Clear/normal

Other: None significant
IMPRESSION: No acute or reversible finding. Mild chronic small-vessel ischemic
changes affecting the cerebral hemispheric white matter. Areas of
atrophy and gliosis in the frontal lobes, right more than left, with
scattered punctate foci of hemosiderin deposition. This probably
relates to old close head injury.

## 2019-02-09 ENCOUNTER — Telehealth: Payer: Self-pay

## 2019-02-09 NOTE — Telephone Encounter (Signed)
Telephone call to patients daughter Amanda Hancock to schedule palliative care visit.  Daughter in agreement with visit on Monday 11-30 20 at 2:30 PM.

## 2019-02-15 ENCOUNTER — Other Ambulatory Visit: Payer: PPO

## 2019-02-15 ENCOUNTER — Other Ambulatory Visit: Payer: Self-pay

## 2019-02-15 DIAGNOSIS — Z515 Encounter for palliative care: Secondary | ICD-10-CM

## 2019-02-15 NOTE — Progress Notes (Signed)
COMMUNITY PALLIATIVE CARE SW NOTE  PATIENT NAME: Amanda Hancock DOB: Aug 08, 1945 MRN: [id number]  PRIMARY CARE PROVIDER: Pleas Koch, NP  RESPONSIBLE PARTY:  Acct ID - Guarantor Home Phone Work Phone Relationship Acct Type  [id number] [name]  Self P/F     [place], [place],  [address]     PLAN OF CARE and INTERVENTIONS:             1. GOALS OF CARE/ ADVANCE CARE PLANNING:  Patient is a FULL CODE. Goal is for patient to get more care at home. Ongoing discussion. 2. SOCIAL/EMOTIONAL/SPIRITUAL ASSESSMENT/ INTERVENTIONS:  SW and RN met with patient, Bud (patient's husband) and [name] (patient's daughter). SW provided education on palliative care services. [name] provided brief history of patient and Bud provided update on care needs. Bud said patient is becoming more confused, asking "to go home" and needing more assistance with tolieting. Patient does experience hallucinations and has periods of anxiousness when Bud is not in her sight.  Patient has also wandered to the neighbors yard to "find Bud" without family knowing. Team discussed safety precautions - alarms, cameras, locks, etc. In discussion of tolieting concerns, team discussed family going with patient to the bathroom to provide direction and support. Patient does wear adult briefs. Patient's appetite is good, and Bud helps prepare meals. Patient is sleeping well other than getting up 3-4 times to go to the bathroom. Patient is retired, worked as a Regulatory affairs officer. Patient has been married to [facility] for 52 years. Patient has two adult children - [name] that lives across the street and a son in [place]. Patient has three grandchildren and one great grandchild. Patient enjoys when her grandchildren visit.  3. PATIENT/CAREGIVER EDUCATION/ COPING:  Patient was alert, oriented to self and place. Patient was calm during visit and would try to answer questions. Patient is confused at times. Discussed caregiver role and  caregiver fatigue. Bud expressed that he get overwhelmed and stressed. Daughter agreed and said that she tries to help. Encouraged family to support one another, take breaks and openly discuss feelings and concerns.  4. PERSONAL EMERGENCY PLAN:  Family will call 9-1-1 for emergencies.  5. COMMUNITY RESOURCES COORDINATION/ HEALTH CARE NAVIGATION:  [name] and Bud help coordinate care. Patient had telemedicine visit with neurologist on 11/19 when palliative care referral was initiated. RN contacted neurologist office to discuss medications. Bud said he pays a neighbor to sit with patient when he runs errands. Discussed scheduling her to come a few hours a week regularly to ensure that Bud has a break.  6. FINANCIAL/LEGAL CONCERNS/INTERVENTIONS:  [name] is going to look into applying for Medicaid. SW emailed information on [place] and how to apply online.      SOCIAL HX:  Social History   Tobacco Use  . Smoking status: Never Smoker  . Smokeless tobacco: Never Used  Substance Use Topics  . Alcohol use: No    CODE STATUS:   Code Status: Not on file (FULL CODE) ADVANCED DIRECTIVES: N MOST FORM COMPLETE:  No. HOSPICE EDUCATION PROVIDED: None.  PPS: Patient needs standby assist with ADLs for safety. Patient's ambulating with a cane.  I spent 60 minutes with patient/family, from 2:30-3:30p providing education, support and consultation.   Margaretmary Lombard, LCSW

## 2019-02-15 NOTE — Progress Notes (Signed)
PATIENT NAME: Amanda Hancock DOB: 09/29/1945 MRN: [id number]  PRIMARY CARE PROVIDER: Clark, Katherine K, NP  RESPONSIBLE PARTY:  Acct ID - Guarantor Home Phone Work Phone Relationship Acct Type  [id number] - Houchen,[name] [phone]  Self P/F     [address], [place], [place] [address]    PLAN OF CARE and INTERVENTIONS:               1.  GOALS OF CARE/ ADVANCE CARE PLANNING:  Remain in home with husband for as long as care can be managed.               2.  PATIENT/CAREGIVER EDUCATION:  Education on safety precautions as patient goes outside, education on fall precautions, reviewed meds, support   3.  DISEASE STATUS: SW and RN made joint home care visit to explain palliative care services.  SW and RN met with patient, patients husband and and daughter [name]. Patient sitting in recliner chair in living room with husband and daughter. Family reports patient has Lewy Body dementia, Alzheimer's and vascular dementia. Patient sees neurologist Dr Shah. Family reports patient is having more confusion. Husband reports patient worries if he is away from her for any length of time. Daughter states patients husband gets upset and frustrated with patient. Patient also getting up several times during the night to urinate. Daughter States MD made recommendation for Abilify for patient. Daughter also reports patient has issues with bowel movements. Family reports patient will want to use bathroom in floor or in trash can. Patient denies having pain at the present time. Patient attempts to inform palliative care team of her mother and grandmother having dementia. Patient has no cough or shortness of breath. Patient wears Pull-Ups for incontinence. Patient has had no recent hospitalizations. Patient appetite is good and patient can feed herself. Patient has trace edema in her lower extremities. Patient is on no medications for dementia. Nurse placed call to Dr. Shah's office to clarify why patient was not on any  dementia meds. Nurse also requested order for Abilify to the patient's urinary frequency. Education to family on fall precautions for patient as well as safety precautions should patient wonder out of house. Team made recommendation to install alarms on doors for safety. Team also made recommendation for daughter to file Medicaid application as family is wanting someone to stay with patient if patient qualifies for PCS services.  Family appreciative of palliative care visit. Family encouraged to contact palliative care with questions or concerns.      HISTORY OF PRESENT ILLNESS:  Patient is a 73 year old patient who resides in home with husband.  Daughter [name] lives across the street.  Patient will be seen monthly and PRN by PMPM team.      CODE STATUS: Full Code  ADVANCED DIRECTIVES: N MOST FORM: No PPS: 50%   PHYSICAL EXAM:   VITALS: Today's Vitals   02/15/19 1515  BP: (!) 164/84  Pulse: 60  Resp: 18  Temp: (!) 97.3 F (36.3 C)  TempSrc: Temporal  SpO2: 97%  PainSc: 0-No pain    LUNGS: clear to auscultation  CARDIAC: Cor RRR  EXTREMITIES: Trace edema SKIN: Skin color, texture, turgor normal. No rashes or lesions  NEURO: positive for gait problems and memory problems        , RN 

## 2019-02-17 ENCOUNTER — Telehealth: Payer: Self-pay

## 2019-02-17 NOTE — Telephone Encounter (Signed)
Telephone call to patients daughter Leroy Kennedy to inform her Dr Manuella Ghazi called in RX for Abilify for patient.  Christi to check with pharmacy to see if RX is ready and pick medication up from pharmacy.

## 2019-02-26 ENCOUNTER — Telehealth: Payer: Self-pay

## 2019-02-26 NOTE — Telephone Encounter (Signed)
[facility] Night - Client Nonclinical Telephone Record AccessNurse Client [facility] Night - Client Client Site [place] Physician Alma Friendly - NP Contact Type Call Who Is Calling Patient / Member / Family / Caregiver Caller Name [place] Phone Number [phone] Patient Name Naela Nodal Patient DOB 09/08/2045 Call Type Message Only Information Provided Reason for Call Request to Schedule Office Appointment Initial Comment Caller states she needs to make an appt. Additional Comment declines triage. Disp. Time Disposition Final User 02/26/2019 7:37:25 AM General Information Provided Yes Green, Amy Call Closed By: Philis Kendall Transaction Date/Time: 02/26/2019 7:35:10 AM (ET)

## 2019-03-03 NOTE — Telephone Encounter (Signed)
Called to get patient scheduled for an appointment. I talked to her daughter, Margreta Journey. She states she attempted to reach out the night of 12/10 and twice on 12/11. States patient had blood in her urine and she was frustrated she didn't hear back. I apologized and told her I only saw the one note from her calling in. She declined triage. She states pt is better and doesn't have blood in her urine anymore.

## 2019-03-15 ENCOUNTER — Telehealth: Payer: Self-pay

## 2019-03-15 NOTE — Telephone Encounter (Signed)
Telephone call to patient to schedule palliative care visit with patient. Patient/family in agreement with home visit on 03-16-19 at 12:00 PM.

## 2019-03-16 ENCOUNTER — Other Ambulatory Visit: Payer: PPO

## 2019-03-16 ENCOUNTER — Other Ambulatory Visit: Payer: Self-pay

## 2019-03-16 DIAGNOSIS — Z515 Encounter for palliative care: Secondary | ICD-10-CM

## 2019-03-16 NOTE — Progress Notes (Signed)
COMMUNITY PALLIATIVE CARE SW NOTE  PATIENT NAME: Amanda Hancock DOB: Nov 29, 1945 MRN: [id number]  PRIMARY CARE PROVIDER: Pleas Koch, NP  RESPONSIBLE PARTY:  Acct ID - Guarantor Home Phone Work Phone Relationship Acct Type  [id number] Amanda [phone]  Self P/F     [name], [name], [name] [address]     PLAN OF CARE and INTERVENTIONS:             1. GOALS OF CARE/ ADVANCE CARE PLANNING:  Patient is a FULL CODE. Goal is for patient to be cared for at home.  2. SOCIAL/EMOTIONAL/SPIRITUAL ASSESSMENT/ INTERVENTIONS:  SW and RN met with patient and Amanda Hancock (patient's husband) in the home. Patient reports pain in her stomach region and Amanda Hancock noted that patient's pants were too tight yesterday and caused some discomfort. Patient's appetite is good. Patient's family brings her meals. Patient is sleeping well. Patient is going to the bathroom frequently and does note strong smell from her urine, denies any burning. Patient did have blood in her urine last week but nothing since this one time. Patient does wear briefs and said that when she has accidents, she feels stupid. SW to encourage Amanda Hancock to discuss concerns with her urine with PCP.  3. PATIENT/CAREGIVER EDUCATION/ COPING:  Patient was alert, oriented to self and place. Patient continues to have periods of anxiousness, especially when she cannot see Amanda Hancock. Patient was tearful at times discussing her cognitive challenges, stating "I wish I wasn't this way". SW provided emotional support and encouraged patient to focus on what patient enjoys and feels comfortable doing. Patient enjoys her family and their visits. Amanda Hancock discussed caregiver challenges and how he becomes frustrated with patient and himself. SW encouraged Amanda Hancock to take breaks, remind himself that patient is struggling as he is and ask for help when he needs it. SW also encouraged ongoing family discussions and communication of needs. 4. PERSONAL EMERGENCY PLAN:  Family will call  9-1-1 for emergencies.  5. COMMUNITY RESOURCES COORDINATION/ HEALTH CARE NAVIGATION:  Amanda Hancock coordinates care. Amanda Hancock is primary caregiver in the home. Amanda Hancock has a neighbor that comes to sit with patient so he can run errands. SW encouraged Amanda Hancock to schedule this regularly for him to take breaks.  6. FINANCIAL/LEGAL CONCERNS/INTERVENTIONS:  None.     SOCIAL HX:  Social History   Tobacco Use  . Smoking status: Never Smoker  . Smokeless tobacco: Never Used  Substance Use Topics  . Alcohol use: No    CODE STATUS:   Code Status: Not on file (FULL CODE) ADVANCED DIRECTIVES: N MOST FORM COMPLETE:  No. HOSPICE EDUCATION PROVIDED: None.  PPS: Patient needs standby assist for safety with ADLs. Patient's ambulating with a cane.  I spent36mnutes with patient/family, from12:00-12:45pproviding education, support and consultation.  [name] LCSW

## 2019-03-20 ENCOUNTER — Other Ambulatory Visit: Payer: Self-pay | Admitting: Primary Care

## 2019-03-20 DIAGNOSIS — I10 Essential (primary) hypertension: Secondary | ICD-10-CM

## 2019-04-06 ENCOUNTER — Telehealth: Payer: Self-pay

## 2019-04-06 NOTE — Telephone Encounter (Signed)
Patients daughter returned RN's call and is in agreement with RN making home visit 04/07/19 at 10:30 AM.

## 2019-04-06 NOTE — Telephone Encounter (Signed)
Telephone call to patients daughter Trula Ore to schedule palliative care visit.  RN LM requesting call back to schedule visit.

## 2019-04-07 ENCOUNTER — Other Ambulatory Visit: Payer: PPO

## 2019-04-07 ENCOUNTER — Other Ambulatory Visit: Payer: Self-pay

## 2019-04-07 DIAGNOSIS — Z515 Encounter for palliative care: Secondary | ICD-10-CM

## 2019-04-07 NOTE — Progress Notes (Signed)
PATIENT NAME: Amanda Hancock DOB: 1945/06/15 MRN: [id number]  PRIMARY CARE PROVIDER: Doreene Nest, NP  RESPONSIBLE PARTY:  Acct ID - Guarantor Home Phone Work Phone Relationship Acct Type  [id number] [name] [phone]  Self P/F     [address], [place], [place] [address]    PLAN OF CARE and INTERVENTIONS:               1.  GOALS OF CARE/ ADVANCE CARE PLANNING:  Remain in home with husband for as long as patients care can be managed.               2.  PATIENT/CAREGIVER EDUCATION:  Education on disease progression, education on s/s of infection, support               3.  DISEASE STATUS :SW made scheduled palliative care visit. Patient sitting in chair in den,  patients husband in home with patient. Patient alert to self and familiars. Husband reports patient went outside and Lucila Maine brought patient back into home. Husband also reports patient was found walk in neighbors garden. Husband states he has placed device on door so patient cannot get outside or follow him outside. Patient report she has growling sound in her stomach. Husband reports patient has issues with constipation. Patient is not on stool softener.  RN  made recommendation for Colace 1-2 capsules daily. Husband reports patient will go into the bathroom then will call for him to come and help her. Husband states he has to assist patients with pulling down her pants as patient seems not to remember what to do when she gets into the bathroom. Patients Sertraline was increased. Husband report some nights patient sleeps well and other nights patient will wake up early and not sleep as well. Husband admits to feeling frustrated as he feels patient should know what to do in the bathroom and not wake him once he when she wakes up. Education to husband that as patients dementia progresses patient will need more coaching and reminders on how to do simple tasks. Nurse talks with husband about PACE program.  RN placed call to SW to ask SW  to talk with husband about PACE services and if patient would qualify for PACE services.  Patient denies having any shortness of breath. Patient has an occasional non productive cough. Husband reports patients appetite remains good. Patient has no edema in her lower extremities. Patient has not suffered any recent falls. Patient pleasant and cooperative for assessment. Husband remains in agreement with palliative care services. Husband encouraged to contact palliative care with questions or concerns.      HISTORY OF PRESENT ILLNESS: Patient is a 74 year old female who resides in home with husband.  Patient being followed by palliative care services and is seen monthly and PRN.    CODE STATUS: Full Code  ADVANCED DIRECTIVES:N{ MOST FORM: N PPS: 50%   PHYSICAL EXAM:   VITALS: Today's Vitals   04/07/19 1435  BP: (!) 142/70  Pulse: 64  Resp: 16  Temp: 98.1 F (36.7 C)  TempSrc: Temporal  SpO2: 96%  Weight: 182 lb (82.6 kg)  PainSc: 0-No pain    LUNGS: clear to ausculation CARDIAC: Cor RRR EXTREMITIES: Trace edema SKIN: Skin color, texture, turgor normal. No rashes or lesions  NEURO: positive for memory problems       Glory Rosebush, RN

## 2019-04-28 ENCOUNTER — Telehealth: Payer: Self-pay

## 2019-04-28 NOTE — Telephone Encounter (Signed)
Telephone call to schedule palliative care visit.  Patients daughter Amanda Hancock did not answer phone. RN left message requesting call back to schedule palliative care visit.

## 2019-04-29 ENCOUNTER — Telehealth: Payer: Self-pay

## 2019-04-29 NOTE — Telephone Encounter (Signed)
Telephone call to patients daughter Danford Bad to schedule palliative care visit.  Daughter did not answer phone.  RN left message requesting call back to schedule palliative care visit.

## 2019-05-06 ENCOUNTER — Telehealth: Payer: Self-pay

## 2019-05-06 NOTE — Telephone Encounter (Signed)
Telephone call to patients daughter Amanda Hancock to schedule palliative care visit.  RN left message requesting call back to schedule palliative care visit.

## 2019-05-07 ENCOUNTER — Telehealth: Payer: Self-pay

## 2019-05-07 NOTE — Telephone Encounter (Signed)
Telephone call to schedule palliative care visit.  Daughter Christi in agreement with RN making home visit 05/13/19 at 11:00 AM.

## 2019-05-13 ENCOUNTER — Telehealth: Payer: Self-pay

## 2019-05-13 ENCOUNTER — Other Ambulatory Visit: Payer: Self-pay

## 2019-05-13 ENCOUNTER — Other Ambulatory Visit: Payer: PPO

## 2019-05-13 DIAGNOSIS — Z515 Encounter for palliative care: Secondary | ICD-10-CM

## 2019-05-13 NOTE — Telephone Encounter (Signed)
SW contacted Trula Ore (patient's daughter) to follow-up on RN visit. Trula Ore said they were considered a family meeting with her brother, but have decided against this at this time. SW encouraged Trula Ore to reach out to other family and friends. Trula Ore is also going to contact a private duty aide to discuss options. Trula Ore discussed concerns for patient's husband and his stress in the caregiving role. Trula Ore said she is trying to support him but she works and helps care for her granddaughter. SW encouraged Trula Ore to discuss the importance of allowing help with patient's husband. Trula Ore is going to talk with him and with additional extended family. Trula Ore also asked for referral to Meals on Wheels, SW to complete referral. Trula Ore was appreciative of time and information. SW validated concerns, provided emotional support and discussed care and caregivers role.

## 2019-05-13 NOTE — Telephone Encounter (Signed)
SW contacted Surveyor, [employer] to [employer] on [employer].

## 2019-05-13 NOTE — Progress Notes (Signed)
PATIENT NAME: Amanda Hancock DOB: [birth year] MRN: [id number]  PRIMARY CARE PROVIDER: Pleas Koch, NP  RESPONSIBLE PARTY:  Acct ID - Guarantor Home Phone Work Phone Relationship Acct Type  [id number] Amanda [phone]  Self P/F     [place], [place] [address]    PLAN OF CARE and INTERVENTIONS:               1.  GOALS OF CARE/ ADVANCE CARE PLANNING: Remain in home with husband for as long as patient s care can be managed.               2.  PATIENT/CAREGIVER EDUCATION:  Education on fall precautions, education on dementia disease progression, reviewed meds, support               3.  DISEASE STATUS:RN made scheduled palliative care home visit. Nurse met with patient patients husband and their daughter Amanda Hancock.  Patient sitting in chair in living room watching TV. Patient denies pain at the present time. Husband reports patient will sometimes complain of pain in lower abdomen. Daughter reports patient has complained with this pain for the  past 2 years and nothing has been found By MD to cause patients abdominal pain.   Husband reports patients bowels have been moving better. Patient also hit her head on wall has a small knot on back of head. Husband reports patient has not had any nausea or vomiting or did not lose consciousness after patient hit her head. Patients appetite remains adequate and husband states he feels patient is not eating quite as much. Husband assists patient with getting her bath. Husband states patient does not like to get her hair shampooed. Patient also complaining of having intermittent pain in her left leg that she describes as muscle cramping type pain. Daughter to pick up some low-dose potassium for patient and husband states he keeps bananas in the home for patient. Patient has been doing better with sleeping at night per husband.  Husband states patient typically goes to bed between 9:30 and 10 PM and will sometimes sleep until 10 AM the  Next morning.   Daughter request Meals on Wheels referral be made for patient as husband has a difficult time preparing meals. Husband also suffered a fall and daughter concerned about him continuing to be able to provide personal care to patient.  Daughter to reach out to other family members to see if that other family members can assist with patients care. Patient remains confused and family has installed locks on doors as patient with wonder out side. Husband reports if he goes outside patient will  move things around in the house flush things down the commode and get into things she shouldn't.  Patient has had no changes in current medications. Patient's vital signs are stable. Social worker Amanda Hancock updated and to contact daughter about Meals on Wheels referral. Family remains in agreement with palliative care services and all were encouraged to contact how to care with questions or concerns.      HISTORY OF PRESENT ILLNESS:    CODE STATUS: Full Code  ADVANCED DIRECTIVES:No MOST FORM: No PPS: 50%   PHYSICAL EXAM:   VITALS: Today's Vitals   05/13/19 1130  BP: (!) 144/70  Pulse: 60  Resp: 18  Temp: (!) 96.7 F (35.9 C)  TempSrc: Temporal  SpO2: 96%  PainSc: 0-No pain    LUNGS: decreased breath sounds CARDIAC: Cor RRR  EXTREMITIES: none edema SKIN: bruising to left  upper arm  NEURO: positive for memory problems       Amanda Simmer, RN

## 2019-05-24 ENCOUNTER — Telehealth: Payer: Self-pay

## 2019-05-24 NOTE — Telephone Encounter (Signed)
Telephone call to patient to schedule palliative care visit with patient. Patient/family in agreement with home visit on 05-25-19 at 2:00PM.

## 2019-05-25 ENCOUNTER — Other Ambulatory Visit: Payer: PPO

## 2019-05-25 ENCOUNTER — Other Ambulatory Visit: Payer: Self-pay

## 2019-05-25 DIAGNOSIS — Z515 Encounter for palliative care: Secondary | ICD-10-CM

## 2019-05-25 NOTE — Progress Notes (Signed)
COMMUNITY PALLIATIVE CARE SW NOTE  PATIENT NAME: Amanda Hancock DOB: 07/31/1945 MRN: [id number]  PRIMARY CARE PROVIDER: Clark, Katherine K, NP  RESPONSIBLE PARTY:  Acct ID - Guarantor Home Phone Work Phone Relationship Acct Type  [id number] - Amanda Hancock* [phone]  Self P/F     [address], [place], [place] [address]     PLAN OF CARE and INTERVENTIONS:             1. GOALS OF CARE/ ADVANCE CARE PLANNING:  Patient is a FULL CODE. Goal is for patient to be cared for at home. 2. SOCIAL/EMOTIONAL/SPIRITUAL ASSESSMENT/ INTERVENTIONS:  SW and RN met with patient and Amanda Hancock (patient'shusband)in the home. Amanda Hancock (patient's daughter) joined visit. Patient denies pain. Patient is sleeping well, other than waking to change her brief. Patient has a good appetite. Patient enjoys spending time with her great grandchild and coloring pictures. 3. PATIENT/CAREGIVER EDUCATION/ COPING:  Patient was alert, oriented to self and place. Patient does have periods of anxiety and tearfulness. Amanda Hancock feels patient is crying more often. SW and family discussed patient's cognitive decline and challenges. SW provided education on disease and progression. SW and family had long discussion regarding care concerns and need for additional support in the home. SW has ongoing concerns of care management, to discuss with supervisor. Discussed safety and fall precautions. 4. PERSONAL EMERGENCY PLAN:  Family will call 9-1-1 for emergencies. 5. COMMUNITY RESOURCES COORDINATION/ HEALTH CARE NAVIGATION:  Amanda Hancock coordinates care. Amanda Hancock is primary caregiver in the home. Amanda Hancock has a neighbor that comes to sit with patient so he can run errands but there is no set schedule. Amanda Hancock is going to look into local day program option versus continuing to hire a private aide.  6. FINANCIAL/LEGAL CONCERNS/INTERVENTIONS:  Per Amanda Hancock, patient does not qualify for Medicaid. Limited finances to support care. Amanda Hancock requested referral to  [employer], another local meal program option.     SOCIAL HX:  Social History   Tobacco Use  . Smoking status: Never Smoker  . Smokeless tobacco: Never Used  Substance Use Topics  . Alcohol use: No    CODE STATUS:   Code Status: Not on file (FULL CODE) ADVANCED DIRECTIVES: N MOST FORM COMPLETE:  No. HOSPICE EDUCATION PROVIDED: None.  PPS: Patient needs standby assist for safety with ADLs. Patient is encouraged to ambulate with a cane, but does forget.   I spent45minutes with patient/family, from12:00-12:45pproviding education, support and consultation.   , LCSW 

## 2019-05-27 ENCOUNTER — Telehealth: Payer: Self-pay
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
## 2019-06-03 ENCOUNTER — Ambulatory Visit (INDEPENDENT_AMBULATORY_CARE_PROVIDER_SITE_OTHER): Payer: PPO | Admitting: Primary Care

## 2019-06-03 ENCOUNTER — Other Ambulatory Visit: Payer: Self-pay

## 2019-06-03 ENCOUNTER — Encounter: Payer: Self-pay | Admitting: Primary Care

## 2019-06-03 VITALS — BP 134/80 | HR 64 | Temp 96.2°F | Ht 60.0 in | Wt 183.5 lb

## 2019-06-03 DIAGNOSIS — R350 Frequency of micturition: Secondary | ICD-10-CM

## 2019-06-03 DIAGNOSIS — R300 Dysuria: Secondary | ICD-10-CM | POA: Diagnosis not present

## 2019-06-03 LAB — POC URINALSYSI DIPSTICK (AUTOMATED)
Bilirubin, UA: NEGATIVE
Glucose, UA: NEGATIVE
Ketones, UA: NEGATIVE
Leukocytes, UA: NEGATIVE
Nitrite, UA: NEGATIVE
Protein, UA: NEGATIVE
Spec Grav, UA: 1.02 (ref 1.010–1.025)
Urobilinogen, UA: 0.2 E.U./dL
pH, UA: 6.5 (ref 5.0–8.0)

## 2019-06-03 MED ORDER — SULFAMETHOXAZOLE-TRIMETHOPRIM 800-160 MG PO TABS: 1.0000 | ORAL_TABLET | Freq: Two times a day (BID) | ORAL | 0 refills | Status: DC

## 2019-06-03 NOTE — Progress Notes (Signed)
Subjective:    Patient ID: Amanda Hancock, female    DOB: Jul 07, 1945, 74 y.o.   MRN: [id number]  HPI  This visit occurred during the SARS-CoV-2 public health emergency.  Safety protocols were in place, including screening questions prior to the visit, additional usage of staff PPE, and extensive cleaning of exam room while observing appropriate contact time as indicated for disinfecting solutions.    Amanda Hancock is a 74 year old female with a history of Azlheimer's, hypertension, MDD who presents today with a chief complaint of urinary frequency. Her husband is providing the information for HPI due to her Alzheimer's.  She will go to urinate 3-4 times an hour but with little urine output. Her husband noticed blood in her urine 2-3 weeks ago, has not seen any blood since. Her husband denies acute mental changes, fevers, foul smelling urine, vaginal discharge, dysuria.   Her husband has been giving her Cranberry juice, no improvement in symptoms.   Review of Systems  Constitutional: Negative for fever.  Gastrointestinal: Positive for constipation. Negative for blood in stool.  Genitourinary: Positive for frequency and urgency. Negative for dysuria, pelvic pain and vaginal discharge.  Neurological:       Husband denies acute confusion       Past Medical History:  Diagnosis Date  . Abnormal Pap smear    ASCUS  . Anxiety   . Chronic kidney disease    KIDNEYSTONES  . Depression   . Elevated blood pressure (not hypertension)   . Hypertension   . Vaginal Pap smear with ASC-US and positive HPV 12/20/2010  . Vaginal Pap smear, abnormal      Social History   Socioeconomic History  . Marital status: Married    Spouse name: Not on file  . Number of children: Not on file  . Years of education: Not on file  . Highest education level: Not on file  Occupational History  . Not on file  Tobacco Use  . Smoking status: Never Smoker  . Smokeless tobacco: Never Used  Substance and Sexual  Activity  . Alcohol use: No  . Drug use: No  . Sexual activity: Not Currently  Other Topics Concern  . Not on file  Social History Narrative   Married.   2 children, 3 grandchildren, 1 grandchild.   Does not have a living will.   Would desire CPR and life support if not futile.   Social Determinants of Health   Financial Resource Strain:   . Difficulty of Paying Living Expenses:   Food Insecurity:   . Worried About Programme researcher, broadcasting/film/video in the Last Year:   . Barista in the Last Year:   Transportation Needs:   . Freight forwarder (Medical):   Marland Kitchen Lack of Transportation (Non-Medical):   Physical Activity:   . Days of Exercise per Week:   . Minutes of Exercise per Session:   Stress:   . Feeling of Stress :   Social Connections:   . Frequency of Communication with Friends and Family:   . Frequency of Social Gatherings with Friends and Family:   . Attends Religious Services:   . Active Member of Clubs or Organizations:   . Attends Banker Meetings:   Marland Kitchen Marital Status:   Intimate Partner Violence:   . Fear of Current or Ex-Partner:   . Emotionally Abused:   Marland Kitchen Physically Abused:   . Sexually Abused:     Past Surgical History:  Procedure Laterality Date  . ABDOMINAL HYSTERECTOMY    . TUBAL LIGATION      Family History  Problem Relation Age of Onset  . Parkinsonism Mother   . Dementia Mother   . Heart attack Paternal Grandfather   . Diabetes Maternal Grandmother   . Heart attack Father   . Dementia Cousin   . Breast cancer Maternal Aunt   . Breast cancer Paternal Aunt     Allergies  Allergen Reactions  . Codeine Nausea And Vomiting and Other (See Comments)    headache  . Ace Inhibitors Cough  . Hctz [Hydrochlorothiazide]     Rash     Current Outpatient Medications on File Prior to Visit  Medication Sig Dispense Refill  . amLODipine (NORVASC) 10 MG tablet Take 1 tablet by mouth once daily for blood pressure 90 tablet 1  . aspirin 81  MG tablet Take 81 mg by mouth daily.    . Cyanocobalamin (B-12 PO) Take 2,000 mcg by mouth daily.    . Cyanocobalamin (B-12) 2000 MCG TABS Take by mouth.    . losartan (COZAAR) 50 MG tablet Take 1 tablet by mouth once daily for blood pressure 90 tablet 2  . MELATONIN PO Take by mouth at bedtime as needed.    . Multiple Vitamins-Minerals (MULTIVITAMIN ADULT PO) Take by mouth.    . QUEtiapine (SEROQUEL) 100 MG tablet Take 200 mg by mouth at bedtime.    . sertraline (ZOLOFT) 100 MG tablet TAKE 1 TABLET BY MOUTH ONCE DAILY (Patient taking differently: Take 100 mg by mouth at bedtime. ) 90 tablet 0   No current facility-administered medications on file prior to visit.    BP 134/80   Pulse 64   Temp (!) 96.2 F (35.7 C) (Temporal)   Ht 5' (1.524 m)   Wt 183 lb 8 oz (83.2 kg)   SpO2 97%   BMI 35.84 kg/m    Objective:   Physical Exam  Constitutional: She appears well-nourished.  Cardiovascular: Normal rate and regular rhythm.  Respiratory: Effort normal and breath sounds normal.  GI: There is no CVA tenderness.  Neurological: She is alert.  Skin: Skin is warm and dry.           Assessment & Plan:

## 2019-06-03 NOTE — Patient Instructions (Signed)
Start Bactrim DS (sulfamethoxazole/trimethoprim) tablets for urinary tract infection. Take 1 tablet by mouth twice daily for 5 days.  Be sure to drink plenty of water during the day.  Please update me if no improvement by early next week.  It was a pleasure to see you today!

## 2019-06-03 NOTE — Assessment & Plan Note (Signed)
Acute for 2-3 weeks, no improvement. UA today negative for leuks and nitrites, 3+ blood. Culture sent.  Prior UA without evidence of blood. Given symptoms coupled with 3+ blood we will treat for presumed acute cystitis.  Rx for Bactrim DS course sent to pharmacy.  She is not in distress, low suspicion for renal stone.

## 2019-06-04 LAB — URINE CULTURE
MICRO NUMBER:: 10266389
Result:: NO GROWTH
SPECIMEN QUALITY:: ADEQUATE

## 2019-06-08 ENCOUNTER — Other Ambulatory Visit: Payer: Self-pay | Admitting: Primary Care

## 2019-06-08 DIAGNOSIS — R3129 Other microscopic hematuria: Secondary | ICD-10-CM

## 2019-06-15 ENCOUNTER — Other Ambulatory Visit (INDEPENDENT_AMBULATORY_CARE_PROVIDER_SITE_OTHER): Payer: PPO

## 2019-06-15 ENCOUNTER — Other Ambulatory Visit: Payer: Self-pay

## 2019-06-15 DIAGNOSIS — R3129 Other microscopic hematuria: Secondary | ICD-10-CM

## 2019-06-15 LAB — POC URINALSYSI DIPSTICK (AUTOMATED)
Bilirubin, UA: NEGATIVE
Glucose, UA: NEGATIVE
Ketones, UA: NEGATIVE
Nitrite, UA: NEGATIVE
Protein, UA: POSITIVE — AB
Spec Grav, UA: 1.02 (ref 1.010–1.025)
Urobilinogen, UA: 0.2 E.U./dL
pH, UA: 6 (ref 5.0–8.0)

## 2019-06-16 ENCOUNTER — Telehealth: Payer: Self-pay

## 2019-06-16 NOTE — Telephone Encounter (Signed)
Telephone call to patient to schedule palliative care visit with patient. Patient/family in agreement with home visit on 06-18-19 at 1:00PM.

## 2019-06-17 LAB — URINE CULTURE
MICRO NUMBER:: 10307684
Result:: NO GROWTH
SPECIMEN QUALITY:: ADEQUATE

## 2019-06-18 ENCOUNTER — Other Ambulatory Visit: Payer: PPO

## 2019-06-18 ENCOUNTER — Other Ambulatory Visit: Payer: Self-pay

## 2019-06-21 ENCOUNTER — Other Ambulatory Visit: Payer: PPO

## 2019-06-21 ENCOUNTER — Other Ambulatory Visit: Payer: Self-pay

## 2019-06-24 ENCOUNTER — Other Ambulatory Visit: Payer: PPO

## 2019-06-24 ENCOUNTER — Other Ambulatory Visit: Payer: Self-pay

## 2019-06-24 DIAGNOSIS — Z515 Encounter for palliative care: Secondary | ICD-10-CM

## 2019-06-24 NOTE — Progress Notes (Signed)
COMMUNITY PALLIATIVE CARE SW NOTE  PATIENT NAME: Amanda Hancock DOB: 02/06/1946 MRN: [id number]  PRIMARY CARE PROVIDER: Clark, Katherine K, NP  RESPONSIBLE PARTY:  Acct ID - Guarantor Home Phone Work Phone Relationship Acct Type  [id number] - Wigal,[name] [phone]  Self P/F     [address], [place], [place] [address]     PLAN OF CARE and INTERVENTIONS:             1. GOALS OF CARE/ ADVANCE CARE PLANNING:  Patient is a FULL CODE. Goal is for patient tobe cared forat home. 2. SOCIAL/EMOTIONAL/SPIRITUAL ASSESSMENT/ INTERVENTIONS:  SW and SW supervisor met with patient and Bud (patient's husband) in the home. Bud provided brief update. Patient is doing "about the same". Patient reports pain in her left shoulder/chest area that comes and goes. Patient reports that she feels a "knot" in this area. Patient said she has told her daughter. SW encouraged patient to report if this pain increases. Patient said ice does help. Patient is sleeping well. Patient has a great appetite. Patient saw PCP on 3/18. No medication changes or concerns. SW and Bud discussed patient's cognitive changes and challenges. SW provided education on disease progression. Offered to bring instructional education videos, Bud declined and said he "does not have time". SW has ongoing concerns of care management long-term. SW discussed safety and fall precautions, reviewed care plan, discussed resources and provided emotional support. 3. PATIENT/CAREGIVER EDUCATION/ COPING:  Patient was alert, oriented to self and place. Patient does have periods of anxiety and states "I try to calm myself". Bud noted increased hallucinations. Patient denies that these hallucinations are scary. Bud said it is usually the television that causes it, but patient does occasionally report that she is seeing people in the home. Encouraged to follow-up with neurologist. Family is having more conversations about care, and seem to be coming to help more often.   4. PERSONAL EMERGENCY PLAN:  Family will call 9-1-1 for emergencies. 5. COMMUNITY RESOURCES COORDINATION/ HEALTH CARE NAVIGATION:  Christina coordinates care. Bud remains the primary caregiver in the home. Bud said that family is more aware of the situation and starting to help more often. Still no set schedule but last week, daughter-in-law took patient out for a few hours and Bud's sister come and sit with patient for a few hours. Christina also helps daily. SW encouraged maintaining a routine and schedule, if possible. Continuing to discuss options for local day program option versus hiring private care. No upcoming appointments. 6. FINANCIAL/LEGAL CONCERNS/INTERVENTIONS:  Per family, patient does not qualify for Medicaid. Limited finances to support care, ongoing discussion.     SOCIAL HX:  Social History   Tobacco Use  . Smoking status: Never Smoker  . Smokeless tobacco: Never Used  Substance Use Topics  . Alcohol use: No    CODE STATUS: Full Code ADVANCED DIRECTIVES: N MOST FORM COMPLETE:  No. HOSPICE EDUCATION PROVIDED: None.  PPS: Patient needs standby assistwith all ADLs. Patient is encouraged to ambulate with a cane.   I spent45minutes with patient/family, from12:00-12:45pproviding education, support and consultation.   , LCSW 

## 2019-07-02 ENCOUNTER — Telehealth: Payer: Self-pay

## 2019-07-05 NOTE — Telephone Encounter (Signed)
SW contacted Neysa Bonito (patient's daughter) to discuss local day program resource and pricing - [facility]. SW provided contact information for [facility] to follow-up. Advised that day program is not open but is hopeful to open in May.

## 2019-07-23 ENCOUNTER — Telehealth: Payer: Self-pay

## 2019-07-23 NOTE — Telephone Encounter (Signed)
Telephone call to schedule palliative care visit.  Daughter in agreement with palliative care team making home visit 07/28/19 at 9:30 AM.

## 2019-07-25 ENCOUNTER — Encounter: Payer: Self-pay | Admitting: Emergency Medicine

## 2019-07-25 ENCOUNTER — Emergency Department
Admission: EM | Admit: 2019-07-25 | Discharge: 2019-07-25 | Disposition: A | Discharge status: Home / Self Care | Payer: PPO | Attending: Emergency Medicine | Admitting: Emergency Medicine

## 2019-07-25 ENCOUNTER — Emergency Department: Payer: PPO

## 2019-07-25 ENCOUNTER — Other Ambulatory Visit: Payer: Self-pay

## 2019-07-25 DIAGNOSIS — S199XXA Unspecified injury of neck, initial encounter: Secondary | ICD-10-CM | POA: Diagnosis not present

## 2019-07-25 DIAGNOSIS — Y999 Unspecified external cause status: Secondary | ICD-10-CM | POA: Diagnosis not present

## 2019-07-25 DIAGNOSIS — Y92099 Unspecified place in other non-institutional residence as the place of occurrence of the external cause: Secondary | ICD-10-CM | POA: Insufficient documentation

## 2019-07-25 DIAGNOSIS — Z23 Encounter for immunization: Secondary | ICD-10-CM | POA: Diagnosis not present

## 2019-07-25 DIAGNOSIS — W06XXXA Fall from bed, initial encounter: Secondary | ICD-10-CM | POA: Diagnosis not present

## 2019-07-25 DIAGNOSIS — Y9389 Activity, other specified: Secondary | ICD-10-CM | POA: Insufficient documentation

## 2019-07-25 DIAGNOSIS — R519 Headache, unspecified: Secondary | ICD-10-CM | POA: Insufficient documentation

## 2019-07-25 DIAGNOSIS — S4991XA Unspecified injury of right shoulder and upper arm, initial encounter: Secondary | ICD-10-CM | POA: Diagnosis not present

## 2019-07-25 DIAGNOSIS — S0181XA Laceration without foreign body of other part of head, initial encounter: Secondary | ICD-10-CM | POA: Diagnosis not present

## 2019-07-25 DIAGNOSIS — G309 Alzheimer's disease, unspecified: Secondary | ICD-10-CM | POA: Diagnosis not present

## 2019-07-25 DIAGNOSIS — S01112A Laceration without foreign body of left eyelid and periocular area, initial encounter: Secondary | ICD-10-CM | POA: Insufficient documentation

## 2019-07-25 DIAGNOSIS — S0083XA Contusion of other part of head, initial encounter: Secondary | ICD-10-CM

## 2019-07-25 DIAGNOSIS — W19XXXA Unspecified fall, initial encounter: Secondary | ICD-10-CM

## 2019-07-25 DIAGNOSIS — S0990XA Unspecified injury of head, initial encounter: Secondary | ICD-10-CM

## 2019-07-25 DIAGNOSIS — S40011A Contusion of right shoulder, initial encounter: Secondary | ICD-10-CM | POA: Insufficient documentation

## 2019-07-25 DIAGNOSIS — M25511 Pain in right shoulder: Secondary | ICD-10-CM | POA: Diagnosis not present

## 2019-07-25 MED ORDER — TETANUS-DIPHTH-ACELL PERTUSSIS 5-2.5-18.5 LF-MCG/0.5 IM SUSP
0.5000 mL | Freq: Once | INTRAMUSCULAR | Status: AC
  Administered 2019-07-25: 0.5 mL via INTRAMUSCULAR
  Filled 2019-07-25: qty 0.5

## 2019-07-25 MED ORDER — CEPHALEXIN 500 MG PO CAPS: 1000.0000 mg | ORAL_CAPSULE | Freq: Two times a day (BID) | ORAL | 0 refills | Status: DC

## 2019-07-25 MED ORDER — LIDOCAINE-EPINEPHRINE (PF) 2 %-1:200000 IJ SOLN
10.0000 mL | Freq: Once | INTRAMUSCULAR | Status: AC
  Administered 2019-07-25: 10 mL
  Filled 2019-07-25: qty 10

## 2019-07-25 NOTE — ED Notes (Signed)
Laceration to left forehead noted, no active bleeding at this time. Pt is Demented at baseline per daughter.

## 2019-07-25 NOTE — ED Notes (Addendum)
First RN note: pt presents to ED via POV with daughter, pt's daughter reports patient fell out of bed earlier today and hit her head. Pt with laceration to L front forehead at this time.

## 2019-07-25 NOTE — ED Triage Notes (Signed)
PT to ER states she rolled off the bed while asleep and hit her head on the chair next to the bed when she fell.  Pt has hx of dementia, but was acting normally per daughter.  PT does not take any blood thinners.  PT also c/o right shoulder pain.

## 2019-07-25 NOTE — ED Provider Notes (Signed)
[facility] Emergency Department Provider Note  ____________________________________________  Time seen: Approximately 3:17 PM  I have reviewed the triage vital signs and the nursing notes.   HISTORY  Chief Complaint Fall  Patient with a history of dementia, daughter provides most of the details.  HPI Amanda Hancock is a 74 y.o. female who presents the emergency department with her daughter for complaint of a fall from bed.  Patient on of the bed striking her head on a bedside chair.  The daughter heard the fall, went to the patient's room and found her.  She immediately saw a laceration to the left forehead.  She immediately applied pressure over the the wound.  Patient has been acting at her baseline.  She is confused but this is patient's baseline.  Patient has a history of dementia.  Unsure last tetanus shot.  Patient was complaining of pain under the laceration and right shoulder pain.  No other complaints at this time.  Per the daughter the patient has been acting her normal self, is at her normal mental status, has only been complaining of these 2 locations for pain since the time of injury.         Past Medical History:  Diagnosis Date  . Abnormal Pap smear    ASCUS  . Anxiety   . Chronic kidney disease    KIDNEYSTONES  . Depression   . Elevated blood pressure (not hypertension)   . Hypertension   . Vaginal Pap smear with ASC-US and positive HPV 12/20/2010  . Vaginal Pap smear, abnormal     Patient Active Problem List   Diagnosis Date Noted  . Urinary frequency 06/03/2019  . 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 Preventative health care 10/11/2016  . Chest pain 11/03/2013  . Hypercalcemia 10/09/2013  . Vitamin D deficiency 10/09/2013  . Vaginal Pap smear with LGSIL, positive HRHPV Type 16 03/16/2013  . HTN (hypertension) 01/28/2011    Past Surgical  History:  Procedure Laterality Date  . ABDOMINAL HYSTERECTOMY    . TUBAL LIGATION      Prior to Admission medications   Medication Sig Start Date End Date Taking? Authorizing Provider  amLODipine (NORVASC) 10 MG tablet Take 1 tablet by mouth once daily for blood pressure 03/22/19   Pleas Koch, NP  aspirin 81 MG tablet Take 81 mg by mouth daily.    [provider]  Cyanocobalamin (B-12 PO) Take 2,000 mcg by mouth daily.    [provider]  Cyanocobalamin (B-12) 2000 MCG TABS Take by mouth.    [provider]  losartan (COZAAR) 50 MG tablet Take 1 tablet by mouth once daily for blood pressure 12/15/18   Pleas Koch, NP  MELATONIN PO Take by mouth at bedtime as needed.    [provider]  Multiple Vitamins-Minerals (MULTIVITAMIN ADULT PO) Take by mouth.    [provider]  QUEtiapine (SEROQUEL) 100 MG tablet Take 200 mg by mouth at bedtime.    [provider]  sertraline (ZOLOFT) 100 MG tablet TAKE 1 TABLET BY MOUTH ONCE DAILY Patient taking differently: Take 100 mg by mouth at bedtime.  06/25/16   Lucille Passy, MD  sulfamethoxazole-trimethoprim (BACTRIM DS) 800-160 MG tablet Take 1 tablet by mouth 2 (two) times daily. For urinary tract infection. 06/03/19   Pleas Koch, NP    Allergies Codeine, Ace inhibitors, and Hctz [hydrochlorothiazide]  Family History  Problem Relation Age of Onset  . Parkinsonism Mother   . Dementia Mother   . Heart attack Paternal Grandfather   . Diabetes Maternal Grandmother   . Heart attack Father   . Dementia Cousin   . Breast cancer Maternal Aunt   . Breast cancer Paternal Aunt     Social History Social History   Tobacco Use  . Smoking status: Never Smoker  . Smokeless tobacco: Never Used  Substance Use Topics  . Alcohol use: No  . Drug use: No     Review of Systems provided by daughter Constitutional: No fever/chills Eyes: No visual changes. No discharge ENT: No upper  respiratory complaints. Cardiovascular: no chest pain. Respiratory: no cough. No SOB. Gastrointestinal: No abdominal pain.  No nausea, no vomiting.  No diarrhea.  No constipation. Genitourinary: Negative for dysuria. No hematuria Musculoskeletal: Positive for right shoulder pain Skin: Positive for left forehead laceration Neurological: Negative for headaches, focal weakness or numbness. 10-point ROS otherwise negative.  ____________________________________________   PHYSICAL EXAM:  VITAL SIGNS: ED Triage Vitals  Enc Vitals Group     BP 07/25/19 1111 (!) 156/69     Pulse Rate 07/25/19 1111 61     Resp 07/25/19 1111 20     Temp 07/25/19 1111 98.2 F (36.8 C)     Temp Source 07/25/19 1111 Oral     SpO2 07/25/19 1111 97 %     Weight 07/25/19 1112 180 lb (81.6 kg)     Height 07/25/19 1112 5' (1.524 m)     Head Circumference --      Peak Flow --      Pain Score --      Pain Loc --      Pain Edu? --      Excl. in GC? --      Constitutional: Alert and oriented. Well appearing and in no acute distress. Eyes: Conjunctivae are normal. PERRL. EOMI. Head: Patient with appreciated laceration to the left forehead.  Initial evaluation reveals significant amount of scabbing, caked blood.  Unable to fully appreciate laceration.  After cleaning, this was a ragged deep laceration but does not penetrate into the facial muscles.  Easily approximated.  This laceration measures approximately 5 cm.  No foreign body.  Area of ecchymosis extends from under laceration into the left orbital region.  No tenderness to palpation over the osseous structures of the face.  Patient has tenderness over the left frontal skull but no other tenderness to palpation over the osseous structures of the skull.  No battle signs.  Patient has ecchymosis around the left thigh but none around the right.  No serosanguineous fluid drainage from ears or nares. ENT:      Ears:       Nose: No congestion/rhinnorhea.       Mouth/Throat: Mucous membranes are moist.  Neck: No stridor.  No appreciable tenderness to palpation.  No palpable abnormality.  Full range of motion to the cervical spine.  Cardiovascular: Normal rate, regular rhythm. Normal S1 and S2.  Good peripheral circulation. Respiratory: Normal respiratory effort without tachypnea or retractions. Lungs CTAB. Good air entry to the bases with no decreased or absent breath sounds. Gastrointestinal: Bowel sounds 4 quadrants. Soft and nontender to palpation. No guarding or rigidity. No palpable masses. No distention. No CVA tenderness. Musculoskeletal: Full range of motion to all extremities. No gross deformities appreciated.  Visualization of the right shoulder reveals no deformity.  Patient is tender to palpation over the lateral aspect  of the shoulder on palpation but no palpable abnormality.  Good range of motion.  Examination of the elbow and wrist is unremarkable.  Radial pulse and sensation intact bilateral upper extremities. Neurologic:  Normal speech and language. No gross focal neurologic deficits are appreciated.  Skin:  Skin is warm, dry and intact. No rash noted. Psychiatric: Patient with Alzheimer's.  At baseline per the daughter.  Mood and affect are normal. Speech and behavior are normal. Patient exhibits limited insight and judgement.   ____________________________________________   LABS (all labs ordered are listed, but only abnormal results are displayed)  Labs Reviewed - No data to display ____________________________________________  EKG   ____________________________________________  RADIOLOGY I personally viewed and evaluated these images as part of my medical decision making, as well as reviewing the written report by the radiologist.  DG Shoulder Right  Result Date: 07/25/2019 CLINICAL DATA:  Right anterior shoulder pain status post fall. EXAM: RIGHT SHOULDER - 2+ VIEW COMPARISON:  None. FINDINGS: There is no evidence of  fracture or dislocation. 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 Soft tissues are unremarkable. IMPRESSION: 1. No acute fracture or dislocation identified about the right shoulder. 2. 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 Electronically Signed   By: Ted Mcalpine M.D.   On: 07/25/2019 12:08   CT Head Wo Contrast  Result Date: 07/25/2019 CLINICAL DATA:  Head and neck trauma after a fall from bed. EXAM: CT HEAD WITHOUT CONTRAST CT CERVICAL SPINE WITHOUT CONTRAST TECHNIQUE: Multidetector CT imaging of the head and cervical spine was performed following the standard protocol without intravenous contrast. Multiplanar CT image reconstructions of the cervical spine were also generated. COMPARISON:  CT head dated 12/05/2013 and MR brain dated 01/05/2018. FINDINGS: CT HEAD FINDINGS Brain: No evidence of acute infarction, hemorrhage, hydrocephalus, extra-axial collection or mass lesion/mass effect. There is moderate cerebral volume loss with associated ex vacuo dilatation. Periventricular white matter hypoattenuation likely represents chronic small vessel ischemic disease. Vascular: There are vascular calcifications in the carotid siphons. Skull: Normal. Negative for fracture or focal lesion. Sinuses/Orbits: No acute finding. Other: There is mild soft tissue swelling overlying the left forehead/periorbital region. CT CERVICAL SPINE FINDINGS Alignment: Gentle cervical kyphosis is likely positional. Skull base and vertebrae: No acute fracture. No primary bone lesion or focal pathologic process. Soft tissues and spinal canal: No prevertebral fluid or swelling. No visible canal hematoma. Disc levels:  Multilevel degenerative disc and joint disease. Upper chest: Negative. Other: Bilateral thyroid nodules measure 3.3 cm on the left and 1.6 cm on the right. IMPRESSION: 1. No acute intracranial process. 2. No acute osseous injury in the cervical spine.  3. Bilateral thyroid nodules measure 3.3 cm on the left and 1.6 cm on the right. Recommend non emergent thyroid US (ref: J Am Coll Radiol. 2015 Feb;12(2): 143-50). Electronically Signed   By: Romona Curls M.D.   On: 07/25/2019 12:03   CT Cervical Spine Wo Contrast  Result Date: 07/25/2019 CLINICAL DATA:  Head and neck trauma after a fall from bed. EXAM: CT HEAD WITHOUT CONTRAST CT CERVICAL SPINE WITHOUT CONTRAST TECHNIQUE: Multidetector CT imaging of the head and cervical spine was performed following the standard protocol without intravenous contrast. Multiplanar CT image reconstructions of the cervical spine were also generated. COMPARISON:  CT head dated 12/05/2013 and MR brain dated 01/05/2018. FINDINGS: CT HEAD FINDINGS Brain: No evidence of acute infarction, hemorrhage, hydrocephalus, extra-axial collection or mass lesion/mass effect. There is moderate cerebral volume loss with associated ex  vacuo dilatation. Periventricular white matter hypoattenuation likely represents chronic small vessel ischemic disease. Vascular: There are vascular calcifications in the carotid siphons. Skull: Normal. Negative for fracture or focal lesion. Sinuses/Orbits: No acute finding. Other: There is mild soft tissue swelling overlying the left forehead/periorbital region. CT CERVICAL SPINE FINDINGS Alignment: Gentle cervical kyphosis is likely positional. Skull base and vertebrae: No acute fracture. No primary bone lesion or focal pathologic process. Soft tissues and spinal canal: No prevertebral fluid or swelling. No visible canal hematoma. Disc levels:  Multilevel degenerative disc and joint disease. Upper chest: Negative. Other: Bilateral thyroid nodules measure 3.3 cm on the left and 1.6 cm on the right. IMPRESSION: 1. No acute intracranial process. 2. No acute osseous injury in the cervical spine. 3. Bilateral thyroid nodules measure 3.3 cm on the left and 1.6 cm on the right. Recommend non emergent thyroid US (ref: J Am  Coll Radiol. 2015 Feb;12(2): 143-50). Electronically Signed   By: Romona Curls M.D.   On: 07/25/2019 12:03    ____________________________________________    PROCEDURES  Procedure(s) performed:    Marland KitchenMarland KitchenLaceration Repair  Date/Time: 07/25/2019 4:04 PM Performed by: Racheal Patches, PA-C Authorized by: Racheal Patches, PA-C   Consent:    Consent obtained:  Verbal   Consent given by:  Patient and guardian   Risks discussed:  Pain, poor cosmetic result, poor wound healing and infection Anesthesia (see MAR for exact dosages):    Anesthesia method:  Local infiltration   Local anesthetic:  Lidocaine 2% WITH epi Laceration details:    Location:  Face   Face location:  Forehead   Length (cm):  5 Repair type:    Repair type:  Simple Pre-procedure details:    Preparation:  Patient was prepped and draped in usual sterile fashion Exploration:    Hemostasis achieved with:  Direct pressure and epinephrine   Wound extent: no foreign bodies/material noted, no muscle damage noted, no nerve damage noted, no tendon damage noted and no vascular damage noted     Contaminated: no   Treatment:    Area cleansed with:  Betadine and saline   Amount of cleaning:  Standard   Irrigation solution:  Sterile saline   Irrigation volume:  500 ml Skin repair:    Repair method:  Sutures   Suture size:  5-0   Suture material:  Nylon   Suture technique:  Running locked   Number of sutures:  1 Approximation:    Approximation:  Close Post-procedure details:    Dressing:  Open (no dressing)   Patient tolerance of procedure:  Tolerated well, no immediate complications      Medications  lidocaine-EPINEPHrine (XYLOCAINE W/EPI) 2 %-1:200000 (PF) injection 10 mL (has no administration in time range)  Tdap (BOOSTRIX) injection 0.5 mL (has no administration in time range)     ____________________________________________   INITIAL IMPRESSION / ASSESSMENT AND PLAN / ED COURSE  Pertinent  labs & imaging results that were available during my care of the patient were reviewed by me and considered in my medical decision making (see chart for details).  Review of the  [place]RS was performed in accordance of the [facility] prior to dispensing any controlled drugs.           Patient's diagnosis is consistent with fall, facial contusion, forehead laceration.  Patient presented to emergency department with her daughter after sustaining a fall while rolling out of bed.  Patient struck a bedside table and sustained a laceration along the left forehead.  Imaging is reassuring with no acute traumatic findings.  Laceration is closed as described above.  Wound care instructions discussed with the patient and her daughter.  Differential included skull fracture, intracranial hemorrhage, facial contusion, laceration of the face, shoulder dislocation, shoulder fracture.  Patient replaced on antibiotics prophylactically.  Follow-up with primary care in 1 week for suture removal.. Patient will be discharged home with prescriptions for antibiotics prophylactically. Patient is to follow up with primary care as listed above as needed or otherwise directed. Patient is given ED precautions to return to the ED for any worsening or new symptoms.     ____________________________________________  FINAL CLINICAL IMPRESSION(S) / ED DIAGNOSES  Final diagnoses:  Fall, initial encounter  Injury of head, initial encounter  Laceration of eyebrow and forehead, left, initial encounter  Contusion of face, initial encounter  Contusion of right shoulder, initial encounter      NEW MEDICATIONS STARTED DURING THIS VISIT:  ED Discharge Orders    None          This chart was dictated using voice recognition software/Dragon. Despite best efforts to proofread, errors can occur which can change the meaning. Any change was purely unintentional.    Racheal Patches, PA-C 07/25/19 1609    Minna Antis,  MD 07/25/19 [phone]

## 2019-07-28 ENCOUNTER — Other Ambulatory Visit: Payer: PPO

## 2019-07-28 ENCOUNTER — Other Ambulatory Visit: Payer: Self-pay

## 2019-07-28 DIAGNOSIS — Z515 Encounter for palliative care: Secondary | ICD-10-CM

## 2019-07-28 NOTE — Progress Notes (Signed)
COMMUNITY PALLIATIVE CARE SW NOTE  PATIENT NAME: Amanda Hancock DOB: January 11, 1946 MRN: [id number]  PRIMARY CARE PROVIDER: Pleas Koch, NP  RESPONSIBLE PARTY:  Acct ID - Guarantor Home Phone Work Phone Relationship Acct Type  [id number] [name]  Self P/F     [address], [place], [place] [address]     PLAN OF CARE and INTERVENTIONS:             1. GOALS OF CARE/ ADVANCE CARE PLANNING:  Patient is a FULL CODE. No advanced care directives in place. Goal is for patient tobe cared forat home. 2. SOCIAL/EMOTIONAL/SPIRITUAL ASSESSMENT/ INTERVENTIONS:  SW and RN met with patient and Bud (patient's husband) in the home. Discussed patient's ED visit on 5/9 following a fall at her daughter's house. Patient rolled out of the bed and hit her face. Patient had to have sutures placed above her right eye and is bruised around that eye. Denies pain but is applying ice. Patient did have another fall yesterday in the living room of her home, but reports no injuries. Patient reports chronic pain in her left leg. Bud said patient takes tylenol for pain, as needed. Patient has a good appetite per Bud. Patient is sleeping well at night. Patient will nap during the day. Patient enjoys being outside and spending time with family. SW reviewed safety and fall precautions, discussed plan of care, and provided supportive counseling. 3. PATIENT/CAREGIVER EDUCATION/ COPING:  Patient was alert, oriented to self and place. Patient continues to have anxiety, but is more calm overall. Bud said she does have hallucinations, sees people that are not there but is not fearful. Patient and Bud's relationship seems to be better. Bud assisted patient to the bathroom during visit and was attentive and supportive. Bud said he recognizes when he needs breaks and talks with family. Family continues to discuss plan for long-term care. 4. PERSONAL EMERGENCY PLAN:  Family will call 9-1-1 for emergencies. 5. COMMUNITY  RESOURCES COORDINATION/ HEALTH CARE NAVIGATION:  Christina coordinates care. Bud is the primary caregiver but Margreta Journey is supportive and lives across the street. Discussing options for day program and in-home care. No upcoming appointments, but will need to schedule with PCP for suture removal. 6. FINANCIAL/LEGAL CONCERNS/INTERVENTIONS:  None.     SOCIAL HX:  Social History   Tobacco Use  . Smoking status: Never Smoker  . Smokeless tobacco: Never Used  Substance Use Topics  . Alcohol use: No    CODE STATUS: FULL CODE ADVANCED DIRECTIVES: N MOST FORM COMPLETE: No. HOSPICE EDUCATION PROVIDED: None.  PPS: Patient needs standby assistwith all ADLs. Needs assistance with bathing, dressing and toileting. Patientis encouraged toambulatewith a cane but does forget.   I spent33mnutes with patient/family, [id number], support and consultation.  [name] LCSW

## 2019-07-28 NOTE — Progress Notes (Signed)
PATIENT NAME: Amanda Hancock DOB: [birth year] MRN: [id number]  PRIMARY CARE PROVIDER: Pleas Koch, NP  RESPONSIBLE PARTY:  Acct ID - Guarantor Home Phone Work Phone Relationship Acct Type  [id number] [name]  Self P/F     [place], [place] [address]    PLAN OF CARE and INTERVENTIONS:               1.  GOALS OF CARE/ ADVANCE CARE PLANNING:  Stay in home with husband and family assisting with care.               2.  PATIENT/CAREGIVER EDUCATION:  Education on fall precautions, education on s/s of infection, reviewed meds, support               3.  DISEASE STATUS:  SW and RN made scheduled palliative care visit. Palliative care team met with patient and her husband. Patient sitting in recliner chair watching TV. Patient has a left black left and a laceration approximately 5 cm in length.  Husband reports patient stayed at daughter's house Saturday night. Patient got up on Sunday morning and ate breakfast then went back to bed. Patient then rolled off bed hitting chair in bedroom.  Patient has bruising to left eye and wrist.   Patient complaining of pain in her right leg. Husband reports he has been giving patient ibuprofen and patient also taking cephalexin 562m, 2 tabs bid to prevent infection.  Husband reports patients appetite is good and patient had eggs and sausage for breakfast. Husband assists patient with bathing and husband reports patient was bathed yesterday. Patient has not received covid vaccines per husband.  Husband states patient does not go out so he is not sure he wants patient to receive vaccine. Husband to talk with their daughter CAdonis Brookand make decision if patient should receive vaccine.  Palliative care team informed husband [facility] is arranging home visits to administer vaccine.  Husband reports patient continues to have bad days and does not want to listen when he needs her to do something. Patient has no cough or shortness of breath.  Husband reports patient has been sleeping well and naps during the day. Patient is incontinent of bowel and bladder, patient wears depends. Patients breath sounds are clear. Patient has 1+ edema in right lower extremity and trace edema in left lower extremity. Patient remains confused and looks to husband to answer questions. Patient will repeat what husband says a lot of the time. Patient remains a Full Code. Family remains in agreement with palliative care services. Husband encouraged to contact palliative care with questions or concerns.      HISTORY OF PRESENT ILLNESS:  Patient is a 74year old female who resides in home with her husband.      CODE STATUS: Full Code  ADVANCED DIRECTIVES: No MOST FORM: No PPS: 50%   PHYSICAL EXAM:   VITALS: Today's Vitals   07/28/19 1005  PainSc: 2   PainLoc: Leg    LUNGS: clear to auscultation  CARDIAC: Cor RRR  EXTREMITIES: 1+ edema SKIN: left eye black with 1.5 inch gash with stitches.  Patient rolled off bed on 07/25/19   NEURO: positive for gait problems and memory problems       VNilda Simmer RN

## 2019-07-30 ENCOUNTER — Telehealth: Payer: Self-pay

## 2019-07-30 NOTE — Telephone Encounter (Signed)
[facility] Night - Client Nonclinical Telephone Record AccessNurse Client [facility] Night - Client Client Site [facility] - Night Physician Vernona Rieger - NP Contact Type Call Who Is Calling Patient / Member / Family / Caregiver Caller Name Sheldon Silvan Caller Phone Number [phone] Patient Name Amanda Hancock Patient DOB 16-Jan-1946 Call Type Message Only Information Provided Reason for Call Request for General Office Information Initial Comment Caller states she needs to confirm an appointment for her mom on Monday. Additional Comment Office hours provided. Disp. Time Disposition Final User 07/29/2019 5:02:12 PM General Information Provided Yes Monyjang, Rudi Call Closed By: Dreama Saa Transaction Date/Time: 07/29/2019 4:59:55 PM (ET)

## 2019-07-30 NOTE — Telephone Encounter (Signed)
Pt has been scheduled.  °

## 2019-08-02 ENCOUNTER — Encounter: Payer: Self-pay | Admitting: Primary Care

## 2019-08-02 ENCOUNTER — Ambulatory Visit (INDEPENDENT_AMBULATORY_CARE_PROVIDER_SITE_OTHER): Payer: PPO | Admitting: Primary Care

## 2019-08-02 ENCOUNTER — Other Ambulatory Visit: Payer: Self-pay

## 2019-08-02 DIAGNOSIS — Z4802 Encounter for removal of sutures: Secondary | ICD-10-CM | POA: Diagnosis not present

## 2019-08-02 DIAGNOSIS — S0181XA Laceration without foreign body of other part of head, initial encounter: Secondary | ICD-10-CM | POA: Insufficient documentation

## 2019-08-02 DIAGNOSIS — S0181XD Laceration without foreign body of other part of head, subsequent encounter: Secondary | ICD-10-CM | POA: Diagnosis not present

## 2019-08-02 NOTE — Patient Instructions (Signed)
Keep the site clean and dry.  Please notify me if you notice any increased redness, green drainage, increased pain.  It was a pleasure to see you today!

## 2019-08-02 NOTE — Progress Notes (Signed)
Subjective:    Patient ID: Amanda Hancock, female    DOB: September 10, 1945, 74 y.o.   MRN: [id number]  HPI  This visit occurred during the SARS-CoV-2 public health emergency.  Safety protocols were in place, including screening questions prior to the visit, additional usage of staff PPE, and extensive cleaning of exam room while observing appropriate contact time as indicated for disinfecting solutions.   Ms. Laing is a 74 year old female with a history of mixed Alzheimer's and vascular dementia, hypertension, hypercalcemia, depression who presents today with her husband for ED follow up and suture mov  She presented to [facility] ED on [facility]/09[facility]21with her family for a fall. She fell from her bed striking her head on a bedside chair. The patient sustained a laceration to the left forehead. Also reporting right shoulder pain.   During her stay in the ED she underwent CT head and cervical spine which were negative for hemorrhage and fracture. Xray of the shoulder showed chronic arthritic changes. She had one suture placed to her left forehead laceration and tolerated well. She was provided with a tetanus vaccination and discharged home with recommendations for PCP follow up.   Since her ED visit she denies dizziness and headaches. She also denies erythema and swelling to the laceration site.   Review of Systems  Constitutional: Negative for fever.  Skin: Negative for color change.  Neurological: Negative for dizziness and headaches.       Past Medical History:  Diagnosis Date  . Abnormal Pap smear    ASCUS  . Anxiety   . Chronic kidney disease    KIDNEYSTONES  . Depression   . Elevated blood pressure (not hypertension)   . Hypertension   . Vaginal Pap smear with ASC-US and positive HPV 12/20/2010  . Vaginal Pap smear, abnormal      Social History   Socioeconomic History  . Marital status: Married    Spouse name: Not on file  . Number of children: Not on file  . Years of education: Not  on file  . Highest education level: Not on file  Occupational History  . Not on file  Tobacco Use  . Smoking status: Never Smoker  . Smokeless tobacco: Never Used  Substance and Sexual Activity  . Alcohol use: No  . Drug use: No  . Sexual activity: Not Currently  Other Topics Concern  . Not on file  Social History Narrative   Married.   2 children, 3 grandchildren, 1 grandchild.   Does not have a living will.   Would desire CPR and life support if not futile.   Social Determinants of Health   Financial Resource Strain:   . Difficulty of Paying Living Expenses:   Food Insecurity:   . Worried About Charity fundraiser in the Last Year:   . Arboriculturist in the Last Year:   Transportation Needs:   . Film/video editor (Medical):   Marland Kitchen Lack of Transportation (Non-Medical):   Physical Activity:   . Days of Exercise per Week:   . Minutes of Exercise per Session:   Stress:   . Feeling of Stress :   Social Connections:   . Frequency of Communication with Friends and Family:   . Frequency of Social Gatherings with Friends and Family:   . Attends Religious Services:   . Active Member of Clubs or Organizations:   . Attends Archivist Meetings:   Marland Kitchen Marital Status:   Intimate Production manager  Violence:   . Fear of Current or Ex-Partner:   . Emotionally Abused:   Marland Kitchen Physically Abused:   . Sexually Abused:     Past Surgical History:  Procedure Laterality Date  . ABDOMINAL HYSTERECTOMY    . TUBAL LIGATION      Family History  Problem Relation Age of Onset  . Parkinsonism Mother   . Dementia Mother   . Heart attack Paternal Grandfather   . Diabetes Maternal Grandmother   . Heart attack Father   . Dementia Cousin   . Breast cancer Maternal Aunt   . Breast cancer Paternal Aunt     Allergies  Allergen Reactions  . Codeine Nausea And Vomiting and Other (See Comments)    headache  . Ace Inhibitors Cough  . Hctz [Hydrochlorothiazide]     Rash     Current  Outpatient Medications on File Prior to Visit  Medication Sig Dispense Refill  . amLODipine (NORVASC) 10 MG tablet Take 1 tablet by mouth once daily for blood pressure 90 tablet 1  . aspirin 81 MG tablet Take 81 mg by mouth daily.    . Cyanocobalamin (B-12 PO) Take 2,000 mcg by mouth daily.    . Cyanocobalamin (B-12) 2000 MCG TABS Take by mouth.    . losartan (COZAAR) 50 MG tablet Take 1 tablet by mouth once daily for blood pressure 90 tablet 2  . MELATONIN PO Take by mouth at bedtime as needed.    . Multiple Vitamins-Minerals (MULTIVITAMIN ADULT PO) Take by mouth.    . QUEtiapine (SEROQUEL) 100 MG tablet Take 200 mg by mouth at bedtime.    . sertraline (ZOLOFT) 100 MG tablet TAKE 1 TABLET BY MOUTH ONCE DAILY (Patient taking differently: Take 100 mg by mouth at bedtime. ) 90 tablet 0  . sulfamethoxazole-trimethoprim (BACTRIM DS) 800-160 MG tablet Take 1 tablet by mouth 2 (two) times daily. For urinary tract infection. (Patient not taking: Reported on 08/02/2019) 10 tablet 0   No current facility-administered medications on file prior to visit.    BP 130/70 (BP Location: Left Arm, Patient Position: Sitting, Cuff Size: Large)   Pulse (!) 56   Temp (!) 97.3 F (36.3 C)   Ht 5' (1.524 m)   Wt 182 lb (82.6 kg)   SpO2 96%   BMI 35.54 kg/m    Objective:   Physical Exam  Constitutional: She appears well-nourished.  Skin: Skin is warm and dry. No erythema.  3 cm closed laceration with suture in place. No surrounding erythema or swelling.           Assessment & Plan:

## 2019-08-02 NOTE — Assessment & Plan Note (Signed)
Sustained on 07/25/19 after rolling out of bed and striking her head on a bedside chair. One long un inturrupted suture in place.   Betadine used to cleanse site. Removed single un inturrupted suture without difficult. Patient tolerated well.  Site cleansed again with betadine solution, dressing applied.

## 2019-08-25 ENCOUNTER — Telehealth: Payer: Self-pay

## 2019-08-25 NOTE — Telephone Encounter (Signed)
Telephone call to patients daughter to schedule palliative care visit.  RN LM for daughter requesting call back to schedule time for home visit.

## 2019-09-01 ENCOUNTER — Telehealth: Payer: Self-pay

## 2019-09-01 NOTE — Telephone Encounter (Signed)
Telephone call to schedule palliative care visit.  RN LM requesting patients daughter Neysa Bonito  call back to schedule palliative care visit.

## 2019-09-01 NOTE — Telephone Encounter (Signed)
Telephone call from patients daughter Neysa Bonito.  Daughter in agreement with palliative care team making home visit 09/13/19 at 12:30 PM.

## 2019-09-10 ENCOUNTER — Other Ambulatory Visit: Payer: Self-pay

## 2019-09-10 ENCOUNTER — Ambulatory Visit (INDEPENDENT_AMBULATORY_CARE_PROVIDER_SITE_OTHER): Payer: PPO | Admitting: Family Medicine

## 2019-09-10 ENCOUNTER — Encounter: Payer: Self-pay | Admitting: Family Medicine

## 2019-09-10 VITALS — BP 150/70 | HR 63 | Temp 97.8°F | Ht 60.0 in | Wt 179.4 lb

## 2019-09-10 DIAGNOSIS — R319 Hematuria, unspecified: Secondary | ICD-10-CM | POA: Diagnosis not present

## 2019-09-10 DIAGNOSIS — G309 Alzheimer's disease, unspecified: Secondary | ICD-10-CM

## 2019-09-10 DIAGNOSIS — F0150 Vascular dementia without behavioral disturbance: Secondary | ICD-10-CM | POA: Diagnosis not present

## 2019-09-10 DIAGNOSIS — F0280 Dementia in other diseases classified elsewhere without behavioral disturbance: Secondary | ICD-10-CM | POA: Diagnosis not present

## 2019-09-10 DIAGNOSIS — R310 Gross hematuria: Secondary | ICD-10-CM | POA: Diagnosis not present

## 2019-09-10 LAB — POC URINALSYSI DIPSTICK (AUTOMATED)
Bilirubin, UA: NEGATIVE
Glucose, UA: NEGATIVE
Ketones, UA: NEGATIVE
Leukocytes, UA: NEGATIVE
Nitrite, UA: NEGATIVE
Protein, UA: POSITIVE — AB
Spec Grav, UA: >=1.03 — AB (ref 1.010–1.025)
Urobilinogen, UA: 0.2 E.U./dL
pH, UA: 6 (ref 5.0–8.0)

## 2019-09-10 MED ORDER — CEPHALEXIN 500 MG PO CAPS
500.0000 mg | ORAL_CAPSULE | Freq: Two times a day (BID) | ORAL | 0 refills | Status: DC
Start: 2019-09-10 — End: 2019-12-18

## 2019-09-10 NOTE — Patient Instructions (Signed)
You again have blood in the urine but may not be due to infection.  Urine culture today, go ahead and start antibiotic sent to pharmacy (keflex twice daily for 7 days).  If urine culture returns negative, we may stop antibiotic and discuss further evaluation with CT scan - we will be in touch with results.   Hematuria, Adult Hematuria is blood in the urine. Blood may be visible in the urine, or it may be identified with a test. This condition can be caused by infections of the bladder, urethra, kidney, or prostate. Other possible causes include:  Kidney stones.  Cancer of the urinary tract.  Too much calcium in the urine.  Conditions that are passed from parent to child (inherited conditions).  Exercise that requires a lot of energy. Infections can usually be treated with medicine, and a kidney stone usually will pass through your urine. If neither of these is the cause of your hematuria, more tests may be needed to identify the cause of your symptoms. It is very important to tell your health care provider about any blood in your urine, even if it is painless or the blood stops without treatment. Blood in the urine, when it happens and then stops and then happens again, can be a symptom of a very serious condition, including cancer. There is no pain in the initial stages of many urinary cancers. Follow these instructions at home: Medicines  Take over-the-counter and prescription medicines only as told by your health care provider.  If you were prescribed an antibiotic medicine, take it as told by your health care provider. Do not stop taking the antibiotic even if you start to feel better. Eating and drinking  Drink enough fluid to keep your urine clear or pale yellow. It is recommended that you drink 3-4 quarts (2.8-3.8 L) a day. If you have been diagnosed with an infection, it is recommended that you drink cranberry juice in addition to large amounts of water.  Avoid caffeine, tea, and  carbonated beverages. These tend to irritate the bladder.  Avoid alcohol because it may irritate the prostate (men). General instructions  If you have been diagnosed with a kidney stone, follow your health care provider's instructions about straining your urine to catch the stone.  Empty your bladder often. Avoid holding urine for long periods of time.  If you are female: ? After a bowel movement, wipe from front to back and use each piece of toilet paper only once. ? Empty your bladder before and after sex.  Pay attention to any changes in your symptoms. Tell your health care provider about any changes or any new symptoms.  It is your responsibility to get your test results. Ask your health care provider, or the department performing the test, when your results will be ready.  Keep all follow-up visits as told by your health care provider. This is important. Contact a health care provider if:  You develop back pain.  You have a fever.  You have nausea or vomiting.  Your symptoms do not improve after 3 days.  Your symptoms get worse. Get help right away if:  You develop severe vomiting and are unable take medicine without vomiting.  You develop severe pain in your back or abdomen even though you are taking medicine.  You pass a large amount of blood in your urine.  You pass blood clots in your urine.  You feel very weak or like you might faint.  You faint. Summary  Hematuria  is blood in the urine. It has many possible causes.  It is very important that you tell your health care provider about any blood in your urine, even if it is painless or the blood stops without treatment.  Take over-the-counter and prescription medicines only as told by your health care provider.  Drink enough fluid to keep your urine clear or pale yellow. This information is not intended to replace advice given to you by your health care provider. Make sure you discuss any questions you have  with your health care provider. Document Revised: 07/29/2018 Document Reviewed: 04/06/2016 Elsevier Patient Education  2020 [employer].

## 2019-09-10 NOTE — Assessment & Plan Note (Signed)
Recurrent hematuria, possibly due to UTI - will start keflex 500mg  BID 1 wk course and send UCx.  Discussed if UCx negative, would merit further evaluation r/o tumor/mass vs other cause which could include uro eval, CT scan, cystoscopy. Husband doesn't think they would want to proceed with cystoscopy but would be open to CT scan imaging if needed.

## 2019-09-10 NOTE — Progress Notes (Addendum)
This visit was conducted in person.  BP (!) 150/70 (BP Location: Right Arm, Patient Position: Sitting, Cuff Size: Large)   Pulse 63   Temp 97.8 F (36.6 C) (Temporal)   Ht 5' (1.524 m)   Wt 179 lb 7 oz (81.4 kg)   SpO2 96%   BMI 35.04 kg/m    CC: hematuria, leg swelling, increasing confusion Subjective:    Patient ID: Amanda Hancock, female    DOB: 1946-02-26, 74 y.o.   MRN: [id number]  HPI: Amanda Hancock is a 74 y.o. female presenting on 09/10/2019 for Hematuria (C/o pink colored urine, some confusion and bilateral ankle swelling.  Sxs started 3-4 wks ago.  Pt accompanied by husband, Claude- temp 98.2.)   Known h/o Alzheimer's - here with husband who supplants history.  They noticed some blood in urine several days ago (blood tinged urine) along with left lateral abdominal pain (husband attributes to irritation from depends).  Chronic urinary urgency. Wears depends diapers.  Denies dysuria, fevers/chills, abd pain, nausea/vomiting. Denies ongoing bleeding. Appetite ok.  She is on aspirin 81mg  daily.  Never smoker.   Also noticing tender red rash under left breast for several week. Tried petroleum jelly cream and gold bond.   Similar symptoms 3/20201 treated for UTI with 5d bactrim course.      Relevant past medical, surgical, family and social history reviewed and updated as indicated. Interim medical history since our last visit reviewed. Allergies and medications reviewed and updated. Outpatient Medications Prior to Visit  Medication Sig Dispense Refill  . amLODipine (NORVASC) 10 MG tablet Take 1 tablet by mouth once daily for blood pressure 90 tablet 1  . aspirin 81 MG tablet Take 81 mg by mouth daily.    . Cyanocobalamin (B-12 PO) Take 2,000 mcg by mouth daily.    . Cyanocobalamin (B-12) 2000 MCG TABS Take by mouth.    . losartan (COZAAR) 50 MG tablet Take 1 tablet by mouth once daily for blood pressure 90 tablet 2  . MELATONIN PO Take by mouth at bedtime as needed.     . Multiple Vitamins-Minerals (MULTIVITAMIN ADULT PO) Take by mouth.    . QUEtiapine (SEROQUEL) 100 MG tablet Take 200 mg by mouth at bedtime.    . sertraline (ZOLOFT) 100 MG tablet TAKE 1 TABLET BY MOUTH ONCE DAILY (Patient taking differently: Take 100 mg by mouth at bedtime. ) 90 tablet 0  . sulfamethoxazole-trimethoprim (BACTRIM DS) 800-160 MG tablet Take 1 tablet by mouth 2 (two) times daily. For urinary tract infection. 10 tablet 0   No facility-administered medications prior to visit.     Per HPI unless specifically indicated in ROS section below Review of Systems Objective:  BP (!) 150/70 (BP Location: Right Arm, Patient Position: Sitting, Cuff Size: Large)   Pulse 63   Temp 97.8 F (36.6 C) (Temporal)   Ht 5' (1.524 m)   Wt 179 lb 7 oz (81.4 kg)   SpO2 96%   BMI 35.04 kg/m   Wt Readings from Last 3 Encounters:  09/10/19 179 lb 7 oz (81.4 kg)  08/02/19 182 lb (82.6 kg)  07/25/19 180 lb (81.6 kg)      Physical Exam Vitals and nursing note reviewed.  Constitutional:      Appearance: Normal appearance. She is not ill-appearing.  Cardiovascular:     Rate and Rhythm: Normal rate and regular rhythm.     Pulses: Normal pulses.     Heart sounds: Normal heart sounds. No murmur  heard.   Pulmonary:     Effort: Pulmonary effort is normal. No respiratory distress.     Breath sounds: Normal breath sounds. No wheezing, rhonchi or rales.  Abdominal:     General: Abdomen is flat. Bowel sounds are normal. There is no distension.     Palpations: Abdomen is soft. There is no mass.     Tenderness: There is no abdominal tenderness. There is no right CVA tenderness, left CVA tenderness, guarding or rebound.     Hernia: No hernia is present.  Musculoskeletal:     Right lower leg: No edema.     Left lower leg: No edema.  Skin:    Findings: Rash (largely resolving rash below left breast) present.  Neurological:     Mental Status: She is alert.  Psychiatric:        Mood and Affect:  Mood normal.        Behavior: Behavior normal.       Results for orders placed or performed in visit on 09/10/19  POCT Urinalysis Dipstick (Automated)  Result Value Ref Range   Color, UA yellow    Clarity, UA clear    Glucose, UA Negative Negative   Bilirubin, UA negative    Ketones, UA negative    Spec Grav, UA >=1.030 (A) 1.010 - 1.025   Blood, UA 3+    pH, UA 6.0 5.0 - 8.0   Protein, UA Positive (A) Negative   Urobilinogen, UA 0.2 0.2 or 1.0 E.U./dL   Nitrite, UA negative    Leukocytes, UA Negative Negative   Lab Results  Component Value Date   CREATININE 0.92 12/03/2018   BUN 17 12/03/2018   NA 144 12/03/2018   K 4.6 12/03/2018   CL 109 12/03/2018   CO2 32 12/03/2018    Lab Results  Component Value Date   WBC 6.1 12/03/2018   HGB 13.2 12/03/2018   HCT 39.4 12/03/2018   MCV 88.2 12/03/2018   PLT 148.0 (L) 12/03/2018    Assessment & Plan:  This visit occurred during the SARS-CoV-2 public health emergency.  Safety protocols were in place, including screening questions prior to the visit, additional usage of staff PPE, and extensive cleaning of exam room while observing appropriate contact time as indicated for disinfecting solutions.   Problem List Items Addressed This Visit    Mixed Alzheimer's and vascular dementia (HCC)   Gross hematuria - Primary    Recurrent hematuria, possibly due to UTI - will start keflex 500mg  BID 1 wk course and send UCx.  Discussed if UCx negative, would merit further evaluation r/o tumor/mass vs other cause which could include uro eval, CT scan, cystoscopy. Husband doesn't think they would want to proceed with cystoscopy but would be open to CT scan imaging if needed.       Relevant Orders   POCT Urinalysis Dipstick (Automated) (Completed)   Urine Culture       Meds ordered this encounter  Medications  . cephALEXin (KEFLEX) 500 MG capsule    Sig: Take 1 capsule (500 mg total) by mouth 2 (two) times daily.    Dispense:  14 capsule     Refill:  0   Orders Placed This Encounter  Procedures  . Urine Culture  . POCT Urinalysis Dipstick (Automated)    Patient instructions: You again have blood in the urine but may not be due to infection.  Urine culture today, go ahead and start antibiotic sent to pharmacy (keflex twice daily for 7  days).  If urine culture returns negative, we may stop antibiotic and discuss further evaluation with CT scan - we will be in touch with results.   Follow up plan: Return if symptoms worsen or fail to improve.  Eustaquio Boyden, MD

## 2019-09-11 LAB — URINE CULTURE
MICRO NUMBER:: 10635641
SPECIMEN QUALITY:: ADEQUATE

## 2019-09-13 ENCOUNTER — Other Ambulatory Visit: Payer: PPO

## 2019-09-13 ENCOUNTER — Other Ambulatory Visit: Payer: Self-pay | Admitting: Family Medicine

## 2019-09-13 ENCOUNTER — Telehealth: Payer: Self-pay

## 2019-09-13 ENCOUNTER — Other Ambulatory Visit: Payer: Self-pay

## 2019-09-13 DIAGNOSIS — Z515 Encounter for palliative care: Secondary | ICD-10-CM

## 2019-09-13 DIAGNOSIS — R310 Gross hematuria: Secondary | ICD-10-CM

## 2019-09-13 NOTE — Progress Notes (Signed)
PATIENT NAME: Amanda Hancock DOB: 10/15/1945 MRN: [id number]  PRIMARY CARE PROVIDER: Pleas Koch, NP  RESPONSIBLE PARTY:  Acct ID - Guarantor Home Phone Work Phone Relationship Acct Type  [id number] [name]  Self P/F     [place], [place] [address]    PLAN OF CARE and INTERVENTIONS:               1.  GOALS OF CARE/ ADVANCE CARE PLANNING:  Remain in home with husband for as long as care can be managed.               2.  PATIENT/CAREGIVER EDUCATION:  Education on fall precautions, education on s/s of infection, reviewed meds, support               3.  DISEASE STATUS:  SW [place] and RN made  scheduled palliative care home visit. Palliative care team met with patient and her husband. Patient sitting in her recliner watching TV. Husband reports patient's memory is getting worse and patient cannot recall how to get to the bathroom. Patient saw MD on Friday due to having blood in her urine. Patients currently being treated for UTI with Keflex. Patients current weight 179 lbs. Husband reports patient suffered a fall in the bathroom 2 weeks ago. Patient has a small fading bruise on left cheek. Husband reports patients appetite remains good. Patient has not had any nausea or vomiting. Patient remains independent with ambulation. Husband reports patient gets upset with him at times as he gets frustrated with patient due to memory issues. Patients vital signs are stable. Husband reports patient continues to have difficulty with constipation. Husband reports he has not been giving patient stool softeners. Nurse made recommendation to try giving patient prunes or prune juice to aide with constipation. Husband reports patient has not had any cough or shortness of breath. Patient has no edema in her lower extremities. Husband reports patient sleeps well most nights but will wake in the early mornings and sometimes refuse to go back to bed. Husband reports family is being  supportive and will come and stay for short periods to give him a break or let him mow the yard. Nurse reviewed patient's medications with husband. Husband remains in agreement with palliative care services. Husband encouraged to contact palliative care with questions or concerns.      HISTORY OF PRESENT ILLNESS:  Patient is a 74 year old female who resides in home with her husband.  Patient is followed by palliative care.  Patient is seen monthly and PRN.  CODE STATUS: Full Code  ADVANCED DIRECTIVES: N MOST FORM: N PPS: 60%   PHYSICAL EXAM:   VITALS: Today's Vitals   09/13/19 1246  BP: (!) 148/82  Pulse: (!) 55  Resp: 16  Temp: (!) 97.2 F (36.2 C)  TempSrc: Temporal  SpO2: 98%  Weight: 179 lb (81.2 kg)  PainSc: 0-No pain    LUNGS: clear to auscultation  CARDIAC: Cor RRR and Cor Brady  EXTREMITIES: none edema SKIN: no visible open areas of skin breakdown  NEURO: positive for memory problems/ frequent falls       Nilda Simmer, RN

## 2019-09-14 NOTE — Telephone Encounter (Signed)
NA

## 2019-09-22 ENCOUNTER — Other Ambulatory Visit: Payer: Self-pay | Admitting: Primary Care

## 2019-09-22 DIAGNOSIS — I10 Essential (primary) hypertension: Secondary | ICD-10-CM

## 2019-09-24 ENCOUNTER — Other Ambulatory Visit: Payer: Self-pay

## 2019-09-24 ENCOUNTER — Ambulatory Visit
Admission: RE | Admit: 2019-09-24 | Discharge: 2019-09-24 | Disposition: A | Discharge status: Home / Self Care | Payer: PPO | Source: Ambulatory Visit | Attending: Family Medicine | Admitting: Family Medicine

## 2019-09-24 DIAGNOSIS — I700 Atherosclerosis of aorta: Secondary | ICD-10-CM | POA: Diagnosis not present

## 2019-09-24 DIAGNOSIS — K5730 Diverticulosis of large intestine without perforation or abscess without bleeding: Secondary | ICD-10-CM | POA: Diagnosis not present

## 2019-09-24 DIAGNOSIS — R310 Gross hematuria: Secondary | ICD-10-CM | POA: Diagnosis not present

## 2019-09-24 DIAGNOSIS — N200 Calculus of kidney: Secondary | ICD-10-CM | POA: Diagnosis not present

## 2019-09-24 LAB — POCT I-STAT CREATININE: Creatinine, Ser: 0.9 mg/dL (ref 0.44–1.00)

## 2019-09-24 MED ORDER — IOHEXOL 300 MG/ML  SOLN
125.0000 mL | Freq: Once | INTRAMUSCULAR | Status: AC | PRN
  Administered 2019-09-24: 125 mL via INTRAVENOUS

## 2019-10-07 ENCOUNTER — Telehealth: Payer: Self-pay

## 2019-10-07 NOTE — Telephone Encounter (Signed)
Palliative care SW LVM for patient/patients daughter to scheduled in home visit. Awaiting return call.

## 2019-11-25 ENCOUNTER — Other Ambulatory Visit: Payer: Self-pay | Admitting: Primary Care

## 2019-11-25 DIAGNOSIS — Z1231 Encounter for screening mammogram for malignant neoplasm of breast: Secondary | ICD-10-CM

## 2019-12-18 ENCOUNTER — Emergency Department: Payer: PPO

## 2019-12-18 ENCOUNTER — Inpatient Hospital Stay
Admission: EM | Admit: 2019-12-18 | Discharge: 2019-12-28 | DRG: 690 | Disposition: A | Discharge status: Hospice | Payer: PPO | Attending: Internal Medicine | Admitting: Internal Medicine

## 2019-12-18 DIAGNOSIS — Z888 Allergy status to other drugs, medicaments and biological substances status: Secondary | ICD-10-CM

## 2019-12-18 DIAGNOSIS — I700 Atherosclerosis of aorta: Secondary | ICD-10-CM | POA: Diagnosis not present

## 2019-12-18 DIAGNOSIS — Z885 Allergy status to narcotic agent status: Secondary | ICD-10-CM

## 2019-12-18 DIAGNOSIS — F323 Major depressive disorder, single episode, severe with psychotic features: Secondary | ICD-10-CM | POA: Diagnosis present

## 2019-12-18 DIAGNOSIS — I6782 Cerebral ischemia: Secondary | ICD-10-CM | POA: Diagnosis not present

## 2019-12-18 DIAGNOSIS — G3183 Dementia with Lewy bodies: Secondary | ICD-10-CM | POA: Diagnosis present

## 2019-12-18 DIAGNOSIS — R279 Unspecified lack of coordination: Secondary | ICD-10-CM | POA: Diagnosis not present

## 2019-12-18 DIAGNOSIS — B9620 Unspecified Escherichia coli [E. coli] as the cause of diseases classified elsewhere: Secondary | ICD-10-CM | POA: Diagnosis not present

## 2019-12-18 DIAGNOSIS — Z79899 Other long term (current) drug therapy: Secondary | ICD-10-CM

## 2019-12-18 DIAGNOSIS — Z9851 Tubal ligation status: Secondary | ICD-10-CM

## 2019-12-18 DIAGNOSIS — F03918 Unspecified dementia, unspecified severity, with other behavioral disturbance: Secondary | ICD-10-CM | POA: Diagnosis present

## 2019-12-18 DIAGNOSIS — I10 Essential (primary) hypertension: Secondary | ICD-10-CM | POA: Diagnosis present

## 2019-12-18 DIAGNOSIS — S0990XA Unspecified injury of head, initial encounter: Secondary | ICD-10-CM | POA: Diagnosis not present

## 2019-12-18 DIAGNOSIS — F0281 Dementia in other diseases classified elsewhere with behavioral disturbance: Secondary | ICD-10-CM | POA: Diagnosis not present

## 2019-12-18 DIAGNOSIS — Z66 Do not resuscitate: Secondary | ICD-10-CM | POA: Diagnosis not present

## 2019-12-18 DIAGNOSIS — R4182 Altered mental status, unspecified: Secondary | ICD-10-CM

## 2019-12-18 DIAGNOSIS — N3091 Cystitis, unspecified with hematuria: Secondary | ICD-10-CM

## 2019-12-18 DIAGNOSIS — F0280 Dementia in other diseases classified elsewhere without behavioral disturbance: Secondary | ICD-10-CM | POA: Diagnosis not present

## 2019-12-18 DIAGNOSIS — S7001XA Contusion of right hip, initial encounter: Secondary | ICD-10-CM | POA: Diagnosis present

## 2019-12-18 DIAGNOSIS — E669 Obesity, unspecified: Secondary | ICD-10-CM | POA: Diagnosis not present

## 2019-12-18 DIAGNOSIS — W19XXXA Unspecified fall, initial encounter: Secondary | ICD-10-CM | POA: Diagnosis present

## 2019-12-18 DIAGNOSIS — N136 Pyonephrosis: Secondary | ICD-10-CM | POA: Diagnosis not present

## 2019-12-18 DIAGNOSIS — G309 Alzheimer's disease, unspecified: Secondary | ICD-10-CM | POA: Diagnosis not present

## 2019-12-18 DIAGNOSIS — R310 Gross hematuria: Secondary | ICD-10-CM | POA: Diagnosis not present

## 2019-12-18 DIAGNOSIS — N1330 Unspecified hydronephrosis: Secondary | ICD-10-CM | POA: Diagnosis present

## 2019-12-18 DIAGNOSIS — F419 Anxiety disorder, unspecified: Secondary | ICD-10-CM | POA: Diagnosis not present

## 2019-12-18 DIAGNOSIS — Z9114 Patient's other noncompliance with medication regimen: Secondary | ICD-10-CM

## 2019-12-18 DIAGNOSIS — M542 Cervicalgia: Secondary | ICD-10-CM | POA: Diagnosis present

## 2019-12-18 DIAGNOSIS — Z6834 Body mass index (BMI) 34.0-34.9, adult: Secondary | ICD-10-CM

## 2019-12-18 DIAGNOSIS — R410 Disorientation, unspecified: Secondary | ICD-10-CM | POA: Diagnosis not present

## 2019-12-18 DIAGNOSIS — Z90710 Acquired absence of both cervix and uterus: Secondary | ICD-10-CM

## 2019-12-18 DIAGNOSIS — F0151 Vascular dementia with behavioral disturbance: Secondary | ICD-10-CM | POA: Diagnosis not present

## 2019-12-18 DIAGNOSIS — Z8249 Family history of ischemic heart disease and other diseases of the circulatory system: Secondary | ICD-10-CM | POA: Diagnosis not present

## 2019-12-18 DIAGNOSIS — R5381 Other malaise: Secondary | ICD-10-CM | POA: Diagnosis not present

## 2019-12-18 DIAGNOSIS — N132 Hydronephrosis with renal and ureteral calculous obstruction: Secondary | ICD-10-CM | POA: Diagnosis not present

## 2019-12-18 DIAGNOSIS — N390 Urinary tract infection, site not specified: Secondary | ICD-10-CM | POA: Diagnosis present

## 2019-12-18 DIAGNOSIS — N3001 Acute cystitis with hematuria: Secondary | ICD-10-CM | POA: Diagnosis not present

## 2019-12-18 DIAGNOSIS — F0391 Unspecified dementia with behavioral disturbance: Secondary | ICD-10-CM | POA: Diagnosis present

## 2019-12-18 DIAGNOSIS — F0150 Vascular dementia without behavioral disturbance: Secondary | ICD-10-CM | POA: Diagnosis present

## 2019-12-18 DIAGNOSIS — R309 Painful micturition, unspecified: Secondary | ICD-10-CM | POA: Diagnosis not present

## 2019-12-18 DIAGNOSIS — Z7982 Long term (current) use of aspirin: Secondary | ICD-10-CM

## 2019-12-18 DIAGNOSIS — M6281 Muscle weakness (generalized): Secondary | ICD-10-CM | POA: Diagnosis not present

## 2019-12-18 DIAGNOSIS — N200 Calculus of kidney: Secondary | ICD-10-CM

## 2019-12-18 DIAGNOSIS — K5730 Diverticulosis of large intestine without perforation or abscess without bleeding: Secondary | ICD-10-CM | POA: Diagnosis not present

## 2019-12-18 DIAGNOSIS — M25552 Pain in left hip: Secondary | ICD-10-CM | POA: Diagnosis not present

## 2019-12-18 DIAGNOSIS — Z20822 Contact with and (suspected) exposure to covid-19: Secondary | ICD-10-CM | POA: Diagnosis not present

## 2019-12-18 DIAGNOSIS — Z515 Encounter for palliative care: Secondary | ICD-10-CM

## 2019-12-18 DIAGNOSIS — G319 Degenerative disease of nervous system, unspecified: Secondary | ICD-10-CM | POA: Diagnosis not present

## 2019-12-18 DIAGNOSIS — Z7189 Other specified counseling: Secondary | ICD-10-CM | POA: Diagnosis not present

## 2019-12-18 LAB — LACTIC ACID, PLASMA: Lactic Acid, Venous: 1.3 mmol/L (ref 0.5–1.9)

## 2019-12-18 LAB — CBC WITH DIFFERENTIAL/PLATELET
Abs Immature Granulocytes: 0.03 10*3/uL (ref 0.00–0.07)
Basophils Absolute: 0 10*3/uL (ref 0.0–0.1)
Basophils Relative: 0 %
Eosinophils Absolute: 0 10*3/uL (ref 0.0–0.5)
Eosinophils Relative: 0 %
HCT: 32.4 % — ABNORMAL LOW (ref 36.0–46.0)
Hemoglobin: 10.9 g/dL — ABNORMAL LOW (ref 12.0–15.0)
Immature Granulocytes: 0 %
Lymphocytes Relative: 9 %
Lymphs Abs: 0.8 10*3/uL (ref 0.7–4.0)
MCH: 28.3 pg (ref 26.0–34.0)
MCHC: 33.6 g/dL (ref 30.0–36.0)
MCV: 84.2 fL (ref 80.0–100.0)
Monocytes Absolute: 0.6 10*3/uL (ref 0.1–1.0)
Monocytes Relative: 6 %
Neutro Abs: 7.2 10*3/uL (ref 1.7–7.7)
Neutrophils Relative %: 85 %
Platelets: 178 10*3/uL (ref 150–400)
RBC: 3.85 MIL/uL — ABNORMAL LOW (ref 3.87–5.11)
RDW: 13.1 % (ref 11.5–15.5)
WBC: 8.6 10*3/uL (ref 4.0–10.5)
nRBC: 0.2 % (ref 0.0–0.2)

## 2019-12-18 LAB — URINALYSIS, COMPLETE (UACMP) WITH MICROSCOPIC
Bilirubin Urine: NEGATIVE
Glucose, UA: NEGATIVE mg/dL
Ketones, ur: 5 mg/dL — AB
Nitrite: NEGATIVE
Protein, ur: 100 mg/dL — AB
RBC / HPF: >50 RBC/hpf — ABNORMAL HIGH (ref 0–5)
Specific Gravity, Urine: 1.013 (ref 1.005–1.030)
WBC, UA: >50 WBC/hpf — ABNORMAL HIGH (ref 0–5)
pH: 5 (ref 5.0–8.0)

## 2019-12-18 LAB — RESPIRATORY PANEL BY RT PCR (FLU A&B, COVID)
Influenza A by PCR: NEGATIVE
Influenza B by PCR: NEGATIVE
SARS Coronavirus 2 by RT PCR: NEGATIVE

## 2019-12-18 LAB — COMPREHENSIVE METABOLIC PANEL
ALT: 15 U/L (ref 0–44)
AST: 14 U/L — ABNORMAL LOW (ref 15–41)
Albumin: 3.2 g/dL — ABNORMAL LOW (ref 3.5–5.0)
Alkaline Phosphatase: 96 U/L (ref 38–126)
Anion gap: 9 (ref 5–15)
BUN: 18 mg/dL (ref 8–23)
CO2: 25 mmol/L (ref 22–32)
Calcium: 10.4 mg/dL — ABNORMAL HIGH (ref 8.9–10.3)
Chloride: 105 mmol/L (ref 98–111)
Creatinine, Ser: 1.02 mg/dL — ABNORMAL HIGH (ref 0.44–1.00)
GFR calc Af Amer: >60 mL/min (ref >60)
GFR calc non Af Amer: 54 mL/min — ABNORMAL LOW (ref >60)
Glucose, Bld: 116 mg/dL — ABNORMAL HIGH (ref 70–99)
Potassium: 3.4 mmol/L — ABNORMAL LOW (ref 3.5–5.1)
Sodium: 139 mmol/L (ref 135–145)
Total Bilirubin: 0.9 mg/dL (ref 0.3–1.2)
Total Protein: 6.3 g/dL — ABNORMAL LOW (ref 6.5–8.1)

## 2019-12-18 LAB — TROPONIN I (HIGH SENSITIVITY)
Troponin I (High Sensitivity): 13 ng/L (ref <18)
Troponin I (High Sensitivity): 13 ng/L (ref <18)

## 2019-12-18 MED ORDER — SODIUM CHLORIDE 0.9 % IV SOLN: 250.0000 mL | INTRAVENOUS | Status: DC | PRN

## 2019-12-18 MED ORDER — LOSARTAN POTASSIUM 50 MG PO TABS
50.0000 mg | ORAL_TABLET | Freq: Every day | ORAL | Status: DC
  Administered 2019-12-19 – 2019-12-22 (×4): 50 mg via ORAL
  Filled 2019-12-18 (×4): qty 1

## 2019-12-18 MED ORDER — SODIUM CHLORIDE 0.9% FLUSH: 3.0000 mL | Freq: Two times a day (BID) | INTRAVENOUS | Status: DC

## 2019-12-18 MED ORDER — SODIUM CHLORIDE 0.45 % IV SOLN: INTRAVENOUS | Status: DC

## 2019-12-18 MED ORDER — CEFDINIR 300 MG PO CAPS
300.0000 mg | ORAL_CAPSULE | Freq: Two times a day (BID) | ORAL | Status: DC
  Administered 2019-12-18 – 2019-12-19 (×2): 300 mg via ORAL
  Filled 2019-12-18 (×3): qty 1

## 2019-12-18 MED ORDER — SERTRALINE HCL 50 MG PO TABS
100.0000 mg | ORAL_TABLET | Freq: Every day | ORAL | Status: DC
  Administered 2019-12-18 – 2019-12-28 (×11): 100 mg via ORAL
  Filled 2019-12-18 (×11): qty 2

## 2019-12-18 MED ORDER — CEFDINIR 300 MG PO CAPS
300.0000 mg | ORAL_CAPSULE | Freq: Two times a day (BID) | ORAL | Status: DC
  Filled 2019-12-18: qty 1

## 2019-12-18 MED ORDER — MELATONIN 5 MG PO TABS
2.5000 mg | ORAL_TABLET | Freq: Every day | ORAL | Status: DC
  Administered 2019-12-18 – 2019-12-27 (×10): 2.5 mg via ORAL
  Filled 2019-12-18: qty 0.5
  Filled 2019-12-18: qty 1
  Filled 2019-12-18: qty 0.5
  Filled 2019-12-18 (×2): qty 1
  Filled 2019-12-18: qty 0.5
  Filled 2019-12-18 (×3): qty 1
  Filled 2019-12-18: qty 0.5
  Filled 2019-12-18 (×2): qty 1

## 2019-12-18 MED ORDER — ASPIRIN EC 81 MG PO TBEC
81.0000 mg | DELAYED_RELEASE_TABLET | Freq: Every day | ORAL | Status: DC
  Administered 2019-12-18 – 2019-12-24 (×7): 81 mg via ORAL
  Filled 2019-12-18 (×7): qty 1

## 2019-12-18 MED ORDER — SODIUM CHLORIDE 0.9 % IV SOLN
1.0000 g | Freq: Once | INTRAVENOUS | Status: AC
  Administered 2019-12-18: 1 g via INTRAVENOUS
  Filled 2019-12-18: qty 10

## 2019-12-18 MED ORDER — POTASSIUM CHLORIDE CRYS ER 20 MEQ PO TBCR
40.0000 meq | EXTENDED_RELEASE_TABLET | Freq: Once | ORAL | Status: AC
  Administered 2019-12-18: 40 meq via ORAL
  Filled 2019-12-18: qty 2

## 2019-12-18 MED ORDER — SODIUM CHLORIDE 0.9 % IV BOLUS
1000.0000 mL | Freq: Once | INTRAVENOUS | Status: AC
  Administered 2019-12-18: 1000 mL via INTRAVENOUS

## 2019-12-18 MED ORDER — VITAMIN B-12 1000 MCG PO TABS
2000.0000 ug | ORAL_TABLET | Freq: Every day | ORAL | Status: DC
  Administered 2019-12-19 – 2019-12-24 (×6): 2000 ug via ORAL
  Filled 2019-12-18 (×6): qty 2

## 2019-12-18 MED ORDER — ACETAMINOPHEN 650 MG RE SUPP: 650.0000 mg | Freq: Four times a day (QID) | RECTAL | Status: DC | PRN

## 2019-12-18 MED ORDER — ENOXAPARIN SODIUM 40 MG/0.4ML ~~LOC~~ SOLN
40.0000 mg | SUBCUTANEOUS | Status: DC
  Administered 2019-12-18 – 2019-12-24 (×6): 40 mg via SUBCUTANEOUS
  Filled 2019-12-18 (×6): qty 0.4

## 2019-12-18 MED ORDER — SENNA 8.6 MG PO TABS
1.0000 | ORAL_TABLET | Freq: Two times a day (BID) | ORAL | Status: DC
  Administered 2019-12-18 – 2019-12-28 (×17): 8.6 mg via ORAL
  Filled 2019-12-18 (×17): qty 1

## 2019-12-18 MED ORDER — AMLODIPINE BESYLATE 10 MG PO TABS
10.0000 mg | ORAL_TABLET | Freq: Every day | ORAL | Status: DC
  Administered 2019-12-18 – 2019-12-27 (×8): 10 mg via ORAL
  Filled 2019-12-18: qty 2
  Filled 2019-12-18 (×5): qty 1
  Filled 2019-12-18: qty 2
  Filled 2019-12-18 (×2): qty 1

## 2019-12-18 MED ORDER — SODIUM CHLORIDE 0.9% FLUSH: 3.0000 mL | INTRAVENOUS | Status: DC | PRN

## 2019-12-18 MED ORDER — SODIUM CHLORIDE 0.9 % IV SOLN: 1.0000 g | INTRAVENOUS | Status: DC

## 2019-12-18 MED ORDER — ACETAMINOPHEN 325 MG PO TABS
650.0000 mg | ORAL_TABLET | Freq: Four times a day (QID) | ORAL | Status: DC | PRN
  Filled 2019-12-18: qty 2

## 2019-12-18 MED ORDER — DONEPEZIL HCL 5 MG PO TABS
5.0000 mg | ORAL_TABLET | Freq: Every day | ORAL | Status: DC
  Administered 2019-12-18 – 2019-12-27 (×9): 5 mg via ORAL
  Filled 2019-12-18 (×11): qty 1

## 2019-12-18 MED ORDER — QUETIAPINE FUMARATE 25 MG PO TABS
50.0000 mg | ORAL_TABLET | Freq: Every day | ORAL | Status: DC
  Administered 2019-12-18 – 2019-12-22 (×5): 50 mg via ORAL
  Filled 2019-12-18 (×5): qty 2

## 2019-12-18 NOTE — H&P (Signed)
History and Physical    Amanda Hancock YPP:[id number] DOB: February 20, 1946 DOA: 12/18/2019  PCP: Doreene Nest, NP (Confirm with patient/family/NH records and if not entered, this has to be entered at [facility] point of entry) Patient coming from: home  I have personally briefly reviewed patient's old medical records in [facility]  Chief Complaint: increased confusion, hematuria  HPI: Amanda Hancock is a 74 y.o. female with medical history significant of dementia, HTN, nephrolithiasis presented to [facility] due to increased confusion from baseline and recurrent hematuria. She has known non-obstructing nephrolithiasis and prior episodes of hematuria due to cystitis.   ED Course: 989.7  139/90  68  13. Lab notable for Covid negative, glucose 116, Cr 1.3, CXR negative, lactic acid 1.3, WBC 8.6 w/ 85/9/6. U/A w/ many bacteria, >50 RBC/hpf, > 50 WBC/hpf. Patient was given 1 g IV rocephin. TRH called to admit for complete evaluation and tx of possible upper tract infection/obstruction.  Review of Systems: As per HPI otherwise 10 point review of systems negative.    Past Medical History:  Diagnosis Date  . Abnormal Pap smear    ASCUS  . Anxiety   . Chronic kidney disease    KIDNEYSTONES  . Depression   . Elevated blood pressure (not hypertension)   . Hypertension   . Vaginal Pap smear with ASC-US and positive HPV 12/20/2010  . Vaginal Pap smear, abnormal     Past Surgical History:  Procedure Laterality Date  . ABDOMINAL HYSTERECTOMY    . TUBAL LIGATION     Soc hx -  Married, lives with husband, daugher lives across the street.    reports that she has never smoked. She has never used smokeless tobacco. She reports that she does not drink alcohol and does not use drugs.  Allergies  Allergen Reactions  . Codeine Nausea And Vomiting and Other (See Comments)    headache  . Ace Inhibitors Cough  . Hctz [Hydrochlorothiazide]     Rash     Family History  Problem Relation Age of  Onset  . Parkinsonism Mother   . Dementia Mother   . Heart attack Paternal Grandfather   . Diabetes Maternal Grandmother   . Heart attack Father   . Dementia Cousin   . Breast cancer Maternal Aunt   . Breast cancer Paternal Aunt      Prior to Admission medications   Medication Sig Start Date End Date Taking? Authorizing Provider  amLODipine (NORVASC) 10 MG tablet Take 1 tablet by mouth once daily for blood pressure Patient taking differently: Take 10 mg by mouth at bedtime.  09/23/19  Yes Doreene Nest, NP  aspirin 81 MG tablet Take 81 mg by mouth daily.   Yes [provider]  Cyanocobalamin (B-12 PO) Take 2,000 mcg by mouth daily.   Yes [provider]  donepezil (ARICEPT) 5 MG tablet Take 5 mg by mouth at bedtime.   Yes [provider]  Ibuprofen-diphenhydrAMINE HCl 200-25 MG CAPS Take 1 tablet by mouth at bedtime.   Yes [provider]  losartan (COZAAR) 50 MG tablet Take 1 tablet by mouth once daily for blood pressure Patient taking differently: Take 50 mg by mouth daily.  09/23/19  Yes Doreene Nest, NP  melatonin 3 MG TABS tablet Take 3 mg by mouth at bedtime.   Yes [provider]  Multiple Vitamins-Minerals (MULTIVITAMIN ADULT PO) Take 1 tablet by mouth daily.    Yes [provider]  QUEtiapine (SEROQUEL)  50 MG tablet Take 50 mg by mouth at bedtime.    Yes [provider]  sertraline (ZOLOFT) 100 MG tablet TAKE 1 TABLET BY MOUTH ONCE DAILY Patient taking differently: Take 100 mg by mouth daily.  06/25/16  Yes Dianne DunAron, Talia M, MD    Physical Exam: Vitals:   12/18/19 1229 12/18/19 1230 12/18/19 1340 12/18/19 1430  BP:  (!) 144/65 (!) 149/95 139/90  Pulse:  67 64 68  Resp:  (!) 24 19 13   Temp:      TempSrc:      SpO2:  99% 100% 99%  Weight: 81 kg     Height: 5' (1.524 m)        Vitals:   12/18/19 1229 12/18/19 1230 12/18/19 1340 12/18/19 1430  BP:  (!) 144/65 (!) 149/95 139/90  Pulse:  67 64 68    Resp:  (!) 24 19 13   Temp:      TempSrc:      SpO2:  99% 100% 99%  Weight: 81 kg     Height: 5' (1.524 m)      General:  WNWD very confused woman Eyes: PERRL, lids and conjunctivae normal ENMT: Mucous membranes are moist. Posterior pharynx clear of any exudate or lesions.Normal dentition.  Neck: normal, supple, no masses, no thyromegaly Respiratory: clear to auscultation bilaterally, no wheezing, no crackles. Normal respiratory effort. No accessory muscle use.  Cardiovascular: Regular rate and rhythm, no murmurs / rubs / gallops. No extremity edema. 2+ pedal pulses. No carotid bruits.  Abdomen: no tenderness, no masses palpated. No hepatosplenomegaly. Bowel sounds positive.  Musculoskeletal: no clubbing / cyanosis. No joint deformity upper and lower extremities. Good ROM, no contractures. Normal muscle tone.  Skin: no rashes, lesions, ulcers. No induration Neurologic: CN 2-12 grossly intact.  Strength 5/5 in all 4.  Psychiatric: Poor judgement and insight. Alert and oriented to person. Confused/paranoid mood.     Labs on Admission: I have personally reviewed following labs and imaging studies  CBC: Recent Labs  Lab 12/18/19 1241  WBC 8.6  NEUTROABS 7.2  HGB 10.9*  HCT 32.4*  MCV 84.2  PLT 178   Basic Metabolic Panel: Recent Labs  Lab 12/18/19 1241  NA 139  K 3.4*  CL 105  CO2 25  GLUCOSE 116*  BUN 18  CREATININE 1.02*  CALCIUM 10.4*   GFR: Estimated Creatinine Clearance: 45.6 mL/min (A) (by C-G formula based on SCr of 1.02 mg/dL (H)). Liver Function Tests: Recent Labs  Lab 12/18/19 1241  AST 14*  ALT 15  ALKPHOS 96  BILITOT 0.9  PROT 6.3*  ALBUMIN 3.2*   No results for input(s): LIPASE, AMYLASE in the last 168 hours. No results for input(s): AMMONIA in the last 168 hours. Coagulation Profile: No results for input(s): INR, PROTIME in the last 168 hours. Cardiac Enzymes: No results for input(s): CKTOTAL, CKMB, CKMBINDEX, TROPONINI in the last 168  hours. BNP (last 3 results) No results for input(s): PROBNP in the last 8760 hours. HbA1C: No results for input(s): HGBA1C in the last 72 hours. CBG: No results for input(s): GLUCAP in the last 168 hours. Lipid Profile: No results for input(s): CHOL, HDL, LDLCALC, TRIG, CHOLHDL, LDLDIRECT in the last 72 hours. Thyroid Function Tests: No results for input(s): TSH, T4TOTAL, FREET4, T3FREE, THYROIDAB in the last 72 hours. Anemia Panel: No results for input(s): VITAMINB12, FOLATE, FERRITIN, TIBC, IRON, RETICCTPCT in the last 72 hours. Urine analysis:    Component Value Date/Time   COLORURINE YELLOW (A)  12/18/2019 1252   APPEARANCEUR TURBID (A) 12/18/2019 1252   LABSPEC 1.013 12/18/2019 1252   PHURINE 5.0 12/18/2019 1252   GLUCOSEU NEGATIVE 12/18/2019 1252   HGBUR LARGE (A) 12/18/2019 1252   BILIRUBINUR NEGATIVE 12/18/2019 1252   BILIRUBINUR negative 09/10/2019 1218   KETONESUR 5 (A) 12/18/2019 1252   PROTEINUR 100 (A) 12/18/2019 1252   UROBILINOGEN 0.2 09/10/2019 1218   NITRITE NEGATIVE 12/18/2019 1252   LEUKOCYTESUR MODERATE (A) 12/18/2019 1252    Radiological Exams on Admission: DG Chest 1 View  Result Date: 12/18/2019 CLINICAL DATA:  Non smoker, no hx of disease per chart. Pt unable to give answers. AMS. EXAM: CHEST  1 VIEW COMPARISON:  01/27/2018 FINDINGS: Lungs are clear. Heart size and mediastinal contours are within normal limits. Aortic Atherosclerosis (ICD10-170.0). No effusion.  No pneumothorax. Visualized bones unremarkable. IMPRESSION: No acute cardiopulmonary disease. Electronically Signed   By: Corlis Leak M.D.   On: 12/18/2019 13:38   CT Head Wo Contrast  Result Date: 12/18/2019 CLINICAL DATA:  Confusion, head injury. EXAM: CT HEAD WITHOUT CONTRAST TECHNIQUE: Contiguous axial images were obtained from the base of the skull through the vertex without intravenous contrast. COMPARISON:  Jul 25, 2019. FINDINGS: Brain: Mild diffuse cortical atrophy is noted. Mild chronic  ischemic white matter disease is noted. No mass effect or midline shift is noted. Ventricular size is within normal limits. There is no evidence of mass lesion, hemorrhage or acute infarction. Vascular: No hyperdense vessel or unexpected calcification. Skull: Normal. Negative for fracture or focal lesion. Sinuses/Orbits: No acute finding. Other: None. IMPRESSION: Mild diffuse cortical atrophy. Mild chronic ischemic white matter disease. No acute intracranial abnormality seen. Electronically Signed   By: Lupita Raider M.D.   On: 12/18/2019 13:20   CT Renal Stone Study  Result Date: 12/18/2019 CLINICAL DATA:  Painful urination. EXAM: CT ABDOMEN AND PELVIS WITHOUT CONTRAST TECHNIQUE: Multidetector CT imaging of the abdomen and pelvis was performed following the standard protocol without IV contrast. COMPARISON:  September 24, 2019 FINDINGS: Lower chest: There is a very small pericardial effusion. Hepatobiliary: No focal liver abnormality is seen. No gallstones, gallbladder wall thickening, or biliary dilatation. Pancreas: Unremarkable. No pancreatic ductal dilatation or surrounding inflammatory changes. Spleen: Normal in size without focal abnormality. Adrenals/Urinary Tract: Mild to moderate severity diffuse bilateral adrenal gland enlargement is seen. Kidneys are normal in size, without focal lesions. A 1.3 cm renal stone is seen within the right renal pelvis this is unchanged in position when compared to the prior study mild to moderate severity right-sided hydronephrosis is also seen. An additional 2 mm nonobstructing renal stone is seen within the mid right kidney. A cluster of 2 mm, 4 mm and 5 mm nonobstructing renal stones are seen within the posterior aspect of the mid left kidney. Bladder is unremarkable. Stomach/Bowel: Stomach is within normal limits. Appendix appears normal. No evidence of bowel wall thickening, distention, or inflammatory changes. Noninflamed diverticula are seen throughout the large bowel.  Vascular/Lymphatic: There is moderate severity calcification of the abdominal aorta and bilateral common iliac arteries, without evidence of aneurysmal dilatation. No enlarged abdominal or pelvic lymph nodes. Reproductive: Status post hysterectomy. No adnexal masses. Other: No abdominal wall hernia or abnormality. No abdominopelvic ascites. Musculoskeletal: Multilevel degenerative changes seen throughout the lumbar spine IMPRESSION: 1. 1.3 cm renal stone within the right renal pelvis with mild to moderate severity right-sided hydronephrosis. 2. Bilateral nonobstructing renal stones. 3. Colonic diverticulosis. 4. Very small pericardial effusion. 5. Aortic atherosclerosis. Aortic Atherosclerosis (ICD10-I70.0). Electronically  Signed   By: Aram Candela M.D.   On: 12/18/2019 15:06    EKG: Independently reviewed. No EKG done in ED  Assessment/Plan Active Problems:   UTI (urinary tract infection)   Nephrolithiasis   Hydronephrosis of right kidney   Dementia with behavioral disturbance (HCC)   HTN (hypertension)   Major depression with psychotic features (HCC)   Mixed Alzheimer's and vascular dementia (HCC)   Gross hematuria   Hemorrhagic cystitis  (please populate well all problems here in Problem List. (For example, if patient is on BP meds at home and you resume or decide to hold them, it is a problem that needs to be her. Same for CAD, COPD, HLD and so on)   1. Urol -patient with non-obstructing stones right renal pelvis. Dr. Mena Goes has seen patient - no indication for intervention.  2. Hemorrhagic cystitis - no evidence of sepsis or upper tract infection. Has received 1 g rocephin Plan Change to oral abx  Gentle hydration  F/u CBC Bmet in AM  3. Dementia - per daughter patient may have missed many doses of meds adding to confusion. Plan Resume home meds  4. Dispo - have asked daughter to come sit with patient for safety. Patient should be able to return home 24 hrs.  DVT  prophylaxis: lovenox  Code Status: full code  Family Communication: spoke with dadughter Mrs. Hyacinth Meeker - she understands Dx and tx plan.She is willing to come stay with patient  Disposition Plan: home 24 hrs  Consults called: urology - Dr. Mena Goes  Admission status: obs    Illene Regulus MD [facility] Pager [phone]  If 7PM-7AM, please contact night-coverage [email or web address] Password [facility]  12/18/2019, 6:39 PM

## 2019-12-18 NOTE — ED Provider Notes (Signed)
[facility] Emergency Department Provider Note  ____________________________________________  Time seen: Approximately 12:36 PM  I have reviewed the triage vital signs and the nursing notes.   HISTORY  Chief Complaint Altered Mental Status    HPI Amanda Hancock is a 74 y.o. female with past medical history of hypertension, Alzheimer's, depression and presents to the emergency department for evaluation of altered mental status for 2 to 3 days.  Per EMS, family states that she is confused at baseline but has been more confused over the last couple of days and has been noncompliant with her medication.  This morning was the first time they were able to get her to take her medication.  They did notice some blood in her urine.  Patient has intermittently complained of throat, chest, abdominal pain.  She did have a fall several days ago that she was not evaluated after.  She sustained a bruise to her right hip from the fall.     Past Medical History:  Diagnosis Date  . Abnormal Pap smear    ASCUS  . Anxiety   . Chronic kidney disease    KIDNEYSTONES  . Depression   . Elevated blood pressure (not hypertension)   . Hypertension   . Vaginal Pap smear with ASC-US and positive HPV 12/20/2010  . Vaginal Pap smear, abnormal     Patient Active Problem List   Diagnosis Date Noted  . Nephrolithiasis 12/18/2019  . Hydronephrosis of right kidney 12/18/2019  . UTI (urinary tract infection) 12/18/2019  . Dementia with behavioral disturbance (HCC) 12/18/2019  . Hemorrhagic cystitis 12/18/2019  . Gross hematuria 09/10/2019  . Laceration of skin of forehead 08/02/2019  . Urinary frequency 06/03/2019  . 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 Major depression with psychotic features (HCC) 10/16/2016  . Preventative health care 10/11/2016  . Chest pain 11/03/2013  . Hypercalcemia 10/09/2013  . Vitamin D deficiency 10/09/2013  . Vaginal Pap smear with  LGSIL, positive HRHPV Type 16 03/16/2013  . HTN (hypertension) 01/28/2011    Past Surgical History:  Procedure Laterality Date  . ABDOMINAL HYSTERECTOMY    . TUBAL LIGATION      Prior to Admission medications   Medication Sig Start Date End Date Taking? Authorizing Provider  amLODipine (NORVASC) 10 MG tablet Take 1 tablet by mouth once daily for blood pressure Patient taking differently: Take 10 mg by mouth at bedtime.  09/23/19  Yes Doreene Nest, NP  aspirin 81 MG tablet Take 81 mg by mouth daily.   Yes [provider]  Cyanocobalamin (B-12 PO) Take 2,000 mcg by mouth daily.   Yes [provider]  donepezil (ARICEPT) 5 MG tablet Take 5 mg by mouth at bedtime.   Yes [provider]  Ibuprofen-diphenhydrAMINE HCl 200-25 MG CAPS Take 1 tablet by mouth at bedtime.   Yes [provider]  losartan (COZAAR) 50 MG tablet Take 1 tablet by mouth once daily for blood pressure Patient taking differently: Take 50 mg by mouth daily.  09/23/19  Yes Doreene Nest, NP  melatonin 3 MG TABS tablet Take 3 mg by mouth at bedtime.   Yes [provider]  Multiple Vitamins-Minerals (MULTIVITAMIN ADULT PO) Take 1 tablet by mouth daily.    Yes [provider]  QUEtiapine (SEROQUEL) 50 MG tablet Take 50 mg by mouth at bedtime.    Yes [provider]  sertraline (ZOLOFT) 100 MG tablet TAKE 1 TABLET BY MOUTH ONCE DAILY Patient  taking differently: Take 100 mg by mouth daily.  06/25/16  Yes Dianne Dun, MD    Allergies Codeine, Ace inhibitors, and Hctz [hydrochlorothiazide]  Family History  Problem Relation Age of Onset  . Parkinsonism Mother   . Dementia Mother   . Heart attack Paternal Grandfather   . Diabetes Maternal Grandmother   . Heart attack Father   . Dementia Cousin   . Breast cancer Maternal Aunt   . Breast cancer Paternal Aunt     Social History Social History   Tobacco Use  . Smoking status: Never Smoker  . Smokeless  tobacco: Never Used  Substance Use Topics  . Alcohol use: No  . Drug use: No     Review of Systems  Constitutional: No fever/chills ENT: No upper respiratory complaints. Cardiovascular: No chest pain. Respiratory: No cough. No SOB. Gastrointestinal: Positivee for abdominal pain.  No nausea, no vomiting.  Genitourinary: Negative for dysuria. Positive for hematuria. Musculoskeletal: Negative for musculoskeletal pain. Skin: Negative for rash, abrasions, lacerations, ecchymosis. Neurological: Negative for headaches   ____________________________________________   PHYSICAL EXAM:  VITAL SIGNS: ED Triage Vitals  Enc Vitals Group     BP 12/18/19 1228 (!) 152/99     Pulse Rate 12/18/19 1228 70     Resp 12/18/19 1228 18     Temp 12/18/19 1228 98.7 F (37.1 C)     Temp Source 12/18/19 1228 Oral     SpO2 12/18/19 1228 100 %     Weight 12/18/19 1229 178 lb 9.2 oz (81 kg)     Height 12/18/19 1229 5' (1.524 m)     Head Circumference --      Peak Flow --      Pain Score 12/18/19 1228 0     Pain Loc --      Pain Edu? --      Excl. in GC? --      Constitutional: Alert. Well appearing and in no acute distress. Eyes: Conjunctivae are normal. PERRL. EOMI. Head: Atraumatic. ENT:      Ears:      Nose: No congestion/rhinnorhea.      Mouth/Throat: Mucous membranes are moist.  Neck: No stridor. No cervical spine tenderness to palpation. Cardiovascular: Normal rate, regular rhythm.  Good peripheral circulation. Respiratory: Normal respiratory effort without tachypnea or retractions. Lungs CTAB. Good air entry to the bases with no decreased or absent breath sounds. Gastrointestinal: Bowel sounds 4 quadrants. Soft and nontender to palpation. No guarding or rigidity. No palpable masses. No distention. No CVA tenderness. Musculoskeletal: Full range of motion to all extremities. No gross deformities appreciated. Neurologic:  Normal speech and language. No gross focal neurologic deficits are  appreciated.  Skin:  Skin is warm, dry and intact. No rash noted.   ____________________________________________   LABS (all labs ordered are listed, but only abnormal results are displayed)  Labs Reviewed  URINALYSIS, COMPLETE (UACMP) WITH MICROSCOPIC - Abnormal; Notable for the following components:      Result Value   Color, Urine YELLOW (*)    APPearance TURBID (*)    Hgb urine dipstick LARGE (*)    Ketones, ur 5 (*)    Protein, ur 100 (*)    Leukocytes,Ua MODERATE (*)    RBC / HPF >50 (*)    WBC, UA >50 (*)    Bacteria, UA MANY (*)    All other components within normal limits  CBC WITH DIFFERENTIAL/PLATELET - Abnormal; Notable for the following components:   RBC 3.85 (*)  Hemoglobin 10.9 (*)    HCT 32.4 (*)    All other components within normal limits  COMPREHENSIVE METABOLIC PANEL - Abnormal; Notable for the following components:   Potassium 3.4 (*)    Glucose, Bld 116 (*)    Creatinine, Ser 1.02 (*)    Calcium 10.4 (*)    Total Protein 6.3 (*)    Albumin 3.2 (*)    AST 14 (*)    GFR calc non Af Amer 54 (*)    All other components within normal limits  RESPIRATORY PANEL BY RT PCR (FLU A&B, COVID)  CULTURE, BLOOD (ROUTINE X 2)  CULTURE, BLOOD (ROUTINE X 2)  URINE CULTURE  LACTIC ACID, PLASMA  LACTIC ACID, PLASMA  BASIC METABOLIC PANEL  CBC  TROPONIN I (HIGH SENSITIVITY)  TROPONIN I (HIGH SENSITIVITY)   ____________________________________________  EKG   ____________________________________________  RADIOLOGY Lexine BatonI, Latonia Conrow, personally viewed and evaluated these images (plain radiographs) as part of my medical decision making, as well as reviewing the written report by the radiologist.  DG Chest 1 View  Result Date: 12/18/2019 CLINICAL DATA:  Non smoker, no hx of disease per chart. Pt unable to give answers. AMS. EXAM: CHEST  1 VIEW COMPARISON:  01/27/2018 FINDINGS: Lungs are clear. Heart size and mediastinal contours are within normal limits.  Aortic Atherosclerosis (ICD10-170.0). No effusion.  No pneumothorax. Visualized bones unremarkable. IMPRESSION: No acute cardiopulmonary disease. Electronically Signed   By: Corlis Leak  Hassell M.D.   On: 12/18/2019 13:38   CT Head Wo Contrast  Result Date: 12/18/2019 CLINICAL DATA:  Confusion, head injury. EXAM: CT HEAD WITHOUT CONTRAST TECHNIQUE: Contiguous axial images were obtained from the base of the skull through the vertex without intravenous contrast. COMPARISON:  Jul 25, 2019. FINDINGS: Brain: Mild diffuse cortical atrophy is noted. Mild chronic ischemic white matter disease is noted. No mass effect or midline shift is noted. Ventricular size is within normal limits. There is no evidence of mass lesion, hemorrhage or acute infarction. Vascular: No hyperdense vessel or unexpected calcification. Skull: Normal. Negative for fracture or focal lesion. Sinuses/Orbits: No acute finding. Other: None. IMPRESSION: Mild diffuse cortical atrophy. Mild chronic ischemic white matter disease. No acute intracranial abnormality seen. Electronically Signed   By: Lupita RaiderJames  Green Jr M.D.   On: 12/18/2019 13:20   CT Renal Stone Study  Result Date: 12/18/2019 CLINICAL DATA:  Painful urination. EXAM: CT ABDOMEN AND PELVIS WITHOUT CONTRAST TECHNIQUE: Multidetector CT imaging of the abdomen and pelvis was performed following the standard protocol without IV contrast. COMPARISON:  September 24, 2019 FINDINGS: Lower chest: There is a very small pericardial effusion. Hepatobiliary: No focal liver abnormality is seen. No gallstones, gallbladder wall thickening, or biliary dilatation. Pancreas: Unremarkable. No pancreatic ductal dilatation or surrounding inflammatory changes. Spleen: Normal in size without focal abnormality. Adrenals/Urinary Tract: Mild to moderate severity diffuse bilateral adrenal gland enlargement is seen. Kidneys are normal in size, without focal lesions. A 1.3 cm renal stone is seen within the right renal pelvis this is  unchanged in position when compared to the prior study mild to moderate severity right-sided hydronephrosis is also seen. An additional 2 mm nonobstructing renal stone is seen within the mid right kidney. A cluster of 2 mm, 4 mm and 5 mm nonobstructing renal stones are seen within the posterior aspect of the mid left kidney. Bladder is unremarkable. Stomach/Bowel: Stomach is within normal limits. Appendix appears normal. No evidence of bowel wall thickening, distention, or inflammatory changes. Noninflamed diverticula are seen throughout the  large bowel. Vascular/Lymphatic: There is moderate severity calcification of the abdominal aorta and bilateral common iliac arteries, without evidence of aneurysmal dilatation. No enlarged abdominal or pelvic lymph nodes. Reproductive: Status post hysterectomy. No adnexal masses. Other: No abdominal wall hernia or abnormality. No abdominopelvic ascites. Musculoskeletal: Multilevel degenerative changes seen throughout the lumbar spine IMPRESSION: 1. 1.3 cm renal stone within the right renal pelvis with mild to moderate severity right-sided hydronephrosis. 2. Bilateral nonobstructing renal stones. 3. Colonic diverticulosis. 4. Very small pericardial effusion. 5. Aortic atherosclerosis. Aortic Atherosclerosis (ICD10-I70.0). Electronically Signed   By: Aram Candela M.D.   On: 12/18/2019 15:06    ____________________________________________    PROCEDURES  Procedure(s) performed:    Procedures    ____________________________________________   INITIAL IMPRESSION / ASSESSMENT AND PLAN / ED COURSE  Pertinent labs & imaging results that were available during my care of the patient were reviewed by me and considered in my medical decision making (see chart for details).  Review of the [place] CSRS was performed in accordance of the [facility] prior to dispensing any controlled drugs.  Differential diagnosis includes, but is not limited to, alcohol, illicit or  prescription medications, or other toxic ingestion; intracranial pathology such as stroke or intracerebral hemorrhage; fever or infectious causes including sepsis; hypoxemia and/or hypercarbia; uremia; trauma; endocrine related disorders such as diabetes, hypoglycemia, and thyroid-related diseases; hypertensive encephalopathy; etc.   Patient presented to emergency department for evaluation of altered mental status for 3 days.  Vital signs and exam are reassuring.  No increased lactic acid or leukocytosis to indicate sepsis.  Urinalysis contributory to infection.  CT renal stone study shows a 1.3 cm right-sided renal stone with mild to moderate nephrolithiasis.  CT head is negative for acute intercranial abnormality.  Chest x-ray negative for pneumonia or acute cardiopulmonary processes.  Covid and influenza tests are negative.  Patient was started on IV Rocephin and IV fluids for urinary tract infection.  Since patient has had altered mental status, she will be admitted to the hospitalist service for urinary tract infection and obstructing nephrolithiasis.  Dr. Mena Goes with urology is in agreement with plan.  Patient was admitted to the hospitalist service.    Amanda Hancock was evaluated in Emergency Department on 12/18/2019 for the symptoms described in the history of present illness. She was evaluated in the context of the global COVID-19 pandemic, which necessitated consideration that the patient might be at risk for infection with the SARS-CoV-2 virus that causes COVID-19. Institutional protocols and algorithms that pertain to the evaluation of patients at risk for COVID-19 are in a state of rapid change based on information released by regulatory bodies including the CDC and federal and state organizations. These policies and algorithms were followed during the patient's care in the ED.  ____________________________________________  FINAL CLINICAL IMPRESSION(S) / ED DIAGNOSES  Final diagnoses:   Acute cystitis with hematuria  Nephrolithiasis  Altered mental status, unspecified altered mental status type      NEW MEDICATIONS STARTED DURING THIS VISIT:  ED Discharge Orders    None          This chart was dictated using voice recognition software/Dragon. Despite best efforts to proofread, errors can occur which can change the meaning. Any change was purely unintentional.    Enid Derry, PA-C 12/19/19 2004    Delton Prairie, MD 12/20/19 [phone]

## 2019-12-18 NOTE — Consult Note (Signed)
Full consult to follow - chronic 22mm right renal pelvic stone. Minimal hydro. UA c/w UTI (report of dysuria) but it does not appear the upper tract is infected (no fever, normal wbc, LA and vitals). I suspect this is likely more of a UTI/cystitis event and a stent or perc tube would likely add undue morbidity. She does not need urgency urological intervention. However, she would benefit from outpatient f/u to consider stone treatment.

## 2019-12-18 NOTE — ED Notes (Signed)
Pt came off the bed and accidentally pulled off the IV

## 2019-12-18 NOTE — ED Notes (Signed)
Patient transported to CT 

## 2019-12-18 NOTE — ED Triage Notes (Signed)
Patient BIBA from home. Patients family sent patient to ER for increased confusion. Hx of dementia and non compliance with medication. Patient has made complaints of pain with urination and pain with swallowing, patient poor historian. Patient A&Ox1.

## 2019-12-19 ENCOUNTER — Observation Stay: Payer: PPO

## 2019-12-19 DIAGNOSIS — I10 Essential (primary) hypertension: Secondary | ICD-10-CM | POA: Diagnosis present

## 2019-12-19 DIAGNOSIS — S7001XA Contusion of right hip, initial encounter: Secondary | ICD-10-CM | POA: Diagnosis present

## 2019-12-19 DIAGNOSIS — N3091 Cystitis, unspecified with hematuria: Secondary | ICD-10-CM

## 2019-12-19 DIAGNOSIS — Z7982 Long term (current) use of aspirin: Secondary | ICD-10-CM | POA: Diagnosis not present

## 2019-12-19 DIAGNOSIS — N1330 Unspecified hydronephrosis: Secondary | ICD-10-CM

## 2019-12-19 DIAGNOSIS — N200 Calculus of kidney: Secondary | ICD-10-CM

## 2019-12-19 DIAGNOSIS — Z885 Allergy status to narcotic agent status: Secondary | ICD-10-CM | POA: Diagnosis not present

## 2019-12-19 DIAGNOSIS — B9620 Unspecified Escherichia coli [E. coli] as the cause of diseases classified elsewhere: Secondary | ICD-10-CM | POA: Diagnosis present

## 2019-12-19 DIAGNOSIS — Z515 Encounter for palliative care: Secondary | ICD-10-CM | POA: Diagnosis not present

## 2019-12-19 DIAGNOSIS — Z9114 Patient's other noncompliance with medication regimen: Secondary | ICD-10-CM | POA: Diagnosis not present

## 2019-12-19 DIAGNOSIS — M25552 Pain in left hip: Secondary | ICD-10-CM | POA: Diagnosis not present

## 2019-12-19 DIAGNOSIS — Z6834 Body mass index (BMI) 34.0-34.9, adult: Secondary | ICD-10-CM | POA: Diagnosis not present

## 2019-12-19 DIAGNOSIS — Z79899 Other long term (current) drug therapy: Secondary | ICD-10-CM | POA: Diagnosis not present

## 2019-12-19 DIAGNOSIS — Z888 Allergy status to other drugs, medicaments and biological substances status: Secondary | ICD-10-CM | POA: Diagnosis not present

## 2019-12-19 DIAGNOSIS — F0281 Dementia in other diseases classified elsewhere with behavioral disturbance: Secondary | ICD-10-CM | POA: Diagnosis present

## 2019-12-19 DIAGNOSIS — M542 Cervicalgia: Secondary | ICD-10-CM | POA: Diagnosis present

## 2019-12-19 DIAGNOSIS — R310 Gross hematuria: Secondary | ICD-10-CM | POA: Diagnosis not present

## 2019-12-19 DIAGNOSIS — Z20822 Contact with and (suspected) exposure to covid-19: Secondary | ICD-10-CM | POA: Diagnosis present

## 2019-12-19 DIAGNOSIS — Z9851 Tubal ligation status: Secondary | ICD-10-CM | POA: Diagnosis not present

## 2019-12-19 DIAGNOSIS — F323 Major depressive disorder, single episode, severe with psychotic features: Secondary | ICD-10-CM | POA: Diagnosis present

## 2019-12-19 DIAGNOSIS — N3001 Acute cystitis with hematuria: Secondary | ICD-10-CM | POA: Diagnosis not present

## 2019-12-19 DIAGNOSIS — Z66 Do not resuscitate: Secondary | ICD-10-CM | POA: Diagnosis present

## 2019-12-19 DIAGNOSIS — G3183 Dementia with Lewy bodies: Secondary | ICD-10-CM | POA: Diagnosis present

## 2019-12-19 DIAGNOSIS — E669 Obesity, unspecified: Secondary | ICD-10-CM | POA: Diagnosis present

## 2019-12-19 DIAGNOSIS — Z90710 Acquired absence of both cervix and uterus: Secondary | ICD-10-CM | POA: Diagnosis not present

## 2019-12-19 DIAGNOSIS — G309 Alzheimer's disease, unspecified: Secondary | ICD-10-CM | POA: Diagnosis present

## 2019-12-19 DIAGNOSIS — Z7189 Other specified counseling: Secondary | ICD-10-CM | POA: Diagnosis not present

## 2019-12-19 DIAGNOSIS — Z8249 Family history of ischemic heart disease and other diseases of the circulatory system: Secondary | ICD-10-CM | POA: Diagnosis not present

## 2019-12-19 DIAGNOSIS — W19XXXA Unspecified fall, initial encounter: Secondary | ICD-10-CM | POA: Diagnosis present

## 2019-12-19 DIAGNOSIS — N136 Pyonephrosis: Secondary | ICD-10-CM | POA: Diagnosis present

## 2019-12-19 DIAGNOSIS — F419 Anxiety disorder, unspecified: Secondary | ICD-10-CM | POA: Diagnosis present

## 2019-12-19 DIAGNOSIS — F0151 Vascular dementia with behavioral disturbance: Secondary | ICD-10-CM | POA: Diagnosis present

## 2019-12-19 LAB — BASIC METABOLIC PANEL
Anion gap: 6 (ref 5–15)
BUN: 17 mg/dL (ref 8–23)
CO2: 26 mmol/L (ref 22–32)
Calcium: 9.5 mg/dL (ref 8.9–10.3)
Chloride: 109 mmol/L (ref 98–111)
Creatinine, Ser: 0.95 mg/dL (ref 0.44–1.00)
GFR calc Af Amer: >60 mL/min (ref >60)
GFR calc non Af Amer: 59 mL/min — ABNORMAL LOW (ref >60)
Glucose, Bld: 112 mg/dL — ABNORMAL HIGH (ref 70–99)
Potassium: 3.8 mmol/L (ref 3.5–5.1)
Sodium: 141 mmol/L (ref 135–145)

## 2019-12-19 LAB — CBC
HCT: 29 % — ABNORMAL LOW (ref 36.0–46.0)
Hemoglobin: 9.7 g/dL — ABNORMAL LOW (ref 12.0–15.0)
MCH: 28.6 pg (ref 26.0–34.0)
MCHC: 33.4 g/dL (ref 30.0–36.0)
MCV: 85.5 fL (ref 80.0–100.0)
Platelets: 157 10*3/uL (ref 150–400)
RBC: 3.39 MIL/uL — ABNORMAL LOW (ref 3.87–5.11)
RDW: 13.1 % (ref 11.5–15.5)
WBC: 8.9 10*3/uL (ref 4.0–10.5)
nRBC: 0 % (ref 0.0–0.2)

## 2019-12-19 MED ORDER — HYDRALAZINE HCL 20 MG/ML IJ SOLN
10.0000 mg | Freq: Four times a day (QID) | INTRAMUSCULAR | Status: DC | PRN
  Administered 2019-12-22: 10 mg via INTRAVENOUS
  Filled 2019-12-19: qty 1

## 2019-12-19 MED ORDER — SODIUM CHLORIDE 0.9 % IV SOLN: INTRAVENOUS | Status: DC

## 2019-12-19 MED ORDER — PANTOPRAZOLE SODIUM 40 MG IV SOLR
40.0000 mg | Freq: Once | INTRAVENOUS | Status: AC
  Administered 2019-12-19: 40 mg via INTRAVENOUS
  Filled 2019-12-19: qty 40

## 2019-12-19 MED ORDER — SODIUM CHLORIDE 0.9 % IV SOLN
1.0000 g | INTRAVENOUS | Status: AC
  Administered 2019-12-19 – 2019-12-22 (×4): 1 g via INTRAVENOUS
  Filled 2019-12-19: qty 1
  Filled 2019-12-19 (×3): qty 10

## 2019-12-19 MED ORDER — ONDANSETRON HCL 4 MG/2ML IJ SOLN
4.0000 mg | Freq: Three times a day (TID) | INTRAMUSCULAR | Status: DC | PRN
  Administered 2019-12-19: 4 mg via INTRAVENOUS
  Filled 2019-12-19: qty 2

## 2019-12-19 NOTE — ED Notes (Signed)
Chaplain at bedside to discuss POA with daughter.

## 2019-12-19 NOTE — ED Notes (Signed)
Report given to Renee RN.

## 2019-12-19 NOTE — Consult Note (Addendum)
Consult: Right renal pelvic stone, UTI Requested by: Dr. Delton Prairie  History of Present Illness: Amanda Hancock is a 74 year old female with a history of dementia.  She was more confused and family called EMS.  Family states she has been more confused the last few days and has not been taking her medicines.  Daughter here this morning and contributing to history.  Patient seen and examined on rounds this morning.  She notes her mom has not had a bowel movement in about 5 days.  The patient is complaining of pain.  She points to her lower abdominal area and vaginal area.  Daughter reports they knew about the kidney stones but were told they were small enough to pass.  Patient is eating this morning and had no nausea or vomiting.  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  I thought there was minimal hydronephrosis.  The stone is easily visible on the scout and skin to stone distance is about 9 cm.  Hounsfield units were about 300, but there is motion artifact.  Comparing back to her July 2021 CT scan the stone is stable and Hounsfield units were measuring about 1500. She underwent x-rays of her hip and lower abdomen and the stone was also noted on plain x-ray in the right renal pelvis.  Her white count was 8.6 and now 8.9.  Afebrile and stable vitals.  No tachycardia.  Creatinine was 1.02 and now 0.95.    Past Medical History:  Diagnosis Date  . Abnormal Pap smear    ASCUS  . Anxiety   . Chronic kidney disease    KIDNEYSTONES  . Depression   . Elevated blood pressure (not hypertension)   . Hypertension   . Vaginal Pap smear with ASC-US and positive HPV 12/20/2010  . Vaginal Pap smear, abnormal    Past Surgical History:  Procedure Laterality Date  . ABDOMINAL HYSTERECTOMY    . TUBAL LIGATION      Home Medications:  (Not in a hospital admission)  Allergies:  Allergies  Allergen Reactions  . Codeine Nausea And Vomiting  and Other (See Comments)    headache  . Ace Inhibitors Cough  . Hctz [Hydrochlorothiazide]     Rash     Family History  Problem Relation Age of Onset  . Parkinsonism Mother   . Dementia Mother   . Heart attack Paternal Grandfather   . Diabetes Maternal Grandmother   . Heart attack Father   . Dementia Cousin   . Breast cancer Maternal Aunt   . Breast cancer Paternal Aunt    Social History:  reports that she has never smoked. She has never used smokeless tobacco. She reports that she does not drink alcohol and does not use drugs.  ROS: A complete review of systems was performed.  All systems are negative except for pertinent findings as noted. Review of Systems  Unable to perform ROS: Dementia  Gastrointestinal: Positive for abdominal pain.     Physical Exam:  Vital signs in last 24 hours: Temp:  [98.6 F (37 C)-98.7 F (37.1 C)] 98.6 F (37 C) (10/03 0800) Pulse Rate:  [42-76] 76 (10/03 0900) Resp:  [11-24] 16 (10/03 0900) BP: (119-152)/(60-114) 139/75 (10/03 0900) SpO2:  [92 %-100 %] 96 % (10/03 0900) Weight:  [81 kg] 81 kg (10/02 1229) General:  Alert and confused, No acute distress, pleasant HEENT: Normocephalic, atraumatic Cardiovascular: Regular rate and rhythm Lungs: Regular rate  and effort Abdomen: Soft, nontender, nondistended, no abdominal masses Back: No CVA tenderness Extremities: No edema Neurologic: Grossly intact GU: Severe vaginal atrophy otherwise normal vulva without mass.  No visible prolapse.  No palpable prolapse in urethra and meatus palpably normal.  Amber chaperone for physical exam.  Laboratory Data:  Results for orders placed or performed during the hospital encounter of 12/18/19 (from the past 24 hour(s))  CBC with Differential     Status: Abnormal   Collection Time: 12/18/19 12:41 PM  Result Value Ref Range   WBC 8.6 4.0 - 10.5 K/uL   RBC 3.85 (L) 3.87 - 5.11 MIL/uL   Hemoglobin 10.9 (L) 12.0 - 15.0 g/dL   HCT 16.132.4 (L) 36 - 46 %    MCV 84.2 80.0 - 100.0 fL   MCH 28.3 26.0 - 34.0 pg   MCHC 33.6 30.0 - 36.0 g/dL   RDW 09.613.1 04.511.5 - 40.915.5 %   Platelets 178 150 - 400 K/uL   nRBC 0.2 0.0 - 0.2 %   Neutrophils Relative % 85 %   Neutro Abs 7.2 1.7 - 7.7 K/uL   Lymphocytes Relative 9 %   Lymphs Abs 0.8 0.7 - 4.0 K/uL   Monocytes Relative 6 %   Monocytes Absolute 0.6 0 - 1 K/uL   Eosinophils Relative 0 %   Eosinophils Absolute 0.0 0 - 0 K/uL   Basophils Relative 0 %   Basophils Absolute 0.0 0 - 0 K/uL   Immature Granulocytes 0 %   Abs Immature Granulocytes 0.03 0.00 - 0.07 K/uL  Comprehensive metabolic panel     Status: Abnormal   Collection Time: 12/18/19 12:41 PM  Result Value Ref Range   Sodium 139 135 - 145 mmol/L   Potassium 3.4 (L) 3.5 - 5.1 mmol/L   Chloride 105 98 - 111 mmol/L   CO2 25 22 - 32 mmol/L   Glucose, Bld 116 (H) 70 - 99 mg/dL   BUN 18 8 - 23 mg/dL   Creatinine, Ser 8.111.02 (H) 0.44 - 1.00 mg/dL   Calcium 91.410.4 (H) 8.9 - 10.3 mg/dL   Total Protein 6.3 (L) 6.5 - 8.1 g/dL   Albumin 3.2 (L) 3.5 - 5.0 g/dL   AST 14 (L) 15 - 41 U/L   ALT 15 0 - 44 U/L   Alkaline Phosphatase 96 38 - 126 U/L   Total Bilirubin 0.9 0.3 - 1.2 mg/dL   GFR calc non Af Amer 54 (L) >60 mL/min   GFR calc Af Amer >60 >60 mL/min   Anion gap 9 5 - 15  Troponin I (High Sensitivity)     Status: None   Collection Time: 12/18/19 12:41 PM  Result Value Ref Range   Troponin I (High Sensitivity) 13 <18 ng/L  Urinalysis, Complete w Microscopic Urine, Clean Catch     Status: Abnormal   Collection Time: 12/18/19 12:52 PM  Result Value Ref Range   Color, Urine YELLOW (A) YELLOW   APPearance TURBID (A) CLEAR   Specific Gravity, Urine 1.013 1.005 - 1.030   pH 5.0 5.0 - 8.0   Glucose, UA NEGATIVE NEGATIVE mg/dL   Hgb urine dipstick LARGE (A) NEGATIVE   Bilirubin Urine NEGATIVE NEGATIVE   Ketones, ur 5 (A) NEGATIVE mg/dL   Protein, ur 782100 (A) NEGATIVE mg/dL   Nitrite NEGATIVE NEGATIVE   Leukocytes,Ua MODERATE (A) NEGATIVE   RBC / HPF  >50 (H) 0 - 5 RBC/hpf   WBC, UA >50 (H) 0 - 5 WBC/hpf  Bacteria, UA MANY (A) NONE SEEN   Squamous Epithelial / LPF 11-20 0 - 5   Mucus PRESENT   Lactic acid, plasma     Status: None   Collection Time: 12/18/19 12:52 PM  Result Value Ref Range   Lactic Acid, Venous 1.3 0.5 - 1.9 mmol/L  Blood culture (routine x 2)     Status: None (Preliminary result)   Collection Time: 12/18/19 12:52 PM   Specimen: BLOOD  Result Value Ref Range   Specimen Description BLOOD LAC    Special Requests      BOTTLES DRAWN AEROBIC AND ANAEROBIC Blood Culture adequate volume   Culture      NO GROWTH < 24 HOURS Performed at [facility], [address] [place], [place] [address]    Report Status PENDING   Respiratory Panel by RT PCR (Flu A&B, Covid) - Urine, Clean Catch     Status: None   Collection Time: 12/18/19 12:52 PM   Specimen: Urine, Clean Catch  Result Value Ref Range   SARS Coronavirus 2 by RT PCR NEGATIVE NEGATIVE   Influenza A by PCR NEGATIVE NEGATIVE   Influenza B by PCR NEGATIVE NEGATIVE  Blood culture (routine x 2)     Status: None (Preliminary result)   Collection Time: 12/18/19 12:53 PM   Specimen: BLOOD  Result Value Ref Range   Specimen Description BLOOD RIGHT HAND    Special Requests      BOTTLES DRAWN AEROBIC AND ANAEROBIC Blood Culture adequate volume   Culture      NO GROWTH < 24 HOURS Performed at [facility], [address] [place], [place] [address]    Report Status PENDING   Troponin I (High Sensitivity)     Status: None   Collection Time: 12/18/19  2:32 PM  Result Value Ref Range   Troponin I (High Sensitivity) 13 <18 ng/L  Basic metabolic panel     Status: Abnormal   Collection Time: 12/19/19  3:51 AM  Result Value Ref Range   Sodium 141 135 - 145 mmol/L   Potassium 3.8 3.5 - 5.1 mmol/L   Chloride 109 98 - 111 mmol/L   CO2 26 22 - 32 mmol/L   Glucose, Bld 112 (H) 70 - 99 mg/dL   BUN 17 8 - 23 mg/dL   Creatinine, Ser 3.32 0.44 - 1.00 mg/dL    Calcium 9.5 8.9 - 95.1 mg/dL   GFR calc non Af Amer 59 (L) >60 mL/min   GFR calc Af Amer >60 >60 mL/min   Anion gap 6 5 - 15  CBC     Status: Abnormal   Collection Time: 12/19/19  3:51 AM  Result Value Ref Range   WBC 8.9 4.0 - 10.5 K/uL   RBC 3.39 (L) 3.87 - 5.11 MIL/uL   Hemoglobin 9.7 (L) 12.0 - 15.0 g/dL   HCT 88.4 (L) 36 - 46 %   MCV 85.5 80.0 - 100.0 fL   MCH 28.6 26.0 - 34.0 pg   MCHC 33.4 30.0 - 36.0 g/dL   RDW 16.6 06.3 - 01.6 %   Platelets 157 150 - 400 K/uL   nRBC 0.0 0.0 - 0.2 %   Recent Results (from the past 240 hour(s))  Blood culture (routine x 2)     Status: None (Preliminary result)   Collection Time: 12/18/19 12:52 PM   Specimen: BLOOD  Result Value Ref Range Status   Specimen Description BLOOD LAC  Final   Special Requests  Final    BOTTLES DRAWN AEROBIC AND ANAEROBIC Blood Culture adequate volume   Culture   Final    NO GROWTH < 24 HOURS Performed at [facility], [address] [place], [place] [address]    Report Status PENDING  Incomplete  Respiratory Panel by RT PCR (Flu A&B, Covid) - Urine, Clean Catch     Status: None   Collection Time: 12/18/19 12:52 PM   Specimen: Urine, Clean Catch  Result Value Ref Range Status   SARS Coronavirus 2 by RT PCR NEGATIVE NEGATIVE Final    Comment: (NOTE) SARS-CoV-2 target nucleic acids are NOT DETECTED.  The SARS-CoV-2 RNA is generally detectable in upper respiratoy specimens during the acute phase of infection. The lowest concentration of SARS-CoV-2 viral copies this assay can detect is 131 copies/mL. A negative result does not preclude SARS-Cov-2 infection and should not be used as the sole basis for treatment or other patient management decisions. A negative result may occur with  improper specimen collection/handling, submission of specimen other than nasopharyngeal swab, presence of viral mutation(s) within the areas targeted by this assay, and inadequate number of viral copies (<131  copies/mL). A negative result must be combined with clinical observations, patient history, and epidemiological information. The expected result is Negative.  Fact Sheet for Patients:  https://www.moore.com/  Fact Sheet for Healthcare Providers:  https://www.young.biz/  This test is no t yet approved or cleared by the [place] FDA and  has been authorized for detection and/or diagnosis of SARS-CoV-2 by FDA under an Emergency Use Authorization (EUA). This EUA will remain  in effect (meaning this test can be used) for the duration of the COVID-19 declaration under Section 564(b)(1) of the Act, 21 U.S.C. section 360bbb-3(b)(1), unless the authorization is terminated or revoked sooner.     Influenza A by PCR NEGATIVE NEGATIVE Final   Influenza B by PCR NEGATIVE NEGATIVE Final    Comment: (NOTE) The Xpert Xpress SARS-CoV-2/FLU/RSV assay is intended as an aid in  the diagnosis of influenza from Nasopharyngeal swab specimens and  should not be used as a sole basis for treatment. Nasal washings and  aspirates are unacceptable for Xpert Xpress SARS-CoV-2/FLU/RSV  testing.  Fact Sheet for Patients: https://www.moore.com/  Fact Sheet for Healthcare Providers: https://www.young.biz/  This test is not yet approved or cleared by the [place] FDA and  has been authorized for detection and/or diagnosis of SARS-CoV-2 by  FDA under an Emergency Use Authorization (EUA). This EUA will remain  in effect (meaning this test can be used) for the duration of the  Covid-19 declaration under Section 564(b)(1) of the Act, 21  U.S.C. section 360bbb-3(b)(1), unless the authorization is  terminated or revoked. Performed at [facility], [address] [place], [place] [address]   Blood culture (routine x 2)     Status: None (Preliminary result)   Collection Time: 12/18/19 12:53 PM   Specimen: BLOOD   Result Value Ref Range Status   Specimen Description BLOOD RIGHT HAND  Final   Special Requests   Final    BOTTLES DRAWN AEROBIC AND ANAEROBIC Blood Culture adequate volume   Culture   Final    NO GROWTH < 24 HOURS Performed at [facility], [address] [place], [place] [address]    Report Status PENDING  Incomplete   Creatinine: Recent Labs    12/18/19 1241 12/19/19 0351  CREATININE 1.02* 0.95    Impression/Assessment/plan:  Right renal pelvic stone-I discussed with the patient and  her daughter the CT findings.  We discussed the stone is likely too large to pass and may be getting larger over time.  I would recommend treatment but she does not appear to need any urgent intervention.  Stent would introduce potentially more discomfort and risk of upper tract infection.  We went over the nature risk and benefits of ureteroscopy versus shockwave lithotripsy and they will consider.  I will see them in the outpatient to plan therapy.  Follow-up information on chart.  Urinary tract infection/cystitis-this appears to be localized to the bladder.  No worrisome findings on exam or imaging.  Labs, vital stable.  Culture pending.  I will sign off but please page GU with any questions, concerns or changes in patient status.  Jerilee Field 12/19/2019, 10:02 AM

## 2019-12-19 NOTE — ED Notes (Signed)
Pt sleeping. 

## 2019-12-19 NOTE — ED Notes (Signed)
Patient's family member called for assistance. States patient is hallucinating. Patient has dementia, but is oriented to self. Patient has legs bent at knee. Chux and brief and Purewick are still in place.

## 2019-12-19 NOTE — ED Notes (Signed)
Patient's brief was changed. It was soaked with urine. Patient was slightly agitated during change. Purewick is in place.

## 2019-12-19 NOTE — ED Notes (Signed)
Pt given meal tray.

## 2019-12-19 NOTE — Progress Notes (Signed)
PROGRESS NOTE    Amanda Hancock  CHY:[id number] DOB: 1945-06-12 DOA: 12/18/2019 PCP: Doreene Nest, NP    Brief Narrative:  Amanda Hancock is a 74 y.o. female with medical history significant of dementia, HTN, nephrolithiasis presented to [facility] due to increased confusion from baseline and recurrent hematuria. She has known non-obstructing nephrolithiasis and prior episodes of hematuria due to cystitis.   10/3-daughter at bedside complaining patient has decreased p.o. intake and still mental status is not at baseline still confused.  Consultants:     Procedures:   Antimicrobials:   omnicef    Subjective: Pt confused, has no complaints  Objective: Vitals:   12/19/19 0900 12/19/19 1100 12/19/19 1200 12/19/19 1506  BP: 139/75 (!) 126/58 (!) 143/62   Pulse: 76   64  Resp: 16 20 20  (!) 22  Temp:      TempSrc:      SpO2: 96%   95%  Weight:      Height:        Intake/Output Summary (Last 24 hours) at 12/19/2019 1617 Last data filed at 12/18/2019 2313 Gross per 24 hour  Intake 500 ml  Output --  Net 500 ml   Filed Weights   12/18/19 1229  Weight: 81 kg    Examination:  General exam: Appears calm and comfortable, pleasantly confused Respiratory system: Clear to auscultation. Respiratory effort normal. Cardiovascular system: S1 & S2 heard, RRR. No JVD, murmurs, rubs, gallops or clicks.  Gastrointestinal system: Abdomen is nondistended, soft and nontender.  Normal bowel sounds heard. Central nervous system: Awake but not oriented.  Confused more than baseline.  Grossly intact Extremities: No edema Skin: Warm dry Psychiatry: Unable to assess    Data Reviewed: I have personally reviewed following labs and imaging studies  CBC: Recent Labs  Lab 12/18/19 1241 12/19/19 0351  WBC 8.6 8.9  NEUTROABS 7.2  --   HGB 10.9* 9.7*  HCT 32.4* 29.0*  MCV 84.2 85.5  PLT 178 157   Basic Metabolic Panel: Recent Labs  Lab 12/18/19 1241 12/19/19 0351  NA 139  141  K 3.4* 3.8  CL 105 109  CO2 25 26  GLUCOSE 116* 112*  BUN 18 17  CREATININE 1.02* 0.95  CALCIUM 10.4* 9.5   GFR: Estimated Creatinine Clearance: 49 mL/min (by C-G formula based on SCr of 0.95 mg/dL). Liver Function Tests: Recent Labs  Lab 12/18/19 1241  AST 14*  ALT 15  ALKPHOS 96  BILITOT 0.9  PROT 6.3*  ALBUMIN 3.2*   No results for input(s): LIPASE, AMYLASE in the last 168 hours. No results for input(s): AMMONIA in the last 168 hours. Coagulation Profile: No results for input(s): INR, PROTIME in the last 168 hours. Cardiac Enzymes: No results for input(s): CKTOTAL, CKMB, CKMBINDEX, TROPONINI in the last 168 hours. BNP (last 3 results) No results for input(s): PROBNP in the last 8760 hours. HbA1C: No results for input(s): HGBA1C in the last 72 hours. CBG: No results for input(s): GLUCAP in the last 168 hours. Lipid Profile: No results for input(s): CHOL, HDL, LDLCALC, TRIG, CHOLHDL, LDLDIRECT in the last 72 hours. Thyroid Function Tests: No results for input(s): TSH, T4TOTAL, FREET4, T3FREE, THYROIDAB in the last 72 hours. Anemia Panel: No results for input(s): VITAMINB12, FOLATE, FERRITIN, TIBC, IRON, RETICCTPCT in the last 72 hours. Sepsis Labs: Recent Labs  Lab 12/18/19 1252  LATICACIDVEN 1.3    Recent Results (from the past 240 hour(s))  Blood culture (routine x 2)  Status: None (Preliminary result)   Collection Time: 12/18/19 12:52 PM   Specimen: BLOOD  Result Value Ref Range Status   Specimen Description BLOOD LAC  Final   Special Requests   Final    BOTTLES DRAWN AEROBIC AND ANAEROBIC Blood Culture adequate volume   Culture   Final    NO GROWTH < 24 HOURS Performed at [facility], [address], [place], [place] [address]    Report Status PENDING  Incomplete  Respiratory Panel by RT PCR (Flu A&B, Covid) - Urine, Clean Catch     Status: None   Collection Time: 12/18/19 12:52 PM   Specimen: Urine, Clean Catch  Result Value Ref  Range Status   SARS Coronavirus 2 by RT PCR NEGATIVE NEGATIVE Final    Comment: (NOTE) SARS-CoV-2 target nucleic acids are NOT DETECTED.  The SARS-CoV-2 RNA is generally detectable in upper respiratoy specimens during the acute phase of infection. The lowest concentration of SARS-CoV-2 viral copies this assay can detect is 131 copies/mL. A negative result does not preclude SARS-Cov-2 infection and should not be used as the sole basis for treatment or other patient management decisions. A negative result may occur with  improper specimen collection/handling, submission of specimen other than nasopharyngeal swab, presence of viral mutation(s) within the areas targeted by this assay, and inadequate number of viral copies (<131 copies/mL). A negative result must be combined with clinical observations, patient history, and epidemiological information. The expected result is Negative.  Fact Sheet for Patients:  https://www.moore.com/  Fact Sheet for Healthcare Providers:  https://www.young.biz/  This test is no t yet approved or cleared by the [place] FDA and  has been authorized for detection and/or diagnosis of SARS-CoV-2 by FDA under an Emergency Use Authorization (EUA). This EUA will remain  in effect (meaning this test can be used) for the duration of the COVID-19 declaration under Section 564(b)(1) of the Act, 21 U.S.C. section 360bbb-3(b)(1), unless the authorization is terminated or revoked sooner.     Influenza A by PCR NEGATIVE NEGATIVE Final   Influenza B by PCR NEGATIVE NEGATIVE Final    Comment: (NOTE) The Xpert Xpress SARS-CoV-2/FLU/RSV assay is intended as an aid in  the diagnosis of influenza from Nasopharyngeal swab specimens and  should not be used as a sole basis for treatment. Nasal washings and  aspirates are unacceptable for Xpert Xpress SARS-CoV-2/FLU/RSV  testing.  Fact Sheet for  Patients: https://www.moore.com/  Fact Sheet for Healthcare Providers: https://www.young.biz/  This test is not yet approved or cleared by the [place] FDA and  has been authorized for detection and/or diagnosis of SARS-CoV-2 by  FDA under an Emergency Use Authorization (EUA). This EUA will remain  in effect (meaning this test can be used) for the duration of the  Covid-19 declaration under Section 564(b)(1) of the Act, 21  U.S.C. section 360bbb-3(b)(1), unless the authorization is  terminated or revoked. Performed at [facility], [address] [place], [place] [address]   Blood culture (routine x 2)     Status: None (Preliminary result)   Collection Time: 12/18/19 12:53 PM   Specimen: BLOOD  Result Value Ref Range Status   Specimen Description BLOOD RIGHT HAND  Final   Special Requests   Final    BOTTLES DRAWN AEROBIC AND ANAEROBIC Blood Culture adequate volume   Culture   Final    NO GROWTH < 24 HOURS Performed at [facility], [address] [place], [place] [address]  Report Status PENDING  Incomplete         Radiology Studies: DG Chest 1 View  Result Date: 12/18/2019 CLINICAL DATA:  Non smoker, no hx of disease per chart. Pt unable to give answers. AMS. EXAM: CHEST  1 VIEW COMPARISON:  01/27/2018 FINDINGS: Lungs are clear. Heart size and mediastinal contours are within normal limits. Aortic Atherosclerosis (ICD10-170.0). No effusion.  No pneumothorax. Visualized bones unremarkable. IMPRESSION: No acute cardiopulmonary disease. Electronically Signed   By: Corlis Leak M.D.   On: 12/18/2019 13:38   CT Head Wo Contrast  Result Date: 12/18/2019 CLINICAL DATA:  Confusion, head injury. EXAM: CT HEAD WITHOUT CONTRAST TECHNIQUE: Contiguous axial images were obtained from the base of the skull through the vertex without intravenous contrast. COMPARISON:  Jul 25, 2019. FINDINGS: Brain: Mild diffuse cortical atrophy  is noted. Mild chronic ischemic white matter disease is noted. No mass effect or midline shift is noted. Ventricular size is within normal limits. There is no evidence of mass lesion, hemorrhage or acute infarction. Vascular: No hyperdense vessel or unexpected calcification. Skull: Normal. Negative for fracture or focal lesion. Sinuses/Orbits: No acute finding. Other: None. IMPRESSION: Mild diffuse cortical atrophy. Mild chronic ischemic white matter disease. No acute intracranial abnormality seen. Electronically Signed   By: Lupita Raider M.D.   On: 12/18/2019 13:20   CT Renal Stone Study  Result Date: 12/18/2019 CLINICAL DATA:  Painful urination. EXAM: CT ABDOMEN AND PELVIS WITHOUT CONTRAST TECHNIQUE: Multidetector CT imaging of the abdomen and pelvis was performed following the standard protocol without IV contrast. COMPARISON:  September 24, 2019 FINDINGS: Lower chest: There is a very small pericardial effusion. Hepatobiliary: No focal liver abnormality is seen. No gallstones, gallbladder wall thickening, or biliary dilatation. Pancreas: Unremarkable. No pancreatic ductal dilatation or surrounding inflammatory changes. Spleen: Normal in size without focal abnormality. Adrenals/Urinary Tract: Mild to moderate severity diffuse bilateral adrenal gland enlargement is seen. Kidneys are normal in size, without focal lesions. A 1.3 cm renal stone is seen within the right renal pelvis this is unchanged in position when compared to the prior study mild to moderate severity right-sided hydronephrosis is also seen. An additional 2 mm nonobstructing renal stone is seen within the mid right kidney. A cluster of 2 mm, 4 mm and 5 mm nonobstructing renal stones are seen within the posterior aspect of the mid left kidney. Bladder is unremarkable. Stomach/Bowel: Stomach is within normal limits. Appendix appears normal. No evidence of bowel wall thickening, distention, or inflammatory changes. Noninflamed diverticula are seen  throughout the large bowel. Vascular/Lymphatic: There is moderate severity calcification of the abdominal aorta and bilateral common iliac arteries, without evidence of aneurysmal dilatation. No enlarged abdominal or pelvic lymph nodes. Reproductive: Status post hysterectomy. No adnexal masses. Other: No abdominal wall hernia or abnormality. No abdominopelvic ascites. Musculoskeletal: Multilevel degenerative changes seen throughout the lumbar spine IMPRESSION: 1. 1.3 cm renal stone within the right renal pelvis with mild to moderate severity right-sided hydronephrosis. 2. Bilateral nonobstructing renal stones. 3. Colonic diverticulosis. 4. Very small pericardial effusion. 5. Aortic atherosclerosis. Aortic Atherosclerosis (ICD10-I70.0). Electronically Signed   By: Aram Candela M.D.   On: 12/18/2019 15:06   DG HIP UNILAT WITH PELVIS 2-3 VIEWS LEFT  Result Date: 12/19/2019 CLINICAL DATA:  Left hip pain.  Multiple falls.  Dementia. EXAM: DG HIP (WITH OR WITHOUT PELVIS) 2-3V LEFT COMPARISON:  12/18/2019 CT abdomen/pelvis FINDINGS: No left hip fracture or dislocation. No pelvic fracture or diastasis. Mild bilateral hip osteoarthritis. Small enthesophytes at  the superior left greater trochanter. Degenerative changes in the visualized lower lumbar spine. Right renal pelvis 12 mm stone. Additional smaller stones overlie left kidney. No suspicious focal osseous lesions. IMPRESSION: 1. No left hip fracture or dislocation. Mild bilateral hip osteoarthritis. 2. Right renal pelvis 12 mm stone. Additional smaller left renal stones. Electronically Signed   By: Delbert PhenixJason A Poff M.D.   On: 12/19/2019 05:03        Scheduled Meds: . amLODipine  10 mg Oral QHS  . aspirin EC  81 mg Oral Daily  . donepezil  5 mg Oral QHS  . enoxaparin (LOVENOX) injection  40 mg Subcutaneous Q24H  . losartan  50 mg Oral Daily  . melatonin  2.5 mg Oral QHS  . QUEtiapine  50 mg Oral QHS  . senna  1 tablet Oral BID  . sertraline  100 mg  Oral Daily  . vitamin B-12  2,000 mcg Oral Daily   Continuous Infusions: . sodium chloride 50 mL/hr at 12/18/19 2006  . cefTRIAXone (ROCEPHIN)  IV 1 g (12/19/19 1512)    Assessment & Plan:   Active Problems:   HTN (hypertension)   Major depression with psychotic features (HCC)   Mixed Alzheimer's and vascular dementia (HCC)   Gross hematuria   Nephrolithiasis   Hydronephrosis of right kidney   UTI (urinary tract infection)   Dementia with behavioral disturbance (HCC)   Hemorrhagic cystitis   1.MS change-present at admission Has baseline dementia per daughter it has gotten worse last few days.  Has not been taking her home meds and decreased p.o. intake. Likely from urinary tract infection as UA +. ucx pending, blood culture pending Since not taking p.o. intake, will switch p.o. antibiotics to IV Rocephin Start IV fluids for hydration until p.o. intake improves  2.moderate severity right-sided hydronephrosis/Bilateral nonobstructing renal stones- Urology recommended treatment but patient did not appear to need any urgent intervention.  Stent put introduced potentially more discomfort and risk of infection. Will likely need to follow-up with urology as outpatient for monitoring Urology signed off  3.UTI/hemorrhagic cystitis- no evidence of sepsis.no leukocystitis.  UA+ Since unable to take po now, will switch abx to IV Rocephin F/u ucx  4.Dementia-.On Aricept  5.HTN- continue losartan, amlodipine.  If does not take po meds, will add iv hydralazine  6.consult PT/OT  DVT prophylaxis: Lovenox Code Status: DNR Family Communication: Daughter at bedside  Status is: Observation  The patient remains OBS appropriate and will d/c before 2 midnights.  Dispo: The patient is from: Home              Anticipated d/c is to: Home              Anticipated d/c date is: 1 day              Patient currently is not medically stable to d/c.Not taking po intake. Will need PT/OT  evaluation. MS change worse than baseline            LOS: 1 day   Time spent: 45 min with >50% on coc    Lynn ItoSahar Amiliah Campisi, MD [facility] Pager [phone]  If 7PM-7AM, please contact night-coverage [email or web address] Password [email or web address] 12/19/2019, 4:17 PM

## 2019-12-19 NOTE — ED Notes (Signed)
Assumed care of pt, daughter at bedside. Pt resting with eyes closed, denies needs or concerns at this time.

## 2019-12-19 NOTE — ED Notes (Signed)
Report to jessica, rn

## 2019-12-19 NOTE — Discharge Instructions (Signed)

## 2019-12-19 NOTE — ED Notes (Addendum)
Pt daughter arrived / Pt resting .

## 2019-12-19 NOTE — ED Notes (Signed)
Social work spoke to daughter on phone

## 2019-12-19 NOTE — ED Notes (Signed)
IV remains patent, though no blood return noted. IV is leaking around site. Gauze applied with the Cobain. Provider informed.

## 2019-12-19 NOTE — Progress Notes (Addendum)
   12/19/19 1000  Clinical Encounter Type  Visited With Patient and family together  Visit Type Initial  Referral From Nurse  Consult/Referral To Chaplain  Chaplain received a telephone call to go to ED. Chaplain was told the POA wanted to speak to chaplain and chaplain asked in reference to what and the person said they didn't know. When chaplain entered ED unit, a nurse came to her and explained that the situation was about. The daughter is trying to become her mother's poa, there is no one else to act on her behalf. When chaplain entered the room, Pt's daughter was sitting at bedside. Chaplain asked if they could talk in the hall. Daughter explained that her father, Pt's husband was suppose to be taking care of Pt, but he is tired of doing so. Chaplain explained an AD could not be completed because Pt is not of sound mind to give her consent. Chaplain told daughter she might want to speak with an attorney. As they were talking chaplain notice tears welling up in daughter's eyes. Chaplain asked if she could give her a hug. Chaplain hugged and quietly prayed for daughter. Chaplain asked if she had any siblings and she said brother, but....  Daughter and chaplain went in the room and chaplain spoke to Pt., telling her how soothing her voice is and how pleasant she is. Before leaving chaplain told daughter to have her paged if she needed her.

## 2019-12-19 NOTE — ED Notes (Signed)
Pt's daughter states pt fell on Thursday injuring left hip. Pt continues to complain of pain to left hip. Message sent to melody denny, fnp for xray request.

## 2019-12-19 NOTE — ED Notes (Signed)
Urologist at bedside.

## 2019-12-19 NOTE — ED Notes (Signed)
Pt cleansed of incontinent urine, pericare provided. Pt readjusted in bed for comfort. resps unlabored. Daughter at bedside.

## 2019-12-19 NOTE — ED Notes (Addendum)
IV in right AC wrapped with coban for safety.

## 2019-12-19 NOTE — ED Notes (Signed)
Daughter helped pt to eat a few bites of banana and grits.

## 2019-12-19 NOTE — Social Work (Signed)
TOC CM/SW spoke to the patient regarding SNF, advance directives and HCPOA.  Pt needs oncall Chaplin. SW will provide patient with a list of SNF in the [place] area.

## 2019-12-19 NOTE — ED Notes (Signed)
Family member asked for vanilla ice cream for patient which was given. Patient is pleasant, but confused. Patient has periods of agitation and anxiety. Family member asked for Xanax or something "sedating" for the patient. Dr. Marylu Lund notified.

## 2019-12-20 ENCOUNTER — Other Ambulatory Visit: Payer: Self-pay

## 2019-12-20 DIAGNOSIS — N3001 Acute cystitis with hematuria: Secondary | ICD-10-CM | POA: Diagnosis not present

## 2019-12-20 DIAGNOSIS — F0150 Vascular dementia without behavioral disturbance: Secondary | ICD-10-CM

## 2019-12-20 DIAGNOSIS — N3091 Cystitis, unspecified with hematuria: Secondary | ICD-10-CM | POA: Diagnosis not present

## 2019-12-20 DIAGNOSIS — F0280 Dementia in other diseases classified elsewhere without behavioral disturbance: Secondary | ICD-10-CM

## 2019-12-20 DIAGNOSIS — I10 Essential (primary) hypertension: Secondary | ICD-10-CM | POA: Diagnosis not present

## 2019-12-20 DIAGNOSIS — G309 Alzheimer's disease, unspecified: Secondary | ICD-10-CM | POA: Diagnosis not present

## 2019-12-20 MED ORDER — INFLUENZA VAC A&B SA ADJ QUAD 0.5 ML IM PRSY
0.5000 mL | PREFILLED_SYRINGE | INTRAMUSCULAR | Status: DC
  Filled 2019-12-20: qty 0.5

## 2019-12-20 NOTE — ED Notes (Signed)
Pt repositioned in bed, daughter at bedside. Pt talking in full sentences with regular and unlabored breathing. Alert and oriented only to self

## 2019-12-20 NOTE — Evaluation (Signed)
Physical Therapy Evaluation Patient Details Name: Amanda Hancock MRN: [id number] DOB: 1945-05-14 Today's Date: 12/20/2019   History of Present Illness  Pt is a 74 y.o. female with medical history significant of dementia, HTN, nephrolithiasis presented to [facility] due to increased confusion from baseline and recurrent hematuria. She has known non-obstructing nephrolithiasis and prior episodes of hematuria due to cystitis.    Clinical Impression  Patient easily wakes to tactile/verbal cues, answered to her name. Family at bedside reported for the last week or so she has had a significant change in her cognition and functional abilities. Usually pt is able to dress with minimal assistance, transfer with supervision and ambulates usually independently in home, but has used a walker in the past. 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 Pt also reported at least 20 falls in the last 6 months.  The patient with constant multimodal cueing and redirection was able to perform a few supine LE exercises AAROM. Pt did exhibit increased agitation/paranoia with external stimuli. Pt assisted into long sitting with minA, able to sit balanced with supervision. with cueing pt able to bring legs to EOB, fair balance noted. Pt noted to be agitated, further mobility deferred due to safety concerns.  Overall the patient demonstrated deficits (see "PT Problem List") that impede the patient's functional abilities, safety, and mobility and would benefit from skilled PT intervention. Recommendation is SNF due to acute decline in functional status and to decrease risk of falls to maximize safety.     Follow Up Recommendations SNF;Supervision/Assistance - 24 hour    Equipment Recommendations  None recommended by PT    Recommendations for Other Services OT consult     Precautions / Restrictions Precautions Precautions: Fall Restrictions Weight Bearing Restrictions: No      Mobility  Bed Mobility Overal bed  mobility: Needs Assistance Bed Mobility: Supine to Sit;Sit to Supine     Supine to sit: Min assist;HOB elevated Sit to supine: Min assist;HOB elevated   General bed mobility comments: Pt assisted into long sitting with minA, able to sit balanced with supervision. with cueing pt able to bring legs to EOB, fair balance noted. Pt noted to be agitated, further mobility deferred due to safety concerns.  Transfers                 General transfer comment: deferred  Ambulation/Gait                Stairs            Wheelchair Mobility    Modified Rankin (Stroke Patients Only)       Balance Overall balance assessment: Needs assistance Sitting-balance support: Feet unsupported;No upper extremity supported Sitting balance-Leahy Scale: Fair Sitting balance - Comments: able to sit EOB with supervision       Standing balance comment: deferred                             Pertinent Vitals/Pain Pain Assessment: Faces Faces Pain Scale: No hurt    Home Living Family/patient expects to be discharged to:: Private residence Living Arrangements: Spouse/significant other Available Help at Discharge: Family;Available 24 hours/day (has a sitter tuesday and thursday 4hrs) Type of Home: House Home Access: Other (comment) (threshold)     Home Layout: One level;Able to live on main level with bedroom/bathroom (pt does have a step down into bedroom and bathroom) Home Equipment: Walker - 2 wheels;Bedside commode;Grab bars - tub/shower Additional Comments:  Pt has had several falls recently, as well as decreased PO intake. Has had more than 20 falls in the last 6 months    Prior Function Level of Independence: Needs assistance   Gait / Transfers Assistance Needed: was able to ambulate household distances without a device  ADL's / Homemaking Assistance Needed: needs set up and assist with bra and predominantly wears slippers        Hand Dominance   Dominant  Hand: Right    Extremity/Trunk Assessment   Upper Extremity Assessment Upper Extremity Assessment: Difficult to assess due to impaired cognition    Lower Extremity Assessment Lower Extremity Assessment: Difficult to assess due to impaired cognition    Cervical / Trunk Assessment Cervical / Trunk Assessment: Normal  Communication   Communication: HOH  Cognition Arousal/Alertness:  (variable, but able to wake and participate with therapy, quickly returns to resting with eyes closed when not spoke to) Behavior During Therapy: Agitated;Anxious;Restless Overall Cognitive Status: Impaired/Different from baseline                                 General Comments: Pt with dementia at baseline, family in room reported significant change in patient's awareness, safety/judgement, attention for the last week or so. Pt oriented to name only      General Comments      Exercises Other Exercises Other Exercises: ankle pumps, heel slides, and hip abduction/adduction x5 ea LE performed with tactile and verbal cueing. AAROM for all motion and constant re-direction to task   Assessment/Plan    PT Assessment Patient needs continued PT services  PT Problem List Decreased strength;Decreased mobility;Decreased safety awareness;Decreased activity tolerance;Decreased cognition;Decreased balance       PT Treatment Interventions DME instruction;Therapeutic exercise;Gait training;Balance training;Stair training;Neuromuscular re-education;Functional mobility training;Therapeutic activities;Patient/family education    PT Goals (Current goals can be found in the Care Plan section)  Acute Rehab PT Goals Patient Stated Goal: return to PLOF as able PT Goal Formulation: With family Time For Goal Achievement: 01/03/20 Potential to Achieve Goals: Fair    Frequency Min 2X/week   Barriers to discharge Decreased caregiver support      Co-evaluation               AM-PAC PT "6 Clicks"  Mobility  Outcome Measure Help needed turning from your back to your side while in a flat bed without using bedrails?: A Little Help needed moving from lying on your back to sitting on the side of a flat bed without using bedrails?: A Little Help needed moving to and from a bed to a chair (including a wheelchair)?: A Little Help needed standing up from a chair using your arms (e.g., wheelchair or bedside chair)?: A Little Help needed to walk in hospital room?: A Lot Help needed climbing 3-5 steps with a railing? : Total 6 Click Score: 15    End of Session   Activity Tolerance: Treatment limited secondary to agitation Patient left: in bed;with call bell/phone within reach;with family/visitor present Nurse Communication: Mobility status PT Visit Diagnosis: Other abnormalities of gait and mobility (R26.89);Muscle weakness (generalized) (M62.81);Difficulty in walking, not elsewhere classified (R26.2)    Time: 3354-5625 PT Time Calculation (min) (ACUTE ONLY): 35 min   Charges:   PT Evaluation $PT Eval Moderate Complexity: 1 Mod PT Treatments $Therapeutic Exercise: 8-22 mins        Olga Coaster PT, DPT 10:52 AM,12/20/19

## 2019-12-20 NOTE — Evaluation (Signed)
Clinical/Bedside Swallow Evaluation Patient Details  Name: Amanda Hancock MRN: [id number] Date of Birth: [birth year]  Today's Date: 12/20/2019 Time: SLP Start Time (ACUTE ONLY): 9629 SLP Stop Time (ACUTE ONLY): 0936 SLP Time Calculation (min) (ACUTE ONLY): 18 min  Past Medical History:  Past Medical History:  Diagnosis Date  . Abnormal Pap smear    ASCUS  . Anxiety   . Chronic kidney disease    KIDNEYSTONES  . Depression   . Elevated blood pressure (not hypertension)   . Hypertension   . Vaginal Pap smear with ASC-US and positive HPV 12/20/2010  . Vaginal Pap smear, abnormal    Past Surgical History:  Past Surgical History:  Procedure Laterality Date  . ABDOMINAL HYSTERECTOMY    . TUBAL LIGATION     HPI:  Pt is a 74 y.o. female with medical history significant of dementia, HTN, nephrolithiasis presented to [facility] due to increased confusion from baseline and recurrent hematuria. She has known non-obstructing nephrolithiasis and prior episodes of hematuria due to cystitis.   Assessment / Plan / Recommendation Clinical Impression  Pt presents with cognition based oral phase dysphagia. At the time of this evaluation, pt demonstrates severe overall confusion and irritability. She was nota able to open her mouth on request and closed her lips to any PO items. Her daughter was present and states that all of these deficits are acute since last Wednesday. As such, pt's confusion prevented her from preceiving straw or spoon at her mouth. Even with the straw inside her mouth, she was not able to seal lips around straw. Hopefully as UTI is treated, pt's cognitive status will improve to baseline and her oral phase deficits will resolve. At this time, recommend continuing pt's current diet in effort to promote normalcy and maximize opportunities to for PO intake. This Clinical research associate provided extensive education to pt's daughter on presenting meals to pt but if pt is not orally accepting items to stop all  attempts at that meal, retry next meal.   Pt's daughter is aware that if oral deficits remain after resolution of UTI, then pt may very well be in the end stages of dementia.   SLP Visit Diagnosis: Dysphagia, unspecified (R13.10);Dysphagia, oral phase (R13.11)    Aspiration Risk  Mild aspiration risk    Diet Recommendation  (continue currently prescribed diet)   Liquid Administration via: Straw Medication Administration: Whole meds with puree Supervision: Full supervision/cueing for compensatory strategies;Staff to assist with self feeding Compensations: Minimize environmental distractions;Slow rate;Small sips/bites Postural Changes: Seated upright at 90 degrees    Other  Recommendations Oral Care Recommendations: Oral care BID   Follow up Recommendations None      Frequency and Duration            Prognosis        Swallow Study   General Date of Onset: 12/18/19 HPI: Pt is a 74 y.o. female with medical history significant of dementia, HTN, nephrolithiasis presented to [facility] due to increased confusion from baseline and recurrent hematuria. She has known non-obstructing nephrolithiasis and prior episodes of hematuria due to cystitis. Type of Study: Bedside Swallow Evaluation Previous Swallow Assessment: none in chart Diet Prior to this Study: Regular;Thin liquids Temperature Spikes Noted: No Respiratory Status: Room air History of Recent Intubation: No Behavior/Cognition: Alert;Confused;Doesn't follow directions;Requires cueing Oral Care Completed by SLP: No Self-Feeding Abilities: Total assist Patient Positioning: Upright in bed Baseline Vocal Quality: Normal Volitional Cough: Cognitively unable to elicit Volitional Swallow: Unable to elicit    Oral/Motor/Sensory  Function Overall Oral Motor/Sensory Function: Within functional limits   Ice Chips Ice chips: Not tested   Thin Liquid Thin Liquid: Not tested    Nectar Thick Nectar Thick Liquid: Not tested   Honey  Thick Honey Thick Liquid: Not tested   Puree Puree: Not tested   Solid     Solid: Not tested     Taj Nevins B. Dreama Saa M.S., CCC-SLP, [facility] Speech-Language Pathologist Rehabilitation Services Office [phone]  Zykira Matlack 12/20/2019,4:08 PM

## 2019-12-20 NOTE — ED Notes (Signed)
Pt repositioned in stretcher. purewick in place. Daughter at bedside

## 2019-12-20 NOTE — ED Notes (Signed)
This RN and Pattricia Boss, RN at bedside attempting to update vitals and change pt. While obtaining pt's BP pt yelling and becoming agitated. Unable to obtain accurate BP at this time.

## 2019-12-20 NOTE — Evaluation (Signed)
Occupational Therapy Evaluation Patient Details Name: Amanda Hancock MRN: [id number] DOB: 12/14/1945 Today's Date: 12/20/2019    History of Present Illness Pt is a 74 y.o. female with medical history significant of dementia, HTN, nephrolithiasis presented to [facility] due to increased confusion from baseline and recurrent hematuria. She has known non-obstructing nephrolithiasis and prior episodes of hematuria due to cystitis.   Clinical Impression   Pt was seen for OT evaluation this date. Prior to hospital admission and up until last week daughter reports that pt was ambulating without an AD, able to self feed and toilet without direct assist, and required some assist for bathing and dressing from spouse. Since then, daughter reports pt requiring increased assist, more confused, and easily aggitated. Pt lives with her spouse. Pt sleepy, easily wakes to verbal cues and pleasantly greets therapist with cues. Pt required increased time and simple repeated cues for sequencing to sit EOB, some agitation noted. Pt able to sit EOB wihtout direct assist, <36min before requesting to lay back down. Dtr endorses that pt was up until past 3am this morning. Currently pt demonstrates impairments as described below (See OT problem list) which functionally limit *his/her ability to perform ADL/self-care tasks. Pt currently requires at least MOD A for LB ADL tasks and seated UB ADL tasks. Difficulty with self feeding at this time per dtr's report requiring significant assist but pt declining to eat/drink.  Pt would benefit from skilled OT services to address noted impairments and functional limitations (see below for any additional details) in order to maximize safety and independence while minimizing falls risk and caregiver burden. Upon hospital discharge, recommend STR to maximize pt safety and return to PLOF.     Follow Up Recommendations  SNF;Supervision/Assistance - 24 hour    Equipment Recommendations  3 in 1  bedside commode    Recommendations for Other Services       Precautions / Restrictions Precautions Precautions: Fall Restrictions Weight Bearing Restrictions: No      Mobility Bed Mobility Overal bed mobility: Needs Assistance Bed Mobility: Supine to Sit;Sit to Supine     Supine to sit: Min assist;HOB elevated Sit to supine: Min assist;HOB elevated   General bed mobility comments: Pt assisted into long sitting with minA with multiple attempts 2/2 pt returning to supine. Once seated, able to sit balanced with supervision. with max cueing pt able to bring legs to EOB, fair balance noted. Pt noted to be agitated, further mobility deferred due to safety concerns.  Transfers                 General transfer comment: deferred    Balance Overall balance assessment: Needs assistance Sitting-balance support: Feet unsupported;No upper extremity supported Sitting balance-Leahy Scale: Fair Sitting balance - Comments: able to sit EOB with supervision       Standing balance comment: deferred                           ADL either performed or assessed with clinical judgement   ADL Overall ADL's : Needs assistance/impaired                                       General ADL Comments: Pt currently requires at least MOD A for LB ADL and UB ADL tasks with 1-step commands for sequencing/safety     Vision  Perception     Praxis      Pertinent Vitals/Pain Pain Assessment: Faces Faces Pain Scale: Hurts a little bit Pain Location: L knee with bed mobility attempts Pain Descriptors / Indicators: Grimacing;Guarding Pain Intervention(s): Limited activity within patient's tolerance;Monitored during session;Repositioned     Hand Dominance Right   Extremity/Trunk Assessment Upper Extremity Assessment Upper Extremity Assessment: Difficult to assess due to impaired cognition   Lower Extremity Assessment Lower Extremity Assessment: Difficult  to assess due to impaired cognition   Cervical / Trunk Assessment Cervical / Trunk Assessment: Normal   Communication Communication Communication: HOH   Cognition Arousal/Alertness: Awake/alert Behavior During Therapy: Flat affect;Agitated Overall Cognitive Status: Impaired/Different from baseline                                 General Comments: Pt with dementia at baseline, family in room reported significant change in patient's awareness, safety/judgement, attention for the last week or so. Pt disoriented x4, requires simple cues/commands for sequencing/safety   General Comments       Exercises Other Exercises Other Exercises: ankle pumps, heel slides, and hip abduction/adduction x5 ea LE performed with tactile and verbal cueing. AAROM for all motion and constant re-direction to task Other Exercises: Bed mobility, cues for safety/sequencing, tolerates sitting <11min, endorses fatigue, reports initially feeling "funny" but it resolved quickly   Shoulder Instructions      Home Living Family/patient expects to be discharged to:: Private residence Living Arrangements: Spouse/significant other Available Help at Discharge: Family;Available 24 hours/day (has a sitter tuesday and thursday 4hrs) Type of Home: House Home Access: Other (comment) (threshold)     Home Layout: One level;Able to live on main level with bedroom/bathroom (pt does have a step down into bedroom and bathroom)     Bathroom Shower/Tub: Chief Strategy Officer: Standard     Home Equipment: Environmental consultant - 2 wheels;Bedside commode;Grab bars - tub/shower   Additional Comments: Pt has had several falls recently, as well as decreased PO intake. Has had more than 20 falls in the last 6 months      Prior Functioning/Environment Level of Independence: Needs assistance  Gait / Transfers Assistance Needed: was able to ambulate household distances without a device ADL's / Homemaking Assistance  Needed: needs set up and assist with bathing and dressing from spouse; predominantly wears slippers; was able to perform all aspects of toileting without direct assist, was eating without assist            OT Problem List: Decreased strength;Decreased cognition;Decreased safety awareness;Decreased knowledge of use of DME or AE      OT Treatment/Interventions: Self-care/ADL training;Therapeutic exercise;Therapeutic activities;Cognitive remediation/compensation;DME and/or AE instruction;Patient/family education;Balance training    OT Goals(Current goals can be found in the care plan section) Acute Rehab OT Goals Patient Stated Goal: return to PLOF as able OT Goal Formulation: With patient/family Time For Goal Achievement: 01/03/20 Potential to Achieve Goals: Fair ADL Goals Pt Will Perform Eating: with set-up;with supervision;sitting (PRN VC to initiate) Pt Will Transfer to Toilet: with min assist;bedside commode;ambulating (LRAD for amb, VC for safety/sequencing) Additional ADL Goal #1: Pt will perform seated ADL tasks with simple 1 step cues for sequencing and safety.  OT Frequency: Min 1X/week   Barriers to D/C:            Co-evaluation              AM-PAC OT "6 Clicks" Daily  Activity     Outcome Measure Help from another person eating meals?: A Little Help from another person taking care of personal grooming?: A Little Help from another person toileting, which includes using toliet, bedpan, or urinal?: A Lot Help from another person bathing (including washing, rinsing, drying)?: A Lot Help from another person to put on and taking off regular upper body clothing?: A Lot Help from another person to put on and taking off regular lower body clothing?: A Lot 6 Click Score: 14   End of Session    Activity Tolerance: Patient tolerated treatment well Patient left: in bed;with call bell/phone within reach;with family/visitor present  OT Visit Diagnosis: Other abnormalities  of gait and mobility (R26.89);Other symptoms and signs involving cognitive function;Repeated falls (R29.6)                Time: 0786-7544 OT Time Calculation (min): 24 min Charges:  OT General Charges $OT Visit: 1 Visit OT Evaluation $OT Eval High Complexity: 1 High OT Treatments $Therapeutic Activity: 8-22 mins  Richrd Prime, MPH, MS, OTR/L ascom [phone] 12/20/19, 12:37 PM

## 2019-12-20 NOTE — Progress Notes (Signed)
PROGRESS NOTE    Amanda Hancock  FAO:[id number] DOB: 1945/06/04 DOA: 12/18/2019 PCP: Doreene Nest, NP    Brief Narrative:  Amanda Hancock is a 74 y.o. female with medical history significant of dementia, HTN, nephrolithiasis presented to [facility] due to increased confusion from baseline and recurrent hematuria. She has known non-obstructing nephrolithiasis and prior episodes of hematuria due to cystitis.   10/3-daughter at bedside complaining patient has decreased p.o. intake and still mental status is not at baseline still confused.  Consultants:     Procedures:   Antimicrobials:   omnicef    Subjective: Pt friendly , pleasant. Confused. Per daughter at bedside, pt still not eating much.   Objective: Vitals:   12/19/19 1515 12/19/19 1530 12/19/19 2058 12/20/19 0114  BP:   (!) 171/62 (!) 163/54  Pulse: 65 74 63 64  Resp:   20 18  Temp:   98.9 F (37.2 C)   TempSrc:   Oral   SpO2: 92% 96% 94% 94%  Weight:      Height:        Intake/Output Summary (Last 24 hours) at 12/20/2019 1622 Last data filed at 12/20/2019 0815 Gross per 24 hour  Intake 1000 ml  Output 850 ml  Net 150 ml   Filed Weights   12/18/19 1229  Weight: 81 kg    Examination: Calm, laying in bed.  Daughter at bedside CTA, no wheeze rales rhonchi's RRR S1-S2 no murmurs Soft nontender nondistended positive bowel sounds No edema bilaterally Awake, confused into to time , place, and person Mood and affect appropriate in current setting.    Data Reviewed: I have personally reviewed following labs and imaging studies  CBC: Recent Labs  Lab 12/18/19 1241 12/19/19 0351  WBC 8.6 8.9  NEUTROABS 7.2  --   HGB 10.9* 9.7*  HCT 32.4* 29.0*  MCV 84.2 85.5  PLT 178 157   Basic Metabolic Panel: Recent Labs  Lab 12/18/19 1241 12/19/19 0351  NA 139 141  K 3.4* 3.8  CL 105 109  CO2 25 26  GLUCOSE 116* 112*  BUN 18 17  CREATININE 1.02* 0.95  CALCIUM 10.4* 9.5   GFR: Estimated  Creatinine Clearance: 49 mL/min (by C-G formula based on SCr of 0.95 mg/dL). Liver Function Tests: Recent Labs  Lab 12/18/19 1241  AST 14*  ALT 15  ALKPHOS 96  BILITOT 0.9  PROT 6.3*  ALBUMIN 3.2*   No results for input(s): LIPASE, AMYLASE in the last 168 hours. No results for input(s): AMMONIA in the last 168 hours. Coagulation Profile: No results for input(s): INR, PROTIME in the last 168 hours. Cardiac Enzymes: No results for input(s): CKTOTAL, CKMB, CKMBINDEX, TROPONINI in the last 168 hours. BNP (last 3 results) No results for input(s): PROBNP in the last 8760 hours. HbA1C: No results for input(s): HGBA1C in the last 72 hours. CBG: No results for input(s): GLUCAP in the last 168 hours. Lipid Profile: No results for input(s): CHOL, HDL, LDLCALC, TRIG, CHOLHDL, LDLDIRECT in the last 72 hours. Thyroid Function Tests: No results for input(s): TSH, T4TOTAL, FREET4, T3FREE, THYROIDAB in the last 72 hours. Anemia Panel: No results for input(s): VITAMINB12, FOLATE, FERRITIN, TIBC, IRON, RETICCTPCT in the last 72 hours. Sepsis Labs: Recent Labs  Lab 12/18/19 1252  LATICACIDVEN 1.3    Recent Results (from the past 240 hour(s))  Blood culture (routine x 2)     Status: None (Preliminary result)   Collection Time: 12/18/19 12:52 PM   Specimen: BLOOD  Result Value Ref Range Status   Specimen Description BLOOD LAC  Final   Special Requests   Final    BOTTLES DRAWN AEROBIC AND ANAEROBIC Blood Culture adequate volume   Culture   Final    NO GROWTH 2 DAYS Performed at [facility], [address], [place], [place] [address]    Report Status PENDING  Incomplete  Respiratory Panel by RT PCR (Flu A&B, Covid) - Urine, Clean Catch     Status: None   Collection Time: 12/18/19 12:52 PM   Specimen: Urine, Clean Catch  Result Value Ref Range Status   SARS Coronavirus 2 by RT PCR NEGATIVE NEGATIVE Final    Comment: (NOTE) SARS-CoV-2 target nucleic acids are NOT  DETECTED.  The SARS-CoV-2 RNA is generally detectable in upper respiratoy specimens during the acute phase of infection. The lowest concentration of SARS-CoV-2 viral copies this assay can detect is 131 copies/mL. A negative result does not preclude SARS-Cov-2 infection and should not be used as the sole basis for treatment or other patient management decisions. A negative result may occur with  improper specimen collection/handling, submission of specimen other than nasopharyngeal swab, presence of viral mutation(s) within the areas targeted by this assay, and inadequate number of viral copies (<131 copies/mL). A negative result must be combined with clinical observations, patient history, and epidemiological information. The expected result is Negative.  Fact Sheet for Patients:  https://www.moore.com/  Fact Sheet for Healthcare Providers:  https://www.young.biz/  This test is no t yet approved or cleared by the [place] FDA and  has been authorized for detection and/or diagnosis of SARS-CoV-2 by FDA under an Emergency Use Authorization (EUA). This EUA will remain  in effect (meaning this test can be used) for the duration of the COVID-19 declaration under Section 564(b)(1) of the Act, 21 U.S.C. section 360bbb-3(b)(1), unless the authorization is terminated or revoked sooner.     Influenza A by PCR NEGATIVE NEGATIVE Final   Influenza B by PCR NEGATIVE NEGATIVE Final    Comment: (NOTE) The Xpert Xpress SARS-CoV-2/FLU/RSV assay is intended as an aid in  the diagnosis of influenza from Nasopharyngeal swab specimens and  should not be used as a sole basis for treatment. Nasal washings and  aspirates are unacceptable for Xpert Xpress SARS-CoV-2/FLU/RSV  testing.  Fact Sheet for Patients: https://www.moore.com/  Fact Sheet for Healthcare Providers: https://www.young.biz/  This test is not yet  approved or cleared by the [place] FDA and  has been authorized for detection and/or diagnosis of SARS-CoV-2 by  FDA under an Emergency Use Authorization (EUA). This EUA will remain  in effect (meaning this test can be used) for the duration of the  Covid-19 declaration under Section 564(b)(1) of the Act, 21  U.S.C. section 360bbb-3(b)(1), unless the authorization is  terminated or revoked. Performed at [facility], [address], [place], [place] [address]   Urine culture     Status: Abnormal (Preliminary result)   Collection Time: 12/18/19 12:52 PM   Specimen: Urine, Random  Result Value Ref Range Status   Specimen Description   Final    URINE, RANDOM Performed at [facility], [address], [place], [place] [address]    Special Requests   Final    NONE Performed at [facility], [address] [place], [place] [address]    Culture (A)  Final    >=100,000 COLONIES/mL ESCHERICHIA COLI SUSCEPTIBILITIES TO FOLLOW Performed at [facility], [address], [place],  [place] [id number]    Report Status PENDING  Incomplete  Blood culture (routine x 2)     Status: None (Preliminary result)   Collection Time: 12/18/19 12:53 PM   Specimen: BLOOD  Result Value Ref Range Status   Specimen Description BLOOD RIGHT HAND  Final   Special Requests   Final    BOTTLES DRAWN AEROBIC AND ANAEROBIC Blood Culture adequate volume   Culture   Final    NO GROWTH 2 DAYS Performed at [facility], [address] [place], [place] [address]    Report Status PENDING  Incomplete         Radiology Studies: DG HIP UNILAT WITH PELVIS 2-3 VIEWS LEFT  Result Date: 12/19/2019 CLINICAL DATA:  Left hip pain.  Multiple falls.  Dementia. EXAM: DG HIP (WITH OR WITHOUT PELVIS) 2-3V LEFT COMPARISON:  12/18/2019 CT abdomen/pelvis FINDINGS: No left hip fracture or dislocation. No pelvic fracture or diastasis. Mild bilateral hip osteoarthritis. Small  enthesophytes at the superior left greater trochanter. Degenerative changes in the visualized lower lumbar spine. Right renal pelvis 12 mm stone. Additional smaller stones overlie left kidney. No suspicious focal osseous lesions. IMPRESSION: 1. No left hip fracture or dislocation. Mild bilateral hip osteoarthritis. 2. Right renal pelvis 12 mm stone. Additional smaller left renal stones. Electronically Signed   By: Delbert PhenixJason A Poff M.D.   On: 12/19/2019 05:03        Scheduled Meds: . amLODipine  10 mg Oral QHS  . aspirin EC  81 mg Oral Daily  . donepezil  5 mg Oral QHS  . enoxaparin (LOVENOX) injection  40 mg Subcutaneous Q24H  . losartan  50 mg Oral Daily  . melatonin  2.5 mg Oral QHS  . QUEtiapine  50 mg Oral QHS  . senna  1 tablet Oral BID  . sertraline  100 mg Oral Daily  . vitamin B-12  2,000 mcg Oral Daily   Continuous Infusions: . sodium chloride 75 mL/hr at 12/19/19 2227  . cefTRIAXone (ROCEPHIN)  IV 1 g (12/20/19 1401)    Assessment & Plan:   Active Problems:   HTN (hypertension)   Major depression with psychotic features (HCC)   Mixed Alzheimer's and vascular dementia (HCC)   Gross hematuria   Nephrolithiasis   Hydronephrosis of right kidney   UTI (urinary tract infection)   Dementia with behavioral disturbance (HCC)   Hemorrhagic cystitis   1.MS change-present at admission Has baseline dementia per daughter it has gotten worse last few days.  Has not been taking her home meds and decreased p.o. intake. 10/4-likely 2/2 UTI. Continue supportive care, reorientation , encouraged po intake. Continue on iv abx.  ivf for hydration until she starts to increase po intake   2.moderate severity right-sided hydronephrosis/Bilateral nonobstructing renal stones- Urology recommended treatment but patient did not appear to need any urgent intervention.  Stent put introduced potentially more discomfort and risk of infection. 10/4-needs to f/u with urology as outpatient for  monitoring Urology signed off on 10/3  3.UTI/hemorrhagic cystitis- no evidence of sepsis.no leukocystitis.  10/4-Ucx + E.coli, sensitivity pending. Continue with iv Rocephin    4.Dementia-.Continue Aricept  5.HTN- stable, continue losartan, amlodipine Iv hydralazine   6.PT/OT-rec. SNF  DVT prophylaxis: Lovenox Code Status: DNR Family Communication: Daughter at bedside  Status is: Inpatient Patient is inpatient status due to severity of illness, has mental status change and decrease po intake  Dispo: The patient is from: Home  Anticipated d/c is to: SNF              Anticipated d/c date is: 2 days              Patient currently is not medically stable to d/c. Needs SNF. Also no MS change not at baseline and has decrease po intake.            LOS: 1 day   Time spent: 35 min with >50% on coc    Lynn Ito, MD [facility] Pager [phone]  If 7PM-7AM, please contact night-coverage [email or web address] Password [email or web address] 12/20/2019, 4:22 PM

## 2019-12-20 NOTE — ED Notes (Signed)
This tech emptied urine from purewick.  Family member at bedside. No other needs found at this time.

## 2019-12-20 NOTE — ED Notes (Signed)
Pills crushed and administered with chocolate pudding.  Daughter at bedside, extremely helpful with giving patient medications.  Patient tolerated well.

## 2019-12-21 ENCOUNTER — Encounter: Payer: Self-pay | Admitting: Internal Medicine

## 2019-12-21 ENCOUNTER — Telehealth: Payer: Self-pay | Admitting: Urology

## 2019-12-21 DIAGNOSIS — R310 Gross hematuria: Secondary | ICD-10-CM | POA: Diagnosis not present

## 2019-12-21 DIAGNOSIS — F323 Major depressive disorder, single episode, severe with psychotic features: Secondary | ICD-10-CM

## 2019-12-21 DIAGNOSIS — I10 Essential (primary) hypertension: Secondary | ICD-10-CM | POA: Diagnosis not present

## 2019-12-21 DIAGNOSIS — N3091 Cystitis, unspecified with hematuria: Secondary | ICD-10-CM | POA: Diagnosis not present

## 2019-12-21 DIAGNOSIS — N3001 Acute cystitis with hematuria: Secondary | ICD-10-CM | POA: Diagnosis not present

## 2019-12-21 LAB — URINE CULTURE: Culture: >=100000 — AB

## 2019-12-21 MED ORDER — POLYETHYLENE GLYCOL 3350 17 G PO PACK
17.0000 g | PACK | Freq: Every day | ORAL | Status: DC
  Administered 2019-12-21 – 2019-12-24 (×4): 17 g via ORAL
  Filled 2019-12-21 (×5): qty 1

## 2019-12-21 MED ORDER — RISAQUAD PO CAPS
2.0000 | ORAL_CAPSULE | Freq: Three times a day (TID) | ORAL | Status: DC
  Administered 2019-12-21 – 2019-12-24 (×10): 2 via ORAL
  Filled 2019-12-21 (×10): qty 2

## 2019-12-21 MED ORDER — DOCUSATE SODIUM 100 MG PO CAPS
100.0000 mg | ORAL_CAPSULE | Freq: Every day | ORAL | Status: DC
  Administered 2019-12-21 – 2019-12-28 (×4): 100 mg via ORAL
  Filled 2019-12-21 (×4): qty 1

## 2019-12-21 NOTE — Telephone Encounter (Signed)
APP MADE PT IS STILL IN PATIENT AS OF TODAY  PATIENT IS NOT AWARE OF APP YET  Amanda Hancock

## 2019-12-21 NOTE — Progress Notes (Signed)
  Chaplain On-Call responded to Order Requisition for Advance Directives information.  Chaplain met the patient and her daughter Alyse Low at bedside. Chaplain learned from [place] that she and her father are seeking to establish healthcare decisions in place due to patient's diagnosis of dementia.  Chaplain gave AD documents to [place] and provided education.  Chaplain offered spiritual and emotional support to patient and daughter.  [place] Luian Schumpert M.Div., [facility]

## 2019-12-21 NOTE — TOC Initial Note (Signed)
Transition of Care [facility]) - Initial/Assessment Note    Patient Details  Name: Amanda Hancock MRN: [id number] Date of Birth: 1945/05/16  [facility]) CM/SW Contact:    Candie Chroman, LCSW Phone Number: 12/21/2019, 10:18 AM  Clinical Narrative: Patient only oriented to self. CSW called patient's husband, introduced role, and explained that PT recommendations would be discussed. Mr. Betterton said that his daughter typically handles anything medical so he would like CSW to defer conversation to her. Daughter in room. CSW met with patient and daughter at bedside. Daughter is aware of SNF recommendation and is agreeable. Preferences are [facility], [facility], and [facility] She would be agreeable to [facility] as a back up. Patient has not had her COVID vaccines due to concern it could speed the progression of her dementia. CSW made daughter aware of 14-day quarantine for non-vaccinated patients at [facility]. PASARR screening has been started and is under manual review. No further concerns. CSW encouraged patient's daughter to contact CSW as needed. CSW will continue to follow patient and her family for support and facilitate discharge to SNF once medically stable.                Expected Discharge Plan: Skilled Nursing Facility Barriers to Discharge: Continued Medical Work up, [place] (PASRR), Insurance Authorization   Patient Goals and CMS Choice Patient states their goals for this hospitalization and ongoing recovery are:: Patient not fully oriented. CMS Medicare.gov Compare Post Acute Care list provided to:: Patient Represenative (must comment) (Daughter)    Expected Discharge Plan and Services Expected Discharge Plan: [place] Acute Care Choice: [place] Living arrangements for the past 2 months: Single Family Home                                      Prior Living Arrangements/Services Living  arrangements for the past 2 months: Single Family Home Lives with:: Spouse Patient language and need for interpreter reviewed:: Yes Do you feel safe going back to the place where you live?: Yes      Need for Family Participation in Patient Care: Yes (Comment) Care giver support system in place?: Yes (comment)   Criminal Activity/Legal Involvement Pertinent to Current Situation/Hospitalization: No - Comment as needed  Activities of Daily Living Home Assistive Devices/Equipment: Cane (specify quad or straight) ADL Screening (condition at time of admission) Patient's cognitive ability adequate to safely complete daily activities?: No Is the patient deaf or have difficulty hearing?: No Does the patient have difficulty seeing, even when wearing glasses/contacts?: No Does the patient have difficulty concentrating, remembering, or making decisions?: Yes Patient able to express need for assistance with ADLs?: Yes Does the patient have difficulty dressing or bathing?: Yes Independently performs ADLs?: No Communication: Needs assistance Is this a change from baseline?: Pre-admission baseline Dressing (OT): Needs assistance Is this a change from baseline?: Pre-admission baseline Grooming: Needs assistance Is this a change from baseline?: Pre-admission baseline Feeding: Needs assistance Is this a change from baseline?: Pre-admission baseline Bathing: Needs assistance Is this a change from baseline?: Pre-admission baseline Toileting: Dependent Is this a change from baseline?: Pre-admission baseline In/Out Bed: Needs assistance Is this a change from baseline?: Pre-admission baseline Walks in Home: Needs assistance Is this a change from baseline?: Pre-admission baseline Does the patient have difficulty walking or climbing stairs?: No Weakness of Legs:  Both Weakness of Arms/Hands: None  Permission Sought/Granted Permission sought to share information with : Facility Sport and exercise psychologist,  Family Supports    Share Information with NAME: Obie Silos, [place] granted to share info w AGENCY: SNF's  Permission granted to share info w Relationship: Spouse and daughter  Permission granted to share info w Contact Information: Clance Boll: [phone], Margreta Journey: [phone]  Emotional Assessment Appearance:: Appears stated age Attitude/Demeanor/Rapport: Unable to Assess Affect (typically observed): Unable to Assess Orientation: : Oriented to Self Alcohol / Substance Use: Not Applicable Psych Involvement: No (comment)  Admission diagnosis:  Hemorrhagic cystitis [N30.91] Nephrolithiasis [N20.0] UTI (urinary tract infection) [N39.0] Altered mental status [R41.82] Fall [W19.XXXA] Acute cystitis with hematuria [N30.01] Altered mental status, unspecified altered mental status type [R41.82] Patient Active Problem List   Diagnosis Date Noted  . Nephrolithiasis 12/18/2019  . Hydronephrosis of right kidney 12/18/2019  . UTI (urinary tract infection) 12/18/2019  . Dementia with behavioral disturbance ([place]) 12/18/2019  . Hemorrhagic cystitis 12/18/2019  . Gross hematuria 09/10/2019  . Laceration of skin of forehead 08/02/2019  . Urinary frequency 06/03/2019  . Mixed Alzheimer's and vascular dementia ([place]) 12/09/2018  . Major depression with psychotic features ([place]) 10/16/2016  . Preventative health care 10/11/2016  . Chest pain 11/03/2013  . Hypercalcemia 10/09/2013  . Vitamin D deficiency 10/09/2013  . Vaginal Pap smear with LGSIL, positive HRHPV Type 16 03/16/2013  . HTN (hypertension) 01/28/2011   PCP:  Pleas Koch, NP Pharmacy:   [facility] [address], [place] [address] [place] [address] Phone: [phone] Fax: [phone]     Social Determinants of Health (SDOH) Interventions    Readmission Risk Interventions No flowsheet data found.

## 2019-12-21 NOTE — Telephone Encounter (Signed)
-----   Message from Jerilee Field, MD sent at 12/19/2019 10:15 AM EDT ----- I need to see Ms Metheney in office next couple of weeks for a right renal stone -- thanks!

## 2019-12-21 NOTE — Progress Notes (Signed)
PT Cancellation Note  Patient Details Name: Amanda Hancock MRN: [id number] DOB: 1945/05/10   Cancelled Treatment:     PT attempt. Pt is not available at this time. Chaplain in room talking to patient and family. Acute PT will continue to follow per POC.    Rushie Chestnut 12/21/2019, 3:17 PM

## 2019-12-21 NOTE — NC FL2 (Signed)
[place] MEDICAID FL2 LEVEL OF CARE SCREENING TOOL     IDENTIFICATION  Patient Name: Amanda Hancock Birthdate: 01/20/1946 Sex: female Admission Date (Current Location): 12/18/2019  [place] and [place] Number:  Chiropodist and Address:  [facility], [address] [place], [place] [address]      Provider Number: [id number]  Attending Physician Name and Address:  Lynn Ito, MD  Relative Name and Phone Number:       Current Level of Care: Hospital Recommended Level of Care: Skilled Nursing Facility Prior Approval Number:    Date Approved/Denied:   PASRR Number: Manual review  Discharge Plan: SNF    Current Diagnoses: Patient Active Problem List   Diagnosis Date Noted  . Nephrolithiasis 12/18/2019  . Hydronephrosis of right kidney 12/18/2019  . UTI (urinary tract infection) 12/18/2019  . Dementia with behavioral disturbance (HCC) 12/18/2019  . Hemorrhagic cystitis 12/18/2019  . Gross hematuria 09/10/2019  . Laceration of skin of forehead 08/02/2019  . Urinary frequency 06/03/2019  . 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 Major depression with psychotic features (HCC) 10/16/2016  . Preventative health care 10/11/2016  . Chest pain 11/03/2013  . Hypercalcemia 10/09/2013  . Vitamin D deficiency 10/09/2013  . Vaginal Pap smear with LGSIL, positive HRHPV Type 16 03/16/2013  . HTN (hypertension) 01/28/2011    Orientation RESPIRATION BLADDER Height & Weight     Self  Normal Continent, External catheter Weight: 178 lb 9.2 oz (81 kg) Height:  5' (152.4 cm)  BEHAVIORAL SYMPTOMS/MOOD NEUROLOGICAL BOWEL NUTRITION STATUS  Wanderer, Other (Comment) (Per PT eval, wanderer at baseline. Unable to ambulate at this time. Has had some agitation but has improved.)  (Mixed Alzheimer's and vascular dementia) Continent Diet  AMBULATORY STATUS COMMUNICATION OF NEEDS Skin   Extensive Assist Verbally Bruising, Skin abrasions                        Personal Care Assistance Level of Assistance  Bathing, Feeding, Dressing Bathing Assistance: Maximum assistance Feeding assistance: Limited assistance Dressing Assistance: Maximum assistance     Functional Limitations Info  Sight, Hearing, Speech Sight Info: Adequate Hearing Info: Adequate Speech Info: Adequate    SPECIAL CARE FACTORS FREQUENCY  PT (By licensed PT), OT (By licensed OT)     PT Frequency: 5 x week OT Frequency: 5 x week            Contractures Contractures Info: Not present    Additional Factors Info  Code Status, Allergies, Psychotropic Code Status Info: Full code Allergies Info: Codeine, Ace Inhibitors, Hctz (Hydrochlorothiazide). Psychotropic Info: Major Depression with psychotic features: Seroquel 50 mg PO QHS, Zoloft 100 mg PO daily         Current Medications (12/21/2019):  This is the current hospital active medication list Current Facility-Administered Medications  Medication Dose Route Frequency Provider Last Rate Last Admin  . 0.9 %  sodium chloride infusion   Intravenous Continuous Jimmye Norman, NP 75 mL/hr at 12/21/19 0400 Rate Verify at 12/21/19 0400  . acetaminophen (TYLENOL) tablet 650 mg  650 mg Oral Q6H PRN Norins, Rosalyn Gess, MD       Or  . acetaminophen (TYLENOL) suppository 650 mg  650 mg Rectal Q6H PRN Norins, Rosalyn Gess, MD      . amLODipine (NORVASC) tablet 10 mg  10 mg Oral QHS Norins, Rosalyn Gess, MD   10 mg at 12/20/19 2229  . aspirin EC tablet 81  mg  81 mg Oral Daily Jacques Navy, MD   81 mg at 12/20/19 1015  . cefTRIAXone (ROCEPHIN) 1 g in sodium chloride 0.9 % 100 mL IVPB  1 g Intravenous Q24H Lynn Ito, MD   Stopped at 12/20/19 1625  . donepezil (ARICEPT) tablet 5 mg  5 mg Oral QHS Norins, Rosalyn Gess, MD   5 mg at 12/20/19 2229  . enoxaparin (LOVENOX) injection 40 mg  40 mg Subcutaneous Q24H Norins, Rosalyn Gess, MD   40 mg at 12/19/19 2053  . hydrALAZINE (APRESOLINE) injection 10 mg  10  mg Intravenous Q6H PRN Lynn Ito, MD      . influenza vaccine adjuvanted (FLUAD) injection 0.5 mL  0.5 mL Intramuscular Tomorrow-1000 Lynn Ito, MD      . losartan (COZAAR) tablet 50 mg  50 mg Oral Daily Norins, Rosalyn Gess, MD   50 mg at 12/20/19 1014  . melatonin tablet 2.5 mg  2.5 mg Oral QHS Norins, Rosalyn Gess, MD   2.5 mg at 12/20/19 2228  . ondansetron (ZOFRAN) injection 4 mg  4 mg Intravenous Q8H PRN Lynn Ito, MD   4 mg at 12/19/19 1505  . QUEtiapine (SEROQUEL) tablet 50 mg  50 mg Oral QHS Norins, Rosalyn Gess, MD   50 mg at 12/20/19 2230  . senna (SENOKOT) tablet 8.6 mg  1 tablet Oral BID Norins, Rosalyn Gess, MD   8.6 mg at 12/20/19 2230  . sertraline (ZOLOFT) tablet 100 mg  100 mg Oral Daily Norins, Rosalyn Gess, MD   100 mg at 12/20/19 1014  . vitamin B-12 (CYANOCOBALAMIN) tablet 2,000 mcg  2,000 mcg Oral Daily Norins, Rosalyn Gess, MD   2,000 mcg at 12/20/19 1015     Discharge Medications: Please see discharge summary for a list of discharge medications.  Relevant Imaging Results:  Relevant Lab Results:   Additional Information SS#: [id number]  Margarito Liner, LCSW

## 2019-12-21 NOTE — Progress Notes (Signed)
Approximately 18:00 this RN checked on patient and noticed daughter at bedside was tearful due to patient was hallucinating,impulsive to get out of bed, seeing "Jesus" and some other people that have passed away years ago. Per daughter, "patient said"  people who have passed away were here to take her. Daughter has never seen this behavior before and very worried. We will continue to monitor patient closely to see if this happens again.

## 2019-12-21 NOTE — Progress Notes (Signed)
PROGRESS NOTE    Amanda Hancock  ZOX:[id number] DOB: Feb 20, 1946 DOA: 12/18/2019 PCP: Doreene Nestlark, Katherine K, NP    Brief Narrative:  Amanda Hancock is a 74 y.o. female with medical history significant of dementia, HTN, nephrolithiasis presented to [facility] due to increased confusion from baseline and recurrent hematuria. She has known non-obstructing nephrolithiasis and prior episodes of hematuria due to cystitis.   10/3-daughter at bedside complaining patient has decreased p.o. intake and still mental status is not at baseline still confused.  Consultants:     Procedures:   Antimicrobials:   Rocephin -b/c not taking pills yet   Subjective: Knows person but not oriented to time or place still.  Daughter at bedside reports patient is eating a little more but still not taking her pills.  No bowel movement yet x1 week  Objective: Vitals:   12/21/19 0034 12/21/19 0506 12/21/19 0822 12/21/19 1122  BP: (!) 166/53 (!) 152/64 (!) 117/98 (!) 165/59  Pulse: (!) 56 (!) 55 61 (!) 55  Resp: 18 16 20 16   Temp: (!) 97.5 F (36.4 C) 97.8 F (36.6 C) 97.7 F (36.5 C) 98.5 F (36.9 C)  TempSrc: Oral Axillary Oral Oral  SpO2: 92% 96%  98%  Weight:      Height:        Intake/Output Summary (Last 24 hours) at 12/21/2019 1436 Last data filed at 12/21/2019 1300 Gross per 24 hour  Intake 2626.7 ml  Output 1225 ml  Net 1401.7 ml   Filed Weights   12/18/19 1229  Weight: 81 kg    Examination: Pleasant, confused, NAD CTA, no wheeze rales rhonchi's Regular S1-S2 no murmurs rubs gallops Soft mildly tender at the lower abdomen/suprapubic area, nondistended, positive bowel sounds No edema bilaterally Awake and alert, oriented only to person Mood and affect appropriate in current setting    Data Reviewed: I have personally reviewed following labs and imaging studies  CBC: Recent Labs  Lab 12/18/19 1241 12/19/19 0351  WBC 8.6 8.9  NEUTROABS 7.2  --   HGB 10.9* 9.7*  HCT 32.4*  29.0*  MCV 84.2 85.5  PLT 178 157   Basic Metabolic Panel: Recent Labs  Lab 12/18/19 1241 12/19/19 0351  NA 139 141  K 3.4* 3.8  CL 105 109  CO2 25 26  GLUCOSE 116* 112*  BUN 18 17  CREATININE 1.02* 0.95  CALCIUM 10.4* 9.5   GFR: Estimated Creatinine Clearance: 49 mL/min (by C-G formula based on SCr of 0.95 mg/dL). Liver Function Tests: Recent Labs  Lab 12/18/19 1241  AST 14*  ALT 15  ALKPHOS 96  BILITOT 0.9  PROT 6.3*  ALBUMIN 3.2*   No results for input(s): LIPASE, AMYLASE in the last 168 hours. No results for input(s): AMMONIA in the last 168 hours. Coagulation Profile: No results for input(s): INR, PROTIME in the last 168 hours. Cardiac Enzymes: No results for input(s): CKTOTAL, CKMB, CKMBINDEX, TROPONINI in the last 168 hours. BNP (last 3 results) No results for input(s): PROBNP in the last 8760 hours. HbA1C: No results for input(s): HGBA1C in the last 72 hours. CBG: No results for input(s): GLUCAP in the last 168 hours. Lipid Profile: No results for input(s): CHOL, HDL, LDLCALC, TRIG, CHOLHDL, LDLDIRECT in the last 72 hours. Thyroid Function Tests: No results for input(s): TSH, T4TOTAL, FREET4, T3FREE, THYROIDAB in the last 72 hours. Anemia Panel: No results for input(s): VITAMINB12, FOLATE, FERRITIN, TIBC, IRON, RETICCTPCT in the last 72 hours. Sepsis Labs: Recent Labs  Lab 12/18/19  1252  LATICACIDVEN 1.3    Recent Results (from the past 240 hour(s))  Blood culture (routine x 2)     Status: None (Preliminary result)   Collection Time: 12/18/19 12:52 PM   Specimen: BLOOD  Result Value Ref Range Status   Specimen Description BLOOD LAC  Final   Special Requests   Final    BOTTLES DRAWN AEROBIC AND ANAEROBIC Blood Culture adequate volume   Culture   Final    NO GROWTH 3 DAYS Performed at [facility], [address] [place], [place] [address]    Report Status PENDING  Incomplete  Respiratory Panel by RT PCR (Flu A&B, Covid) - Urine,  Clean Catch     Status: None   Collection Time: 12/18/19 12:52 PM   Specimen: Urine, Clean Catch  Result Value Ref Range Status   SARS Coronavirus 2 by RT PCR NEGATIVE NEGATIVE Final    Comment: (NOTE) SARS-CoV-2 target nucleic acids are NOT DETECTED.  The SARS-CoV-2 RNA is generally detectable in upper respiratoy specimens during the acute phase of infection. The lowest concentration of SARS-CoV-2 viral copies this assay can detect is 131 copies/mL. A negative result does not preclude SARS-Cov-2 infection and should not be used as the sole basis for treatment or other patient management decisions. A negative result may occur with  improper specimen collection/handling, submission of specimen other than nasopharyngeal swab, presence of viral mutation(s) within the areas targeted by this assay, and inadequate number of viral copies (<131 copies/mL). A negative result must be combined with clinical observations, patient history, and epidemiological information. The expected result is Negative.  Fact Sheet for Patients:  https://www.moore.com/  Fact Sheet for Healthcare Providers:  https://www.young.biz/  This test is no t yet approved or cleared by the [place] FDA and  has been authorized for detection and/or diagnosis of SARS-CoV-2 by FDA under an Emergency Use Authorization (EUA). This EUA will remain  in effect (meaning this test can be used) for the duration of the COVID-19 declaration under Section 564(b)(1) of the Act, 21 U.S.C. section 360bbb-3(b)(1), unless the authorization is terminated or revoked sooner.     Influenza A by PCR NEGATIVE NEGATIVE Final   Influenza B by PCR NEGATIVE NEGATIVE Final    Comment: (NOTE) The Xpert Xpress SARS-CoV-2/FLU/RSV assay is intended as an aid in  the diagnosis of influenza from Nasopharyngeal swab specimens and  should not be used as a sole basis for treatment. Nasal washings and    aspirates are unacceptable for Xpert Xpress SARS-CoV-2/FLU/RSV  testing.  Fact Sheet for Patients: https://www.moore.com/  Fact Sheet for Healthcare Providers: https://www.young.biz/  This test is not yet approved or cleared by the [place] FDA and  has been authorized for detection and/or diagnosis of SARS-CoV-2 by  FDA under an Emergency Use Authorization (EUA). This EUA will remain  in effect (meaning this test can be used) for the duration of the  Covid-19 declaration under Section 564(b)(1) of the Act, 21  U.S.C. section 360bbb-3(b)(1), unless the authorization is  terminated or revoked. Performed at [facility], [address], [place], [place] [address]   Urine culture     Status: Abnormal   Collection Time: 12/18/19 12:52 PM   Specimen: Urine, Random  Result Value Ref Range Status   Specimen Description   Final    URINE, RANDOM Performed at [facility], [address] [place], [place] [address]    Special Requests   Final    NONE Performed  at [facility], [address] [place], [place] [address]    Culture >=100,000 COLONIES/mL ESCHERICHIA COLI (A)  Final   Report Status 12/21/2019 FINAL  Final   Organism ID, Bacteria ESCHERICHIA COLI (A)  Final      Susceptibility   Escherichia coli - MIC*    AMPICILLIN <=2 SENSITIVE Sensitive     CEFAZOLIN <=4 SENSITIVE Sensitive     CEFTRIAXONE <=0.25 SENSITIVE Sensitive     CIPROFLOXACIN <=0.25 SENSITIVE Sensitive     GENTAMICIN <=1 SENSITIVE Sensitive     IMIPENEM 0.5 SENSITIVE Sensitive     NITROFURANTOIN <=16 SENSITIVE Sensitive     TRIMETH/SULFA <=20 SENSITIVE Sensitive     AMPICILLIN/SULBACTAM <=2 SENSITIVE Sensitive     PIP/TAZO <=4 SENSITIVE Sensitive     * >=100,000 COLONIES/mL ESCHERICHIA COLI  Blood culture (routine x 2)     Status: None (Preliminary result)   Collection Time: 12/18/19 12:53 PM   Specimen: BLOOD  Result Value Ref  Range Status   Specimen Description BLOOD RIGHT HAND  Final   Special Requests   Final    BOTTLES DRAWN AEROBIC AND ANAEROBIC Blood Culture adequate volume   Culture   Final    NO GROWTH 3 DAYS Performed at [facility], [address], [place], [place] [address]    Report Status PENDING  Incomplete         Radiology Studies: No results found.      Scheduled Meds: . amLODipine  10 mg Oral QHS  . aspirin EC  81 mg Oral Daily  . docusate sodium  100 mg Oral Daily  . donepezil  5 mg Oral QHS  . enoxaparin (LOVENOX) injection  40 mg Subcutaneous Q24H  . influenza vaccine adjuvanted  0.5 mL Intramuscular Tomorrow-1000  . losartan  50 mg Oral Daily  . melatonin  2.5 mg Oral QHS  . polyethylene glycol  17 g Oral Daily  . QUEtiapine  50 mg Oral QHS  . senna  1 tablet Oral BID  . sertraline  100 mg Oral Daily  . vitamin B-12  2,000 mcg Oral Daily   Continuous Infusions: . sodium chloride 75 mL/hr at 12/21/19 1146  . cefTRIAXone (ROCEPHIN)  IV 1 g (12/21/19 1353)    Assessment & Plan:   Active Problems:   HTN (hypertension)   Major depression with psychotic features (HCC)   Mixed Alzheimer's and vascular dementia (HCC)   Gross hematuria   Nephrolithiasis   Hydronephrosis of right kidney   UTI (urinary tract infection)   Dementia with behavioral disturbance (HCC)   Hemorrhagic cystitis   1.Altered MS-present at admission Has baseline dementia per daughter it has gotten worse last few days.  Has not been taking her home meds and decreased p.o. intake. Likely 2/2 UTI.  Mildly improved but not at baseline per daughter who is at bedside Continue supportive care, reorientation, encourage p.o. intake We will continue IV fluids until her p.o. intake continues to improve and IV antibiotics   2.moderate severity right-sided hydronephrosis/Bilateral nonobstructing renal stones- Consulted and reported that the stone is too large to pass and may be getting larger  over time.  Recommended but patient did not appear to need any urgent intervention.  Urology recommended treatment but patient did not appear to need any urgent intervention.  May need ureteroscopy versus shockwave lithotripsy but family will consider.  Plan is to see patient as outpatient to plan therapy.   Patient will need to follow-up as outpatient with  urology on discharge.   Urology signed off on 10/3  3.UTI/hemorrhagic cystitis- no evidence of sepsis.no leukocystitis.  10/4-Ucx + E.coli,  Will continue to give IV antibiotics since patient is not taking her p.o. medications   4.Dementia-.on Aricept  5.HTN- stable, continue losartan, amlodipine Iv hydralazine   6.PT/OT-rec. SNF; 3 and 1 bedside commode  DVT prophylaxis: Lovenox Code Status: DNR Family Communication: Daughter at bedside  Status is: Inpatient Patient is inpatient status due to severity of illness, has mental status change and decrease po intake  Dispo: The patient is from: Home              Anticipated d/c is to: SNF              Anticipated d/c date is: 2 days              Patient currently is not medically stable to d/c. Needs SNF. Also not at baseline MS, still not fully taking po intake or her meds.          LOS: 2 days   Time spent: 35 min with >50% on coc    Lynn Ito, MD [facility] Pager [phone]  If 7PM-7AM, please contact night-coverage [email or web address] Password [email or web address] 12/21/2019, 2:36 PM

## 2019-12-22 DIAGNOSIS — N3091 Cystitis, unspecified with hematuria: Secondary | ICD-10-CM | POA: Diagnosis not present

## 2019-12-22 DIAGNOSIS — I10 Essential (primary) hypertension: Secondary | ICD-10-CM | POA: Diagnosis not present

## 2019-12-22 DIAGNOSIS — F0281 Dementia in other diseases classified elsewhere with behavioral disturbance: Secondary | ICD-10-CM | POA: Diagnosis not present

## 2019-12-22 DIAGNOSIS — G309 Alzheimer's disease, unspecified: Secondary | ICD-10-CM | POA: Diagnosis not present

## 2019-12-22 MED ORDER — BISACODYL 10 MG RE SUPP
10.0000 mg | Freq: Once | RECTAL | Status: AC
  Administered 2019-12-22: 10 mg via RECTAL
  Filled 2019-12-22: qty 1

## 2019-12-22 MED ORDER — LOSARTAN POTASSIUM 50 MG PO TABS
100.0000 mg | ORAL_TABLET | Freq: Every day | ORAL | Status: DC
  Administered 2019-12-23 – 2019-12-28 (×5): 100 mg via ORAL
  Filled 2019-12-22 (×5): qty 2

## 2019-12-22 MED ORDER — HYDRALAZINE HCL 25 MG PO TABS
25.0000 mg | ORAL_TABLET | Freq: Three times a day (TID) | ORAL | Status: DC | PRN
  Administered 2019-12-23: 25 mg via ORAL
  Filled 2019-12-22: qty 1

## 2019-12-22 MED ORDER — LORAZEPAM 2 MG/ML IJ SOLN
0.5000 mg | Freq: Once | INTRAMUSCULAR | Status: AC
  Administered 2019-12-22: 0.5 mg via INTRAVENOUS
  Filled 2019-12-22: qty 1

## 2019-12-22 NOTE — Progress Notes (Signed)
°   12/22/19 1300  Clinical Encounter Type  Visited With Patient and family together  Visit Type Follow-up  Referral From Chaplain  Consult/Referral To Chaplain  Chaplain checked back in Pt and family. When chaplain arrived Pt's daughter and husband were at bedside. Daughter left to go home to rest. Husband is staying until Pt's daughter-in-law arrives. Chaplain rubbed Pt's hand as she drifted off to sleep. Chaplain will follow up later.

## 2019-12-22 NOTE — Hospital Course (Addendum)
Summary from Dr. Kurtis Bushman 10/5: "Amanda Hancock is a 74 y.o. 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 She has known non-obstructing nephrolithiasis and prior episodes of hematuria due to cystitis."   Patient was treated for UTI / hemorrhagic cystitis with 5 day course of Rocephin.   10/3 to 10/8 - patient has had ongoing agitation, combativeness, minimal oral intake, refusing medications.  Family met with palliative care and have transitioned to comfort-focused care.    Patient is clinically stable for transfer to hospice today.

## 2019-12-22 NOTE — Progress Notes (Signed)
PROGRESS NOTE    Amanda Hancock   WCB:[id number]  DOB: Jul 27, 1945  PCP: Doreene Nest, NP    DOA: 12/18/2019 LOS: 3   Brief Narrative   Summary from Dr. Marylu Lund 10/5: "Amanda Hancock is a 74 y.o. female with medical history significant of dementia, HTN, nephrolithiasis presented to [facility] due to increased confusion from baseline and recurrent hematuria. She has known non-obstructing nephrolithiasis and prior episodes of hematuria due to cystitis.    10/3-daughter at bedside complaining patient has decreased p.o. intake and still mental status is not at baseline still confused."     Assessment & Plan   Active Problems:   HTN (hypertension)   Major depression with psychotic features (HCC)   Mixed Alzheimer's and vascular dementia (HCC)   Gross hematuria   Nephrolithiasis   Hydronephrosis of right kidney   UTI (urinary tract infection)   Dementia with behavioral disturbance (HCC)   Hemorrhagic cystitis   Altered mental status -present at admission - superimposed on baseline dementia.  Due to infection most likely. Family reported patient had not been taking her home meds at home and decreased p.o. intake.  Mildly improved but not at baseline per daughter. Continue supportive care, reorientation, encourage p.o. intake   Moderate severity right-sided hydronephrosis/Bilateral nonobstructing renal stones- Urology consulted and reported that the stone is too large to pass and may be getting larger over time, did not require any urgent intervention.  May need ureteroscopy versus shockwave lithotripsy but family will consider.  Plan is to see patient as outpatient to plan therapy.  Patient will need to follow-up as outpatient with urology on discharge.   Urology signed off on 10/3.  UTI/hemorrhagic cystitis- no evidence of sepsis, no leukocystitis. Urine cultures grew pan-sensitive E coli.   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  Continue home Aricept, Seroquel, Zoloft.  Followed by palliative at home.  Hypertension - poorly controlled on home amlodipine and losartan.  Will increase losartan to 100 mg daily. PO hydralazine PRN.  Generalized weakness - PT and OT recommending SNF 3-n-1.   Obesity: Body mass index is 34.88 kg/m.  Complicates overall care and prognosis.  DVT prophylaxis: enoxaparin (LOVENOX) injection 40 mg Start: 12/18/19 1845 SCDs Start: 12/18/19 1831   Diet:  Diet Orders (From admission, onward)    Start     Ordered   12/19/19 1730  DIET SOFT Room service appropriate? Yes; Fluid consistency: Thin  Diet effective now       Question Answer Comment  Room service appropriate? Yes   Fluid consistency: Thin      12/19/19 1729            Code Status: Full Code    Subjective 12/22/19    Patient seen with daughter at bedside this AM.  Pt complains of neck pain.  No other complaints or acute events.  Daughter concerned about patient's hallucinations and seeing people that have passed away.    Disposition Plan & Communication   Status is: Inpatient  Remains inpatient appropriate because:IV treatments appropriate due to intensity of illness or inability to take PO   Dispo: The patient is from: Home              Anticipated d/c is to: SNF              Anticipated d/c date is: 1 day              Patient  currently is not medically stable to d/c.     Family Communication: daughter at bedside on rounds    Consults, Procedures, Significant Events   Consultants:   Urology  Palliative Care    Antimicrobials:  Anti-infectives (From admission, onward)   Start     Dose/Rate Route Frequency Ordered Stop   12/19/19 1400  cefTRIAXone (ROCEPHIN) 1 g in sodium chloride 0.9 % 100 mL IVPB        1 g 200 mL/hr over 30 Minutes Intravenous Every 24 hours 12/19/19 1111 12/22/19 1349   12/19/19 0000  cefTRIAXone (ROCEPHIN) 1 g in sodium chloride 0.9 % 100 mL IVPB  Status:   Discontinued        1 g 200 mL/hr over 30 Minutes Intravenous Every 24 hours 12/18/19 1833 12/18/19 1834   12/18/19 2200  cefdinir (OMNICEF) capsule 300 mg  Status:  Discontinued        300 mg Oral Every 12 hours 12/18/19 1838 12/18/19 1900   12/18/19 2000  cefdinir (OMNICEF) capsule 300 mg  Status:  Discontinued        300 mg Oral Every 12 hours 12/18/19 1900 12/19/19 1111   12/18/19 1400  cefTRIAXone (ROCEPHIN) 1 g in sodium chloride 0.9 % 100 mL IVPB        1 g 200 mL/hr over 30 Minutes Intravenous  Once 12/18/19 1359 12/18/19 1726         Objective   Vitals:   12/21/19 2042 12/22/19 0337 12/22/19 0751 12/22/19 1228  BP: (!) 153/65 (!) 189/62 (!) 188/63 (!) 148/90  Pulse: (!) 57 (!) 59 (!) 55 77  Resp: 20 20 19    Temp: 98.2 F (36.8 C) 98 F (36.7 C) 97.6 F (36.4 C)   TempSrc: Oral Oral Oral   SpO2: 96% 97% 97% 97%  Weight:      Height:        Intake/Output Summary (Last 24 hours) at 12/22/2019 1406 Last data filed at 12/22/2019 0529 Gross per 24 hour  Intake --  Output 1000 ml  Net -1000 ml   Filed Weights   12/18/19 1229  Weight: 81 kg    Physical Exam:  General exam: awake, alert, no acute distress Respiratory system: CTAB, no wheezes, rales or rhonchi, normal respiratory effort. Cardiovascular system: normal S1/S2, RRR, no pedal edema.   Gastrointestinal system: soft, NT, ND, hypoactive bowel sounds. Central nervous system: oriented to self, no gross focal neurologic deficits, normal speech Extremities: moves all, no edema, normal tone Psychiatry: irritable mood, congruent affect, abnormal judgement and insight   Labs   Data Reviewed: I have personally reviewed following labs and imaging studies  CBC: Recent Labs  Lab 12/18/19 1241 12/19/19 0351  WBC 8.6 8.9  NEUTROABS 7.2  --   HGB 10.9* 9.7*  HCT 32.4* 29.0*  MCV 84.2 85.5  PLT 178 157   Basic Metabolic Panel: Recent Labs  Lab 12/18/19 1241 12/19/19 0351  NA 139 141  K 3.4* 3.8  CL  105 109  CO2 25 26  GLUCOSE 116* 112*  BUN 18 17  CREATININE 1.02* 0.95  CALCIUM 10.4* 9.5   GFR: Estimated Creatinine Clearance: 49 mL/min (by C-G formula based on SCr of 0.95 mg/dL). Liver Function Tests: Recent Labs  Lab 12/18/19 1241  AST 14*  ALT 15  ALKPHOS 96  BILITOT 0.9  PROT 6.3*  ALBUMIN 3.2*   No results for input(s): LIPASE, AMYLASE in the last 168 hours. No results for input(s): AMMONIA in  the last 168 hours. Coagulation Profile: No results for input(s): INR, PROTIME in the last 168 hours. Cardiac Enzymes: No results for input(s): CKTOTAL, CKMB, CKMBINDEX, TROPONINI in the last 168 hours. BNP (last 3 results) No results for input(s): PROBNP in the last 8760 hours. HbA1C: No results for input(s): HGBA1C in the last 72 hours. CBG: No results for input(s): GLUCAP in the last 168 hours. Lipid Profile: No results for input(s): CHOL, HDL, LDLCALC, TRIG, CHOLHDL, LDLDIRECT in the last 72 hours. Thyroid Function Tests: No results for input(s): TSH, T4TOTAL, FREET4, T3FREE, THYROIDAB in the last 72 hours. Anemia Panel: No results for input(s): VITAMINB12, FOLATE, FERRITIN, TIBC, IRON, RETICCTPCT in the last 72 hours. Sepsis Labs: Recent Labs  Lab 12/18/19 1252  LATICACIDVEN 1.3    Recent Results (from the past 240 hour(s))  Blood culture (routine x 2)     Status: None (Preliminary result)   Collection Time: 12/18/19 12:52 PM   Specimen: BLOOD  Result Value Ref Range Status   Specimen Description BLOOD LAC  Final   Special Requests   Final    BOTTLES DRAWN AEROBIC AND ANAEROBIC Blood Culture adequate volume   Culture   Final    NO GROWTH 4 DAYS Performed at [facility], [address], [place], [place] [address]    Report Status PENDING  Incomplete  Respiratory Panel by RT PCR (Flu A&B, Covid) - Urine, Clean Catch     Status: None   Collection Time: 12/18/19 12:52 PM   Specimen: Urine, Clean Catch  Result Value Ref Range Status   SARS  Coronavirus 2 by RT PCR NEGATIVE NEGATIVE Final    Comment: (NOTE) SARS-CoV-2 target nucleic acids are NOT DETECTED.  The SARS-CoV-2 RNA is generally detectable in upper respiratoy specimens during the acute phase of infection. The lowest concentration of SARS-CoV-2 viral copies this assay can detect is 131 copies/mL. A negative result does not preclude SARS-Cov-2 infection and should not be used as the sole basis for treatment or other patient management decisions. A negative result may occur with  improper specimen collection/handling, submission of specimen other than nasopharyngeal swab, presence of viral mutation(s) within the areas targeted by this assay, and inadequate number of viral copies (<131 copies/mL). A negative result must be combined with clinical observations, patient history, and epidemiological information. The expected result is Negative.  Fact Sheet for Patients:  https://www.moore.com/  Fact Sheet for Healthcare Providers:  https://www.young.biz/  This test is no t yet approved or cleared by the [place] FDA and  has been authorized for detection and/or diagnosis of SARS-CoV-2 by FDA under an Emergency Use Authorization (EUA). This EUA will remain  in effect (meaning this test can be used) for the duration of the COVID-19 declaration under Section 564(b)(1) of the Act, 21 U.S.C. section 360bbb-3(b)(1), unless the authorization is terminated or revoked sooner.     Influenza A by PCR NEGATIVE NEGATIVE Final   Influenza B by PCR NEGATIVE NEGATIVE Final    Comment: (NOTE) The Xpert Xpress SARS-CoV-2/FLU/RSV assay is intended as an aid in  the diagnosis of influenza from Nasopharyngeal swab specimens and  should not be used as a sole basis for treatment. Nasal washings and  aspirates are unacceptable for Xpert Xpress SARS-CoV-2/FLU/RSV  testing.  Fact Sheet for  Patients: https://www.moore.com/  Fact Sheet for Healthcare Providers: https://www.young.biz/  This test is not yet approved or cleared by the [place] FDA and  has been authorized for detection and/or diagnosis of SARS-CoV-2 by  FDA under an Emergency Use Authorization (EUA). This EUA will remain  in effect (meaning this test can be used) for the duration of the  Covid-19 declaration under Section 564(b)(1) of the Act, 21  U.S.C. section 360bbb-3(b)(1), unless the authorization is  terminated or revoked. Performed at [facility], [address] [place], [place] [address]   Urine culture     Status: Abnormal   Collection Time: 12/18/19 12:52 PM   Specimen: Urine, Random  Result Value Ref Range Status   Specimen Description   Final    URINE, RANDOM Performed at [facility], [address] [place], [place] [address]    Special Requests   Final    NONE Performed at [facility] [address] [place], [place] [address]    Culture >=100,000 COLONIES/mL ESCHERICHIA COLI (A)  Final   Report Status 12/21/2019 FINAL  Final   Organism ID, Bacteria ESCHERICHIA COLI (A)  Final      Susceptibility   Escherichia coli - MIC*    AMPICILLIN <=2 SENSITIVE Sensitive     CEFAZOLIN <=4 SENSITIVE Sensitive     CEFTRIAXONE <=0.25 SENSITIVE Sensitive     CIPROFLOXACIN <=0.25 SENSITIVE Sensitive     GENTAMICIN <=1 SENSITIVE Sensitive     IMIPENEM 0.5 SENSITIVE Sensitive     NITROFURANTOIN <=16 SENSITIVE Sensitive     TRIMETH/SULFA <=20 SENSITIVE Sensitive     AMPICILLIN/SULBACTAM <=2 SENSITIVE Sensitive     PIP/TAZO <=4 SENSITIVE Sensitive     * >=100,000 COLONIES/mL ESCHERICHIA COLI  Blood culture (routine x 2)     Status: None (Preliminary result)   Collection Time: 12/18/19 12:53 PM   Specimen: BLOOD  Result Value Ref Range Status   Specimen Description BLOOD RIGHT HAND  Final   Special Requests   Final     BOTTLES DRAWN AEROBIC AND ANAEROBIC Blood Culture adequate volume   Culture   Final    NO GROWTH 4 DAYS Performed at [facility], [address], [place], [place] [address]    Report Status PENDING  Incomplete      Imaging Studies   No results found.   Medications   Scheduled Meds: . acidophilus  2 capsule Oral TID  . amLODipine  10 mg Oral QHS  . aspirin EC  81 mg Oral Daily  . docusate sodium  100 mg Oral Daily  . donepezil  5 mg Oral QHS  . enoxaparin (LOVENOX) injection  40 mg Subcutaneous Q24H  . influenza vaccine adjuvanted  0.5 mL Intramuscular Tomorrow-1000  . [START ON 12/23/2019] losartan  100 mg Oral Daily  . melatonin  2.5 mg Oral QHS  . polyethylene glycol  17 g Oral Daily  . QUEtiapine  50 mg Oral QHS  . senna  1 tablet Oral BID  . sertraline  100 mg Oral Daily  . vitamin B-12  2,000 mcg Oral Daily   Continuous Infusions:     LOS: 3 days    Time spent: 30 minutes    Pennie Banter, DO [facility]  12/22/2019, 2:06 PM    If 7PM-7AM, please contact night-coverage. How to contact the [facility] Attending or Consulting provider 7A - 7P or covering provider during after hours 7P -7A, for this patient?    1. Check the care team in [facility] and look for a) attending/consulting TRH provider listed and b) the [facility] team listed 2. Log into [email or web address] and use [facility] universal password to access. If you do not  have the password, please contact the hospital operator. 3. Locate the [facility] provider you are looking for under [facility] and page to a number that you can be directly reached. 4. If you still have difficulty reaching the provider, please page the [facility] (Director on Call) for the Hospitalists listed on amion for assistance.

## 2019-12-22 NOTE — Progress Notes (Signed)
Patient is followed by [facility] at home. Patient does not have a MOST form or clear documentation of goals of care- other than they desire to keep her at home but it has taken a toll on her husband and they also have financial challenges. She has progressive Lewy Body Dementia which has a very difficult disease trajectory especially in the late stages and at end of life. LBD has associated severe and challenging symptom management with high incidence of adverse reactions to many of the medications typically used for sedation and psychosis. Inpatient Palliative Care is available for consultation during her inpatient stay. Please place order for palliative consultation if we can assist with the care of this patient. I will notify [facility] of her admission- she may qualify for hospice services at this point.  Anderson Malta, DO Palliative Medicine

## 2019-12-22 NOTE — TOC Progression Note (Addendum)
Transition of Care [facility]) - Progression Note    Patient Details  Name: Amanda Hancock MRN: [id number] Date of Birth: 08/28/1945  Transition of Care [facility]) CM/SW Contact  Margarito Liner, LCSW Phone Number: 12/22/2019, 12:29 PM  Clinical Narrative: Uploaded requested documents into [employer] for PASARR review. Gave patient's daughter list of bed offers, those that are still pending, and those that have declined.    1:00 pm: PASARR obtained: [id number]. Expires 11/5.  Expected Discharge Plan: Skilled Nursing Facility Barriers to Discharge: Continued Medical Work up, Designer, jewellery (PASRR), Insurance Authorization  Expected Discharge Plan and Services Expected Discharge Plan: Skilled Nursing Facility     Post Acute Care Choice: Skilled Nursing Facility Living arrangements for the past 2 months: Single Family Home                                       Social Determinants of Health (SDOH) Interventions    Readmission Risk Interventions No flowsheet data found.

## 2019-12-22 NOTE — Progress Notes (Addendum)
   12/22/19 0845  Clinical Encounter Type  Visited With Patient  Visit Type Follow-up  Referral From Chaplain;Nurse  Consult/Referral To Chaplain  Chaplain responded to OR because Pt requested prayer and wanted to talk to a preacher. Chaplain visited with Pt and daughter for almost an hour. Pt was talking seeing her grandpa, Grandmother, and mother. Daughter said Pt had an episode where she said she wanted to go be with her deceased family members and Jesus. This upset her daughter very much. Today, Chaplain assisted daughter with trying to get Pt to take her meds. Chaplain will follow up with Pt when her husband comes to visit. Hopefully chaplain's presence will lighten the load.

## 2019-12-22 NOTE — Progress Notes (Signed)
Occupational Therapy Treatment Patient Details Name: Amanda Hancock MRN: [id number] DOB: 04/03/1945 Today's Date: 12/22/2019    History of present illness Pt is a 74 y.o. female with medical history significant of dementia, HTN, nephrolithiasis presented to [facility] due to increased confusion from baseline and recurrent hematuria. She has known non-obstructing nephrolithiasis and prior episodes of hematuria due to cystitis.   OT comments  Pt seen for OT treatment on this date. Pt very lethargic, paranoid ("people are trying to hurt me."), agitated, and labile (bursting into tears at one point). Required Max A to come to EOB sitting and return. Pt able to comb her hair IND'ly, however, was unable to take a drink herself -- OT had to hold cup and direct straw to pt's mouth. Pt then abruptly fell asleep. Daughter-in-law present in room and reports that pt is "having a very bad day." Pt left in bed, alarm activited, and DIL in room. Pt continues to benefit from skilled OT services to  minimize risk of future falls, injury, caregiver burden, and readmission. Will continue to follow POC. Discharge recommendation remains appropriate.    Follow Up Recommendations  SNF;Supervision/Assistance - 24 hour    Equipment Recommendations       Recommendations for Other Services      Precautions / Restrictions         Mobility Bed Mobility Overal bed mobility: Needs Assistance Bed Mobility: Supine to Sit;Sit to Supine     Supine to sit: HOB elevated;Max assist Sit to supine: Max assist      Transfers                      Balance                                           ADL either performed or assessed with clinical judgement   ADL Overall ADL's : Needs assistance/impaired Eating/Feeding: Moderate assistance   Grooming: Minimal assistance                                       Vision       Perception     Praxis      Cognition  Arousal/Alertness: Awake/alert Behavior During Therapy: Flat affect;Agitated Overall Cognitive Status: History of cognitive impairments - at baseline                                 General Comments: Pt disoriented, confused, paranoid        Exercises Other Exercises Other Exercises: Bed mobility, self-feeding, grooming, reorientation   Shoulder Instructions       General Comments      Pertinent Vitals/ Pain       Pain Assessment: No/denies pain  Home Living                                          Prior Functioning/Environment              Frequency  Min 1X/week        Progress Toward Goals  OT Goals(current goals can now be found in the care plan  section)  Progress towards OT goals: Progressing toward goals  Acute Rehab OT Goals Patient Stated Goal: return to PLOF as able OT Goal Formulation: With patient/family Time For Goal Achievement: 01/03/20 Potential to Achieve Goals: Fair  Plan Discharge plan remains appropriate    Co-evaluation                 AM-PAC OT "6 Clicks" Daily Activity     Outcome Measure   Help from another person eating meals?: A Lot Help from another person taking care of personal grooming?: A Little Help from another person toileting, which includes using toliet, bedpan, or urinal?: A Lot Help from another person bathing (including washing, rinsing, drying)?: A Lot Help from another person to put on and taking off regular upper body clothing?: A Lot Help from another person to put on and taking off regular lower body clothing?: A Lot 6 Click Score: 13    End of Session    OT Visit Diagnosis: Other abnormalities of gait and mobility (R26.89);Other symptoms and signs involving cognitive function;Repeated falls (R29.6)   Activity Tolerance Patient limited by lethargy   Patient Left in bed;with call bell/phone within reach;with family/visitor present   Nurse Communication           Time: 1541-1550 OT Time Calculation (min): 9 min  Charges: OT General Charges $OT Visit: 1 Visit OT Treatments $Self Care/Home Management : 8-22 mins  Latina Craver, PhD, MS, OTR/L ascom [phone] 12/22/19, 4:45 PM

## 2019-12-22 NOTE — Care Management Important Message (Signed)
Important Message  Patient Details  Name: MARKIYA KEEFE MRN: [id number] Date of Birth: [birth year]   Medicare Important Message Given:  Yes     [name] 12/22/2019, 11:30 AM

## 2019-12-23 DIAGNOSIS — N1330 Unspecified hydronephrosis: Secondary | ICD-10-CM | POA: Diagnosis not present

## 2019-12-23 DIAGNOSIS — F0281 Dementia in other diseases classified elsewhere with behavioral disturbance: Secondary | ICD-10-CM | POA: Diagnosis not present

## 2019-12-23 DIAGNOSIS — G309 Alzheimer's disease, unspecified: Secondary | ICD-10-CM

## 2019-12-23 DIAGNOSIS — N3091 Cystitis, unspecified with hematuria: Secondary | ICD-10-CM | POA: Diagnosis not present

## 2019-12-23 DIAGNOSIS — F323 Major depressive disorder, single episode, severe with psychotic features: Secondary | ICD-10-CM | POA: Diagnosis not present

## 2019-12-23 LAB — CULTURE, BLOOD (ROUTINE X 2)
Culture: NO GROWTH
Culture: NO GROWTH
Special Requests: ADEQUATE
Special Requests: ADEQUATE

## 2019-12-23 LAB — CBC
HCT: 31.1 % — ABNORMAL LOW (ref 36.0–46.0)
Hemoglobin: 10.7 g/dL — ABNORMAL LOW (ref 12.0–15.0)
MCH: 28.7 pg (ref 26.0–34.0)
MCHC: 34.4 g/dL (ref 30.0–36.0)
MCV: 83.4 fL (ref 80.0–100.0)
Platelets: 165 10*3/uL (ref 150–400)
RBC: 3.73 MIL/uL — ABNORMAL LOW (ref 3.87–5.11)
RDW: 13.5 % (ref 11.5–15.5)
WBC: 4.9 10*3/uL (ref 4.0–10.5)
nRBC: 0 % (ref 0.0–0.2)

## 2019-12-23 LAB — BASIC METABOLIC PANEL
Anion gap: 7 (ref 5–15)
BUN: 9 mg/dL (ref 8–23)
CO2: 28 mmol/L (ref 22–32)
Calcium: 10 mg/dL (ref 8.9–10.3)
Chloride: 108 mmol/L (ref 98–111)
Creatinine, Ser: 0.68 mg/dL (ref 0.44–1.00)
GFR calc non Af Amer: >60 mL/min (ref >60)
Glucose, Bld: 99 mg/dL (ref 70–99)
Potassium: 3.8 mmol/L (ref 3.5–5.1)
Sodium: 143 mmol/L (ref 135–145)

## 2019-12-23 MED ORDER — QUETIAPINE FUMARATE 25 MG PO TABS
50.0000 mg | ORAL_TABLET | Freq: Every day | ORAL | Status: DC
  Administered 2019-12-23 – 2019-12-27 (×5): 50 mg via ORAL
  Filled 2019-12-23 (×5): qty 2

## 2019-12-23 MED ORDER — PHENAZOPYRIDINE HCL 100 MG PO TABS
100.0000 mg | ORAL_TABLET | Freq: Three times a day (TID) | ORAL | Status: DC
  Administered 2019-12-23 – 2019-12-26 (×8): 100 mg via ORAL
  Filled 2019-12-23 (×14): qty 1

## 2019-12-23 MED ORDER — ENSURE ENLIVE PO LIQD
237.0000 mL | Freq: Three times a day (TID) | ORAL | Status: DC
  Administered 2019-12-23 – 2019-12-25 (×6): 237 mL via ORAL

## 2019-12-23 MED ORDER — QUETIAPINE FUMARATE 25 MG PO TABS
50.0000 mg | ORAL_TABLET | Freq: Two times a day (BID) | ORAL | Status: DC
  Administered 2019-12-23: 50 mg via ORAL
  Filled 2019-12-23: qty 2

## 2019-12-23 MED ORDER — HALOPERIDOL LACTATE 2 MG/ML PO CONC
1.0000 mg | Freq: Four times a day (QID) | ORAL | Status: DC | PRN
  Filled 2019-12-23: qty 0.5

## 2019-12-23 MED ORDER — ARIPIPRAZOLE 2 MG PO TABS
1.0000 mg | ORAL_TABLET | Freq: Every day | ORAL | Status: DC
  Administered 2019-12-23 – 2019-12-28 (×6): 1 mg via ORAL
  Filled 2019-12-23 (×6): qty 1
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
## 2019-12-23 NOTE — Progress Notes (Signed)
[facility] hospital Liaison note:  Patient is currently followed by [facility] community [facility] at home.  TOC Charlynn Court is aware. Will continue to follow for disposition. Dayna Barker BSN RN, [facility] Liaison [facility] [phone]

## 2019-12-23 NOTE — Progress Notes (Signed)
PROGRESS NOTE    Amanda Hancock   MEQ:[id number]  DOB: 02-Apr-1945  PCP: Doreene Nest, NP    DOA: 12/18/2019 LOS: 4   Brief Narrative   Summary from Dr. Marylu Lund 10/5: "Amanda Hancock is a 74 y.o. female with medical history significant of dementia, HTN, nephrolithiasis presented to [facility] due to increased confusion from baseline and recurrent hematuria. She has known non-obstructing nephrolithiasis and prior episodes of hematuria due to cystitis.    10/3-daughter at bedside complaining patient has decreased p.o. intake and still mental status is not at baseline still confused."     Assessment & Plan   Principal Problem:   Dementia with behavioral disturbance (HCC) Active Problems:   HTN (hypertension)   Major depression with psychotic features (HCC)   Mixed Alzheimer's and vascular dementia (HCC)   Gross hematuria   Nephrolithiasis   Hydronephrosis of right kidney   UTI (urinary tract infection)   Hemorrhagic cystitis   Altered mental status -present at admission - superimposed on baseline dementia.  Due to infection most likely, hospital delirium contributing now that infection has been treated.   Continue supportive care, reorientation, encourage p.o. intake --Delirium precautions:     -Lights and TV off, minimize interruptions at night    -Blinds open and lights on during day    -Glasses/hearing aid with patient    -Frequent reorientation    -PT/OT when able    -Avoid sedation medications/Beers list medications --other mgmt as below for dementia  Moderate severity right-sided hydronephrosis/Bilateral nonobstructing renal stones- Urology consulted and reported that the stone is too large to pass and may be getting larger over time, did not require any urgent intervention.  May need ureteroscopy versus shockwave lithotripsy but family will consider.  Plan is to see patient as outpatient to plan therapy.  Patient will need to follow-up as outpatient with urology  on discharge.   Urology signed off on 10/3.  UTI/hemorrhagic cystitis- no evidence of sepsis, no leukocystitis. Urine cultures grew pan-sensitive E coli.   --Continue Rocephin- last of 5 day course --d/c IV fluids   Dementia (Alzheimer's, Vascular, ?Lewy Body) - with behavioral disturbances (agitation, combativeness).   --Continue home Aricept, Seroquel, Zoloft.   --Followed by palliative at home. --Psychiatry consulted for medication recommendations --Added Abilify 1mg  qAM --IV Haldol PRN --Avoid benzos - worsens agitation  Hypertension - poorly controlled on home amlodipine and losartan.  Will increase losartan to 100 mg daily. PO hydralazine PRN.  Generalized weakness - PT and OT recommending SNF 3-n-1.   Obesity: Body mass index is 34.88 kg/m.  Complicates overall care and prognosis.  DVT prophylaxis: enoxaparin (LOVENOX) injection 40 mg Start: 12/18/19 1845 SCDs Start: 12/18/19 1831   Diet:  Diet Orders (From admission, onward)    Start     Ordered   12/19/19 1730  DIET SOFT Room service appropriate? Yes; Fluid consistency: Thin  Diet effective now       Question Answer Comment  Room service appropriate? Yes   Fluid consistency: Thin      12/19/19 1729            Code Status: Full Code    Subjective 12/23/19    Patient seen with daughter at bedside this AM.  Nursing reports patient very combative this morning, swinging fists and difficulty giving meds.  Daughter at bedside was able to help control her aggression.  When I saw patient, she was calm and allowed physical exam without issues.  She denies any  acute complaints.  Daughter quite concerned about behavioral issues getting worse.  We discussed psych consult, she is agreeable.    Disposition Plan & Communication   Status is: Inpatient  Remains inpatient appropriate because:IV treatments appropriate due to intensity of illness or inability to take PO.     Dispo: The patient is from: Home               Anticipated d/c is to: SNF              Anticipated d/c date is: 1-2 days              Patient currently is not medically stable to d/c.     Family Communication: daughter at bedside on rounds    Consults, Procedures, Significant Events   Consultants:   Urology  Palliative Care  Psychiatry    Antimicrobials:  Anti-infectives (From admission, onward)   Start     Dose/Rate Route Frequency Ordered Stop   12/19/19 1400  cefTRIAXone (ROCEPHIN) 1 g in sodium chloride 0.9 % 100 mL IVPB        1 g 200 mL/hr over 30 Minutes Intravenous Every 24 hours 12/19/19 1111 12/22/19 1537   12/19/19 0000  cefTRIAXone (ROCEPHIN) 1 g in sodium chloride 0.9 % 100 mL IVPB  Status:  Discontinued        1 g 200 mL/hr over 30 Minutes Intravenous Every 24 hours 12/18/19 1833 12/18/19 1834   12/18/19 2200  cefdinir (OMNICEF) capsule 300 mg  Status:  Discontinued        300 mg Oral Every 12 hours 12/18/19 1838 12/18/19 1900   12/18/19 2000  cefdinir (OMNICEF) capsule 300 mg  Status:  Discontinued        300 mg Oral Every 12 hours 12/18/19 1900 12/19/19 1111   12/18/19 1400  cefTRIAXone (ROCEPHIN) 1 g in sodium chloride 0.9 % 100 mL IVPB        1 g 200 mL/hr over 30 Minutes Intravenous  Once 12/18/19 1359 12/18/19 1726         Objective   Vitals:   12/22/19 1919 12/23/19 0611 12/23/19 0800 12/23/19 1147  BP: (!) 168/56 (!) 162/64 (!) 180/68 (!) 146/61  Pulse: 69 60 (!) 54 66  Resp: 20 20  20   Temp: 98.1 F (36.7 C) 97.7 F (36.5 C) 97.8 F (36.6 C) (!) 97.5 F (36.4 C)  TempSrc: Oral Oral Axillary Oral  SpO2:  98% 96% 98%  Weight:      Height:        Intake/Output Summary (Last 24 hours) at 12/23/2019 1316 Last data filed at 12/23/2019 1021 Gross per 24 hour  Intake 0 ml  Output --  Net 0 ml   Filed Weights   12/18/19 1229  Weight: 81 kg    Physical Exam:  General exam: awake, alert, no acute distress Respiratory system: CTAB, no wheezes, rales or rhonchi, normal  respiratory effort. Cardiovascular system: normal S1/S2, RRR, no pedal edema.   Gastrointestinal system: soft, NT, ND Central nervous system: not oriented, no gross focal neurologic deficits, normal speech Extremities: moves all, no edema, normal tone Psychiatry: irritable mood, congruent affect, abnormal judgement and insight   Labs   Data Reviewed: I have personally reviewed following labs and imaging studies  CBC: Recent Labs  Lab 12/18/19 1241 12/19/19 0351 12/23/19 0523  WBC 8.6 8.9 4.9  NEUTROABS 7.2  --   --   HGB 10.9* 9.7* 10.7*  HCT 32.4* 29.0*  31.1*  MCV 84.2 85.5 83.4  PLT 178 157 165   Basic Metabolic Panel: Recent Labs  Lab 12/18/19 1241 12/19/19 0351 12/23/19 0523  NA 139 141 143  K 3.4* 3.8 3.8  CL 105 109 108  CO2 25 26 28   GLUCOSE 116* 112* 99  BUN 18 17 9   CREATININE 1.02* 0.95 0.68  CALCIUM 10.4* 9.5 10.0   GFR: Estimated Creatinine Clearance: 58.1 mL/min (by C-G formula based on SCr of 0.68 mg/dL). Liver Function Tests: Recent Labs  Lab 12/18/19 1241  AST 14*  ALT 15  ALKPHOS 96  BILITOT 0.9  PROT 6.3*  ALBUMIN 3.2*   No results for input(s): LIPASE, AMYLASE in the last 168 hours. No results for input(s): AMMONIA in the last 168 hours. Coagulation Profile: No results for input(s): INR, PROTIME in the last 168 hours. Cardiac Enzymes: No results for input(s): CKTOTAL, CKMB, CKMBINDEX, TROPONINI in the last 168 hours. BNP (last 3 results) No results for input(s): PROBNP in the last 8760 hours. HbA1C: No results for input(s): HGBA1C in the last 72 hours. CBG: No results for input(s): GLUCAP in the last 168 hours. Lipid Profile: No results for input(s): CHOL, HDL, LDLCALC, TRIG, CHOLHDL, LDLDIRECT in the last 72 hours. Thyroid Function Tests: No results for input(s): TSH, T4TOTAL, FREET4, T3FREE, THYROIDAB in the last 72 hours. Anemia Panel: No results for input(s): VITAMINB12, FOLATE, FERRITIN, TIBC, IRON, RETICCTPCT in the last 72  hours. Sepsis Labs: Recent Labs  Lab 12/18/19 1252  LATICACIDVEN 1.3    Recent Results (from the past 240 hour(s))  Blood culture (routine x 2)     Status: None   Collection Time: 12/18/19 12:52 PM   Specimen: BLOOD  Result Value Ref Range Status   Specimen Description BLOOD LAC  Final   Special Requests   Final    BOTTLES DRAWN AEROBIC AND ANAEROBIC Blood Culture adequate volume   Culture   Final    NO GROWTH 5 DAYS Performed at [facility], [address] [place], [place] [address]    Report Status 12/23/2019 FINAL  Final  Respiratory Panel by RT PCR (Flu A&B, Covid) - Urine, Clean Catch     Status: None   Collection Time: 12/18/19 12:52 PM   Specimen: Urine, Clean Catch  Result Value Ref Range Status   SARS Coronavirus 2 by RT PCR NEGATIVE NEGATIVE Final    Comment: (NOTE) SARS-CoV-2 target nucleic acids are NOT DETECTED.  The SARS-CoV-2 RNA is generally detectable in upper respiratoy specimens during the acute phase of infection. The lowest concentration of SARS-CoV-2 viral copies this assay can detect is 131 copies/mL. A negative result does not preclude SARS-Cov-2 infection and should not be used as the sole basis for treatment or other patient management decisions. A negative result may occur with  improper specimen collection/handling, submission of specimen other than nasopharyngeal swab, presence of viral mutation(s) within the areas targeted by this assay, and inadequate number of viral copies (<131 copies/mL). A negative result must be combined with clinical observations, patient history, and epidemiological information. The expected result is Negative.  Fact Sheet for Patients:  https://www.moore.com/https://www.fda.gov/media/142436/download  Fact Sheet for Healthcare Providers:  https://www.young.biz/https://www.fda.gov/media/142435/download  This test is no t yet approved or cleared by the [place] FDA and  has been authorized for detection and/or diagnosis of SARS-CoV-2 by FDA  under an Emergency Use Authorization (EUA). This EUA will remain  in effect (meaning this test can be used) for the duration of the COVID-19  declaration under Section 564(b)(1) of the Act, 21 U.S.C. section 360bbb-3(b)(1), unless the authorization is terminated or revoked sooner.     Influenza A by PCR NEGATIVE NEGATIVE Final   Influenza B by PCR NEGATIVE NEGATIVE Final    Comment: (NOTE) The Xpert Xpress SARS-CoV-2/FLU/RSV assay is intended as an aid in  the diagnosis of influenza from Nasopharyngeal swab specimens and  should not be used as a sole basis for treatment. Nasal washings and  aspirates are unacceptable for Xpert Xpress SARS-CoV-2/FLU/RSV  testing.  Fact Sheet for Patients: https://www.moore.com/  Fact Sheet for Healthcare Providers: https://www.young.biz/  This test is not yet approved or cleared by the [place] FDA and  has been authorized for detection and/or diagnosis of SARS-CoV-2 by  FDA under an Emergency Use Authorization (EUA). This EUA will remain  in effect (meaning this test can be used) for the duration of the  Covid-19 declaration under Section 564(b)(1) of the Act, 21  U.S.C. section 360bbb-3(b)(1), unless the authorization is  terminated or revoked. Performed at [facility], [address] [place], [place] [address]   Urine culture     Status: Abnormal   Collection Time: 12/18/19 12:52 PM   Specimen: Urine, Random  Result Value Ref Range Status   Specimen Description   Final    URINE, RANDOM Performed at [facility], [address] [place], [place] [address]    Special Requests   Final    NONE Performed at [facility], [address] [place], [place] [address]    Culture >=100,000 COLONIES/mL ESCHERICHIA COLI (A)  Final   Report Status 12/21/2019 FINAL  Final   Organism ID, Bacteria ESCHERICHIA COLI (A)  Final      Susceptibility   Escherichia coli - MIC*     AMPICILLIN <=2 SENSITIVE Sensitive     CEFAZOLIN <=4 SENSITIVE Sensitive     CEFTRIAXONE <=0.25 SENSITIVE Sensitive     CIPROFLOXACIN <=0.25 SENSITIVE Sensitive     GENTAMICIN <=1 SENSITIVE Sensitive     IMIPENEM 0.5 SENSITIVE Sensitive     NITROFURANTOIN <=16 SENSITIVE Sensitive     TRIMETH/SULFA <=20 SENSITIVE Sensitive     AMPICILLIN/SULBACTAM <=2 SENSITIVE Sensitive     PIP/TAZO <=4 SENSITIVE Sensitive     * >=100,000 COLONIES/mL ESCHERICHIA COLI  Blood culture (routine x 2)     Status: None   Collection Time: 12/18/19 12:53 PM   Specimen: BLOOD  Result Value Ref Range Status   Specimen Description BLOOD RIGHT HAND  Final   Special Requests   Final    BOTTLES DRAWN AEROBIC AND ANAEROBIC Blood Culture adequate volume   Culture   Final    NO GROWTH 5 DAYS Performed at [facility], [address], [place], [place] [address]    Report Status 12/23/2019 FINAL  Final      Imaging Studies   No results found.   Medications   Scheduled Meds:  acidophilus  2 capsule Oral TID   amLODipine  10 mg Oral QHS   ARIPiprazole  1 mg Oral Daily   aspirin EC  81 mg Oral Daily   docusate sodium  100 mg Oral Daily   donepezil  5 mg Oral QHS   enoxaparin (LOVENOX) injection  40 mg Subcutaneous Q24H   influenza vaccine adjuvanted  0.5 mL Intramuscular Tomorrow-1000   losartan  100 mg Oral Daily   melatonin  2.5 mg Oral QHS   phenazopyridine  100 mg Oral TID WC  polyethylene glycol  17 g Oral Daily   QUEtiapine  50 mg Oral QHS   senna  1 tablet Oral BID   sertraline  100 mg Oral Daily   vitamin B-12  2,000 mcg Oral Daily   Continuous Infusions:     LOS: 4 days    Time spent: 30 minutes    Pennie Banter, DO [facility]  12/23/2019, 1:16 PM    If 7PM-7AM, please contact night-coverage. How to contact the [facility] Attending or Consulting provider 7A - 7P or covering provider during after hours 7P -7A, for this patient?    1. Check the care  team in [facility] and look for a) attending/consulting TRH provider listed and b) the [facility] team listed 2. Log into [email or web address] and use [place]'s universal password to access. If you do not have the password, please contact the hospital operator. 3. Locate the [facility] provider you are looking for under [facility] and page to a number that you can be directly reached. 4. If you still have difficulty reaching the provider, please page the [facility] (Director on Call) for the Hospitalists listed on amion for assistance.

## 2019-12-23 NOTE — TOC Progression Note (Signed)
Transition of Care [facility]) - Progression Note    Patient Details  Name: Amanda Hancock MRN: [id number] Date of Birth: May 25, 1945  Transition of Care [facility]) CM/SW [place], LCSW Phone Number: 12/23/2019, 11:45 AM  Clinical Narrative: CSW met with daughter. She said they were making some medication adjustments to hopefully improve her aggressiveness, hallucinations, etc. She's hoping that patient will have bed offers at SNF's closer to home once we see if her behaviors improve with these medications. Psych has been consulted.  Expected Discharge Plan: Skilled Nursing Facility Barriers to Discharge: Continued Medical Work up, Programmer, applications ([place]), Insurance Authorization  Expected Discharge Plan and Services Expected Discharge Plan: [place] Choice: [place] arrangements for the past 2 months: Single Family Home                                       Social Determinants of Health (SDOH) Interventions    Readmission Risk Interventions No flowsheet data found.

## 2019-12-23 NOTE — Progress Notes (Signed)
Physical Therapy Treatment Patient Details Name: Amanda Hancock MRN: [id number] DOB: 1945-06-24 Today's Date: 12/23/2019    History of Present Illness Pt is a 74 y.o. female with medical history significant of dementia, HTN, nephrolithiasis presented to [facility] due to increased confusion from baseline and recurrent hematuria. She has known non-obstructing nephrolithiasis and prior episodes of hematuria due to cystitis.    PT Comments    Patient has intermittent agitation during session and is combative x 1 bout, attempting to kick therapist when getting back to bed. Patient is able to participate with therapy efforts with maximal cues for attention to task, redirection, extra time. Daughter present throughout session. Patient needs assistance for bed mobility and is able to stand x 4 bouts with minimal assistance. Standing tolerance is around one minute with each standing bout. Did not feel that it was safe to progress to ambulation due to distraction, participation, intermittent agitation. Patient does appear to have generalized weakness. Recommend to continue PT to address functional limitations remaining.    Follow Up Recommendations  SNF     Equipment Recommendations  None recommended by PT    Recommendations for Other Services       Precautions / Restrictions Precautions Precautions: Fall Precaution Comments: agitation/combative at times  Restrictions Weight Bearing Restrictions: No    Mobility  Bed Mobility Overal bed mobility: Needs Assistance Bed Mobility: Supine to Sit;Sit to Supine     Supine to sit: Hancock assist Sit to supine: Mod assist   General bed mobility comments: intermittent trunk assistance provided for supine to sitting. increased assistance required for return to bed for LE support. verbal cues for safety and technique   Transfers Overall transfer level: Needs assistance   Transfers: Sit to/from Stand Sit to Stand: Hancock assist         General  transfer comment: 4 standing bouts performed. patient needs cues for attention to task. occasional steadying assistance provided.   Ambulation/Gait             General Gait Details: did not progress walking due to safety concerns with standing and ability for sustained participation as patient is easily distracted during mobility efforts    Stairs             Wheelchair Mobility    Modified Rankin (Stroke Patients Only)       Balance Overall balance assessment: Needs assistance Sitting-balance support: Feet supported;No upper extremity supported Sitting balance-Leahy Scale: Good Sitting balance - Comments: needs supervision for safety concerns with cognition, not for balance deficits    Standing balance support: No upper extremity supported;During functional activity Standing balance-Leahy Scale: Fair Standing balance comment: patient able to stand in 1 minute bouts x 4 bouts. patient does have loss of balance once. very distracted with mobility efforts                             Cognition Arousal/Alertness:  (lethargic initially, awake/alert with stimulation ) Behavior During Therapy: Agitated Overall Cognitive Status: History of cognitive impairments - at baseline                                 General Comments: intermittently paranoid, patient is agitated and combative at times however is redirected with verbal cues from therapist and daughter at bedside. patient is able to follow single step commands some of the time with extra time  Exercises General Exercises - Lower Extremity Ankle Circles/Pumps: AAROM;Strengthening;Both;10 reps;Supine Heel Slides: AAROM;Strengthening;Both;10 reps;Supine Hip ABduction/ADduction: AAROM;Strengthening;Both;10 reps;Supine Other Exercises Other Exercises: verbal and tactile cues for technique and participation.     General Comments        Pertinent Vitals/Pain Pain Assessment: No/denies  pain    Home Living                      Prior Function            PT Goals (current goals can now be found in the care plan section) Acute Rehab PT Goals Patient Stated Goal: to return home  PT Goal Formulation: With family Potential to Achieve Goals: Fair Progress towards PT goals: Progressing toward goals    Frequency    Hancock 2X/week      PT Plan Current plan remains appropriate    Co-evaluation              AM-PAC PT "6 Clicks" Mobility   Outcome Measure  Help needed turning from your back to your side while in a flat bed without using bedrails?: A Little Help needed moving from lying on your back to sitting on the side of a flat bed without using bedrails?: A Little Help needed moving to and from a bed to a chair (including a wheelchair)?: A Little Help needed standing up from a chair using your arms (e.g., wheelchair or bedside chair)?: A Little Help needed to walk in hospital room?: A Lot Help needed climbing 3-5 steps with a railing? : A Lot 6 Click Score: 16    End of Session   Activity Tolerance: Treatment limited secondary to agitation Patient left: in bed;with call bell/phone within reach;with bed alarm set;with family/visitor present Nurse Communication: Mobility status;Precautions PT Visit Diagnosis: Other abnormalities of gait and mobility (R26.89);Muscle weakness (generalized) (M62.81);Difficulty in walking, not elsewhere classified (R26.2)     Time: 2706-2376 PT Time Calculation (Hancock) (ACUTE ONLY): 48 Hancock  Charges:  $Therapeutic Exercise: 8-22 mins $Therapeutic Activity: 23-37 mins                     Donna Bernard, PT, MPT    Amanda Hancock 12/23/2019, 1:18 PM

## 2019-12-24 DIAGNOSIS — I10 Essential (primary) hypertension: Secondary | ICD-10-CM | POA: Diagnosis not present

## 2019-12-24 DIAGNOSIS — G309 Alzheimer's disease, unspecified: Secondary | ICD-10-CM | POA: Diagnosis not present

## 2019-12-24 DIAGNOSIS — F0281 Dementia in other diseases classified elsewhere with behavioral disturbance: Secondary | ICD-10-CM | POA: Diagnosis not present

## 2019-12-24 DIAGNOSIS — Z7189 Other specified counseling: Secondary | ICD-10-CM

## 2019-12-24 DIAGNOSIS — F323 Major depressive disorder, single episode, severe with psychotic features: Secondary | ICD-10-CM | POA: Diagnosis not present

## 2019-12-24 MED ORDER — ONDANSETRON 4 MG PO TBDP: 4.0000 mg | ORAL_TABLET | Freq: Four times a day (QID) | ORAL | Status: DC | PRN

## 2019-12-24 MED ORDER — POLYVINYL ALCOHOL 1.4 % OP SOLN
1.0000 [drp] | Freq: Four times a day (QID) | OPHTHALMIC | Status: DC | PRN
  Filled 2019-12-24: qty 15

## 2019-12-24 MED ORDER — ACETAMINOPHEN 650 MG RE SUPP: 650.0000 mg | Freq: Four times a day (QID) | RECTAL | Status: DC | PRN

## 2019-12-24 MED ORDER — CHLORPROMAZINE HCL 25 MG/ML IJ SOLN
25.0000 mg | Freq: Three times a day (TID) | INTRAMUSCULAR | Status: DC | PRN
  Filled 2019-12-24 (×2): qty 1

## 2019-12-24 MED ORDER — GLYCOPYRROLATE 1 MG PO TABS
1.0000 mg | ORAL_TABLET | ORAL | Status: DC | PRN
  Filled 2019-12-24: qty 1

## 2019-12-24 MED ORDER — OLANZAPINE 5 MG PO TBDP
5.0000 mg | ORAL_TABLET | Freq: Two times a day (BID) | ORAL | Status: DC
  Administered 2019-12-24 – 2019-12-28 (×8): 5 mg via ORAL
  Filled 2019-12-24 (×9): qty 1

## 2019-12-24 MED ORDER — ACETAMINOPHEN 325 MG PO TABS: 650.0000 mg | ORAL_TABLET | Freq: Four times a day (QID) | ORAL | Status: DC | PRN

## 2019-12-24 MED ORDER — MORPHINE SULFATE (PF) 2 MG/ML IV SOLN
2.0000 mg | INTRAVENOUS | Status: DC | PRN
  Administered 2019-12-27: 2 mg via INTRAVENOUS
  Filled 2019-12-24: qty 1

## 2019-12-24 MED ORDER — SODIUM CHLORIDE 0.9% FLUSH
3.0000 mL | INTRAVENOUS | Status: DC | PRN
  Administered 2019-12-28: 3 mL via INTRAVENOUS

## 2019-12-24 MED ORDER — HALOPERIDOL LACTATE 2 MG/ML PO CONC
1.0000 mg | ORAL | Status: DC | PRN
  Filled 2019-12-24: qty 0.5

## 2019-12-24 MED ORDER — HALOPERIDOL LACTATE 5 MG/ML IJ SOLN
0.5000 mg | INTRAMUSCULAR | Status: DC | PRN
  Administered 2019-12-28: 0.5 mg via INTRAVENOUS
  Filled 2019-12-24: qty 1

## 2019-12-24 MED ORDER — GLYCOPYRROLATE 0.2 MG/ML IJ SOLN
0.2000 mg | INTRAMUSCULAR | Status: DC | PRN
  Filled 2019-12-24: qty 1

## 2019-12-24 MED ORDER — BIOTENE DRY MOUTH MT LIQD: 15.0000 mL | OROMUCOSAL | Status: DC | PRN

## 2019-12-24 MED ORDER — ONDANSETRON HCL 4 MG/2ML IJ SOLN: 4.0000 mg | Freq: Four times a day (QID) | INTRAMUSCULAR | Status: DC | PRN

## 2019-12-24 MED ORDER — SODIUM CHLORIDE 0.9% FLUSH
3.0000 mL | Freq: Two times a day (BID) | INTRAVENOUS | Status: DC
  Administered 2019-12-25 – 2019-12-28 (×6): 3 mL via INTRAVENOUS

## 2019-12-24 NOTE — Consult Note (Addendum)
Consultation Note Date: 12/24/2019   Patient Name: Amanda Hancock  DOB: [birth year]  MRN: [id number]  Age / Sex: 74 y.o., female  PCP: Pleas Koch, NP Referring Physician: Ezekiel Slocumb, DO  Reason for Consultation: Establishing goals of care  HPI/Patient Profile:  Fatmata BRANDEE MARKIN is a 74 y.o. female with medical history significant of dementia, HTN, nephrolithiasis presented to [facility] due to increased confusion from baseline and recurrent hematuria. She has known non-obstructing nephrolithiasis and prior episodes of hematuria due to cystitis.   Clinical Assessment and Goals of Care: Patient is resting in bed at this time.  She is unable to answer any of my questions including her name, if she is married, or how many children she has if any.  Her sister is at bedside.  She cannot tell me her sister's name without her sister's prompting.  Patient has on one mitten that is opposite of her IV site.  She is calm at this time but has to be frequently reminded not to try to remove the mitten or pull out her IV.  Her sister states that she lives at home with her husband.  She states the patient has 2 children.  She called the patient's daughter, who had Ms. Noa's husband with her at the time.  At baseline she is able to walk with assistance if needed, she is able to feed herself, she wears depends for accidents but is continent usually of bowel and bladder.  She does sometimes have to be reminded to pull her underwear up after using the bathroom.  She is conversive though sometimes forgetful.  We discussed her diagnoses, prognosis, GOC, EOL wishes disposition and options.  A detailed discussion was had today regarding advanced directives.  Concepts specific to code status, artifical feeding and hydration, IV antibiotics and rehospitalization were discussed.  The difference between an aggressive medical  intervention path and a comfort care path was discussed.  Values and goals of care important to patient and family were attempted to be elicited.  Discussed limitations of medical interventions to prolong quality of life in some situations and discussed the concept of human mortality.  Discussed her very poor oral intake.  They voiced concern that she is agitated and aggressive trying to hit family and staff, and spits her pills back out as well as food.  They state that they are concerned with her quality of life, and want her to be comfortable for what time she has left on earth.   Spouse and other family would like to no longer focus on life prolonging care, and transition focus to comfort care.  Spouse and family would not want chest compressions shocks or breathing tube in cardiopulmonary arrest.  They would never want her put on a ventilator.  They do not want a feeding tube, and understands she would not tolerate one at this time.   I completed a MOST form today through [employer] with husband and daughter, and the signed original was placed in the chart. A photocopy  was placed in the chart to be scanned into EMR. The patient outlined their wishes for the following treatment decisions:  Cardiopulmonary Resuscitation: Do Not Attempt Resuscitation (DNR/No CPR)  Medical Interventions: Comfort Measures: Keep clean, warm, and dry. Use medication by any route, positioning, wound care, and other measures to relieve pain and suffering. Use oxygen, suction and manual treatment of airway obstruction as needed for comfort. Do not transfer to the hospital unless comfort needs cannot be met in current location.  Antibiotics: No antibiotics (use other measures to relieve symptoms)  IV Fluids: No IV fluids (provide other measures to ensure comfort)  Feeding Tube: No feeding tube      SUMMARY OF RECOMMENDATIONS   Recommend comfort focused care.  Recommend hospice facility placement given patient's extremely poor  p.o. intake and her symptom management needs. Medication management discussed with Dr. Hilma Favors.   Prognosis:  < 2 weeks        Primary Diagnoses: Present on Admission: . HTN (hypertension) . 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 Mixed Alzheimer's and vascular dementia ([place]) . Gross hematuria . Hydronephrosis of right kidney . UTI (urinary tract infection) . Dementia with behavioral disturbance ([place]) . Hemorrhagic cystitis   I have reviewed the medical record, interviewed the patient and family, and examined the patient. The following aspects are pertinent.  Past Medical History:  Diagnosis Date  . Abnormal Pap smear    ASCUS  . Anxiety   . Chronic kidney disease    KIDNEYSTONES  . Depression   . Elevated blood pressure (not hypertension)   . Hypertension   . Vaginal Pap smear with ASC-US and positive HPV 12/20/2010  . Vaginal Pap smear, abnormal    Social History   Socioeconomic History  . Marital status: Married    Spouse name: Not on file  . Number of children: Not on file  . Years of education: Not on file  . Highest education level: Not on file  Occupational History  . Not on file  Tobacco Use  . Smoking status: Never Smoker  . Smokeless tobacco: Never Used  Substance and Sexual Activity  . Alcohol use: No  . Drug use: No  . Sexual activity: Not Currently  Other Topics Concern  . Not on file  Social History Narrative   Married.   2 children, 3 grandchildren, 1 grandchild.   Does not have a living will.   Would desire CPR and life support if not futile.   Social Determinants of Health   Financial Resource Strain:   . Difficulty of Paying Living Expenses: Not on file  Food Insecurity:   . Worried About Charity fundraiser in the Last Year: Not on file  . Ran Out of Food in the Last Year: Not on file  Transportation Needs:   . Lack of Transportation (Medical): Not on file  . Lack of Transportation (Non-Medical): Not on file  Physical  Activity:   . Days of Exercise per Week: Not on file  . Minutes of Exercise per Session: Not on file  Stress:   . Feeling of Stress : Not on file  Social Connections:   . Frequency of Communication with Friends and Family: Not on file  . Frequency of Social Gatherings with Friends and Family: Not on file  . Attends Religious Services: Not on file  . Active Member of Clubs or Organizations: Not on file  . Attends Archivist Meetings: Not on file  . Marital Status: Not on  file   Family History  Problem Relation Age of Onset  . Parkinsonism Mother   . Dementia Mother   . Heart attack Paternal Grandfather   . Diabetes Maternal Grandmother   . Heart attack Father   . Dementia Cousin   . Breast cancer Maternal Aunt   . Breast cancer Paternal Aunt    Scheduled Meds: . acidophilus  2 capsule Oral TID  . amLODipine  10 mg Oral QHS  . ARIPiprazole  1 mg Oral Daily  . aspirin EC  81 mg Oral Daily  . docusate sodium  100 mg Oral Daily  . donepezil  5 mg Oral QHS  . enoxaparin (LOVENOX) injection  40 mg Subcutaneous Q24H  . feeding supplement (ENSURE ENLIVE)  237 mL Oral TID BM  . influenza vaccine adjuvanted  0.5 mL Intramuscular Tomorrow-1000  . losartan  100 mg Oral Daily  . melatonin  2.5 mg Oral QHS  . phenazopyridine  100 mg Oral TID WC  . polyethylene glycol  17 g Oral Daily  . QUEtiapine  50 mg Oral QHS  . senna  1 tablet Oral BID  . sertraline  100 mg Oral Daily  . vitamin B-12  2,000 mcg Oral Daily   Continuous Infusions: PRN Meds:.acetaminophen **OR** acetaminophen, haloperidol, hydrALAZINE, hydrALAZINE, ondansetron (ZOFRAN) IV Medications Prior to Admission:  Prior to Admission medications   Medication Sig Start Date End Date Taking? Authorizing Provider  amLODipine (NORVASC) 10 MG tablet Take 1 tablet by mouth once daily for blood pressure Patient taking differently: Take 10 mg by mouth at bedtime.  09/23/19  Yes Pleas Koch, NP  aspirin 81 MG tablet  Take 81 mg by mouth daily.   Yes [provider]  Cyanocobalamin (B-12 PO) Take 2,000 mcg by mouth daily.   Yes [provider]  donepezil (ARICEPT) 5 MG tablet Take 5 mg by mouth at bedtime.   Yes [provider]  Ibuprofen-diphenhydrAMINE HCl 200-25 MG CAPS Take 1 tablet by mouth at bedtime.   Yes [provider]  losartan (COZAAR) 50 MG tablet Take 1 tablet by mouth once daily for blood pressure Patient taking differently: Take 50 mg by mouth daily.  09/23/19  Yes Pleas Koch, NP  melatonin 3 MG TABS tablet Take 3 mg by mouth at bedtime.   Yes [provider]  Multiple Vitamins-Minerals (MULTIVITAMIN ADULT PO) Take 1 tablet by mouth daily.    Yes [provider]  QUEtiapine (SEROQUEL) 50 MG tablet Take 50 mg by mouth at bedtime.    Yes [provider]  sertraline (ZOLOFT) 100 MG tablet TAKE 1 TABLET BY MOUTH ONCE DAILY Patient taking differently: Take 100 mg by mouth daily.  06/25/16  Yes Lucille Passy, MD   Allergies  Allergen Reactions  . Codeine Nausea And Vomiting and Other (See Comments)    headache  . Ace Inhibitors Cough  . Hctz [Hydrochlorothiazide]     Rash    Review of Systems  Unable to perform ROS   Physical Exam Pulmonary:     Effort: Pulmonary effort is normal.  Neurological:     Mental Status: She is alert.     Vital Signs: BP (!) 146/62 (BP Location: Right Arm)   Pulse 60   Temp (!) 97.3 F (36.3 C) (Oral)   Resp 16   Ht 5' (1.524 m)   Wt 81 kg   SpO2 97%   BMI 34.88 kg/m  Pain Scale: 0-10   Pain Score:  0-No pain   SpO2: SpO2: 97 % O2 Device:SpO2: 97 % O2 Flow Rate: .   IO: Intake/output summary:   Intake/Output Summary (Last 24 hours) at 12/24/2019 1625 Last data filed at 12/24/2019 1500 Gross per 24 hour  Intake 240 ml  Output 300 ml  Net -60 ml    LBM: Last BM Date: 12/22/19 Baseline Weight: Weight: 81 kg Most recent weight: Weight: 81 kg     Palliative  Assessment/Data:     Time In: 330 Time Out: 4:40 Time Total: 70 min Greater than 50%  of this time was spent counseling and coordinating care related to the above assessment and plan.  Signed by: Asencion Gowda, NP   Please contact Palliative Medicine Team phone at [phone] for questions and concerns.  For individual provider: See Shea Evans

## 2019-12-24 NOTE — Care Management Important Message (Signed)
Important Message  Patient Details  Name: Amanda Hancock MRN: [id number] Date of Birth: 11-Oct-1945   Medicare Important Message Given:  Yes     [name] 12/24/2019, 11:37 AM

## 2019-12-24 NOTE — Progress Notes (Signed)
Multiple attempts have been made throughout the shift to obtain vital signs of which patient refused and becomes combative.  Dr. Denton Lank notified.

## 2019-12-24 NOTE — Progress Notes (Signed)
PROGRESS NOTE    TATIONNA Amanda Hancock   GUR:[id number]  DOB: 15-Jun-1945  PCP: Doreene Nest, NP    DOA: 12/18/2019 LOS: 5   Brief Narrative   Summary from Dr. Marylu Hancock 10/5: "Amanda Hancock is a 74 y.o. female with medical history significant of dementia, HTN, nephrolithiasis presented to [facility] due to increased confusion from baseline and recurrent hematuria. She has known non-obstructing nephrolithiasis and prior episodes of hematuria due to cystitis.    10/3-daughter at bedside complaining patient has decreased p.o. intake and still mental status is not at baseline still confused."     Assessment & Plan   Principal Problem:   Dementia with behavioral disturbance (HCC) Active Problems:   HTN (hypertension)   Major depression with psychotic features (HCC)   Mixed Alzheimer's and vascular dementia (HCC)   Gross hematuria   Nephrolithiasis   Hydronephrosis of right kidney   UTI (urinary tract infection)   Hemorrhagic cystitis   Altered mental status -present at admission - superimposed on baseline dementia.  Due to infection most likely, hospital delirium contributing now that infection has been treated.  Patient refusing to take medications, frequently agitated and combative with family and staff.   Continue supportive care, reorientation, encourage p.o. intake --Delirium precautions:     -Lights and TV off, minimize interruptions at night    -Blinds open and lights on during day    -Glasses/hearing aid with patient    -Frequent reorientation    -PT/OT when able    -Avoid sedation medications/Beers list medications --other mgmt as below for dementia  Moderate severity right-sided hydronephrosis/Bilateral nonobstructing renal stones- Urology consulted and reported that the stone is too large to pass and may be getting larger over time, did not require any urgent intervention.  May need ureteroscopy versus shockwave lithotripsy but family will consider.  Plan is to see  patient as outpatient to plan therapy.  Patient will need to follow-up as outpatient with urology on discharge.   Urology signed off on 10/3.  UTI/hemorrhagic cystitis- no evidence of sepsis, no leukocystitis. Urine cultures grew pan-sensitive E coli.   --Completed 5 days course of Rocephin --d/c IV fluids - monitor renal function and electrolytes, concern for inadequate PO intake with her agitation  Dementia (mixed Alzheimer's, Vascular, ?Lewy Body) - with behavioral disturbances (agitation, combativeness).   --Continue home Aricept, Seroquel, Zoloft.   --Followed by palliative at home. --Psychiatry consulted for medication recommendations --Added Abilify 1mg  qAM --IV Haldol PRN --Avoid benzos - worsens agitation  Hypertension - poorly controlled on home amlodipine and losartan.  Increased losartan to 100 mg daily. PO hydralazine PRN.  Generalized weakness - PT and OT recommending SNF 3-n-1.   Obesity: Body mass index is 34.88 kg/m.  Complicates overall care and prognosis.  DVT prophylaxis: enoxaparin (LOVENOX) injection 40 mg Start: 12/18/19 1845 SCDs Start: 12/18/19 1831   Diet:  Diet Orders (From admission, onward)    Start     Ordered   12/19/19 1730  DIET SOFT Room service appropriate? Yes; Fluid consistency: Thin  Diet effective now       Question Answer Comment  Room service appropriate? Yes   Fluid consistency: Thin      12/19/19 1729            Code Status: Full Code    Subjective 12/24/19    Patient seen with daughter at bedside this AM.  Daughter attempting to get patient to take meds in applesauce, but pt adamantly refusing to open  mouth and tries to spit everything out, swings fists (in mittens) periodically.  Daughter very frustrated and upset with patient being so uncooperative.  Patient denies pain or other complaints, just wants to be left alone.   Disposition Plan & Communication   Status is: Inpatient  Remains inpatient appropriate because:  ongoing behavioral disturbances make SNF discharge at this time unrealistic.  Titrating medications for control of behavior, but may need to consider geri-psych placement if not improving.   Dispo: The patient is from: Home              Anticipated d/c is to: SNF              Anticipated d/c date is: 1-2 days              Patient currently is not medically stable to d/c.     Family Communication: daughter at bedside on rounds    Consults, Procedures, Significant Events   Consultants:   Urology  Palliative Care  Psychiatry    Antimicrobials:  Anti-infectives (From admission, onward)   Start     Dose/Rate Route Frequency Ordered Stop   12/19/19 1400  cefTRIAXone (ROCEPHIN) 1 g in sodium chloride 0.9 % 100 mL IVPB        1 g 200 mL/hr over 30 Minutes Intravenous Every 24 hours 12/19/19 1111 12/22/19 1537   12/19/19 0000  cefTRIAXone (ROCEPHIN) 1 g in sodium chloride 0.9 % 100 mL IVPB  Status:  Discontinued        1 g 200 mL/hr over 30 Minutes Intravenous Every 24 hours 12/18/19 1833 12/18/19 1834   12/18/19 2200  cefdinir (OMNICEF) capsule 300 mg  Status:  Discontinued        300 mg Oral Every 12 hours 12/18/19 1838 12/18/19 1900   12/18/19 2000  cefdinir (OMNICEF) capsule 300 mg  Status:  Discontinued        300 mg Oral Every 12 hours 12/18/19 1900 12/19/19 1111   12/18/19 1400  cefTRIAXone (ROCEPHIN) 1 g in sodium chloride 0.9 % 100 mL IVPB        1 g 200 mL/hr over 30 Minutes Intravenous  Once 12/18/19 1359 12/18/19 1726         Objective   Vitals:   12/23/19 1147 12/23/19 1745 12/23/19 2217 12/24/19 0646  BP: (!) 146/61 (!) 160/90 (!) 164/51 (!) 146/62  Pulse: 66 67 64 60  Resp: Temp: (!) 97.5 F (36.4 C)  97.9 F (36.6 C) (!) 97.3 F (36.3 C)  TempSrc: Oral  Oral Oral  SpO2: 98% 98% 97%   Weight:      Height:        Intake/Output Summary (Last 24 hours) at 12/24/2019 1229 Last data filed at 12/24/2019 0900 Gross per 24 hour  Intake 240 ml    Output --  Net 240 ml   Filed Weights   12/18/19 1229  Weight: 81 kg    Physical Exam:  General exam: awake, alert, no acute distress Respiratory system: CTAB, normal respiratory effort. Cardiovascular system: normal S1/S2, RRR, no pedal edema.   Gastrointestinal system: soft, NT, ND Central nervous system: not oriented, no gross focal neurologic deficits, normal speech Psychiatry: irritable mood, congruent affect, abnormal judgement and insight   Labs   Data Reviewed: I have personally reviewed following labs and imaging studies  CBC: Recent Labs  Lab 12/18/19 1241 12/19/19 0351 12/23/19 0523  WBC 8.6 8.9 4.9  NEUTROABS 7.2  --   --  HGB 10.9* 9.7* 10.7*  HCT 32.4* 29.0* 31.1*  MCV 84.2 85.5 83.4  PLT 178 157 165   Basic Metabolic Panel: Recent Labs  Lab 12/18/19 1241 12/19/19 0351 12/23/19 0523  NA 139 141 143  K 3.4* 3.8 3.8  CL 105 109 108  CO2 25 26 28   GLUCOSE 116* 112* 99  BUN 18 17 9   CREATININE 1.02* 0.95 0.68  CALCIUM 10.4* 9.5 10.0   GFR: Estimated Creatinine Clearance: 58.1 mL/min (by C-G formula based on SCr of 0.68 mg/dL). Liver Function Tests: Recent Labs  Lab 12/18/19 1241  AST 14*  ALT 15  ALKPHOS 96  BILITOT 0.9  PROT 6.3*  ALBUMIN 3.2*   No results for input(s): LIPASE, AMYLASE in the last 168 hours. No results for input(s): AMMONIA in the last 168 hours. Coagulation Profile: No results for input(s): INR, PROTIME in the last 168 hours. Cardiac Enzymes: No results for input(s): CKTOTAL, CKMB, CKMBINDEX, TROPONINI in the last 168 hours. BNP (last 3 results) No results for input(s): PROBNP in the last 8760 hours. HbA1C: No results for input(s): HGBA1C in the last 72 hours. CBG: No results for input(s): GLUCAP in the last 168 hours. Lipid Profile: No results for input(s): CHOL, HDL, LDLCALC, TRIG, CHOLHDL, LDLDIRECT in the last 72 hours. Thyroid Function Tests: No results for input(s): TSH, T4TOTAL, FREET4, T3FREE,  THYROIDAB in the last 72 hours. Anemia Panel: No results for input(s): VITAMINB12, FOLATE, FERRITIN, TIBC, IRON, RETICCTPCT in the last 72 hours. Sepsis Labs: Recent Labs  Lab 12/18/19 1252  LATICACIDVEN 1.3    Recent Results (from the past 240 hour(s))  Blood culture (routine x 2)     Status: None   Collection Time: 12/18/19 12:52 PM   Specimen: BLOOD  Result Value Ref Range Status   Specimen Description BLOOD LAC  Final   Special Requests   Final    BOTTLES DRAWN AEROBIC AND ANAEROBIC Blood Culture adequate volume   Culture   Final    NO GROWTH 5 DAYS Performed at [facility], [address] [place], [place] [address]    Report Status 12/23/2019 FINAL  Final  Respiratory Panel by RT PCR (Flu A&B, Covid) - Urine, Clean Catch     Status: None   Collection Time: 12/18/19 12:52 PM   Specimen: Urine, Clean Catch  Result Value Ref Range Status   SARS Coronavirus 2 by RT PCR NEGATIVE NEGATIVE Final    Comment: (NOTE) SARS-CoV-2 target nucleic acids are NOT DETECTED.  The SARS-CoV-2 RNA is generally detectable in upper respiratoy specimens during the acute phase of infection. The lowest concentration of SARS-CoV-2 viral copies this assay can detect is 131 copies/mL. A negative result does not preclude SARS-Cov-2 infection and should not be used as the sole basis for treatment or other patient management decisions. A negative result may occur with  improper specimen collection/handling, submission of specimen other than nasopharyngeal swab, presence of viral mutation(s) within the areas targeted by this assay, and inadequate number of viral copies (<131 copies/mL). A negative result must be combined with clinical observations, patient history, and epidemiological information. The expected result is Negative.  Fact Sheet for Patients:  https://www.moore.com/https://www.fda.gov/media/142436/download  Fact Sheet for Healthcare Providers:  https://www.young.biz/https://www.fda.gov/media/142435/download  This  test is no t yet approved or cleared by the [place] FDA and  has been authorized for detection and/or diagnosis of SARS-CoV-2 by FDA under an Emergency Use Authorization (EUA). This EUA will remain  in effect (meaning this test can  be used) for the duration of the COVID-19 declaration under Section 564(b)(1) of the Act, 21 U.S.C. section 360bbb-3(b)(1), unless the authorization is terminated or revoked sooner.     Influenza A by PCR NEGATIVE NEGATIVE Final   Influenza B by PCR NEGATIVE NEGATIVE Final    Comment: (NOTE) The Xpert Xpress SARS-CoV-2/FLU/RSV assay is intended as an aid in  the diagnosis of influenza from Nasopharyngeal swab specimens and  should not be used as a sole basis for treatment. Nasal washings and  aspirates are unacceptable for Xpert Xpress SARS-CoV-2/FLU/RSV  testing.  Fact Sheet for Patients: https://www.moore.com/  Fact Sheet for Healthcare Providers: https://www.young.biz/  This test is not yet approved or cleared by the [place] FDA and  has been authorized for detection and/or diagnosis of SARS-CoV-2 by  FDA under an Emergency Use Authorization (EUA). This EUA will remain  in effect (meaning this test can be used) for the duration of the  Covid-19 declaration under Section 564(b)(1) of the Act, 21  U.S.C. section 360bbb-3(b)(1), unless the authorization is  terminated or revoked. Performed at [facility], [address] [place], [place] [address]   Urine culture     Status: Abnormal   Collection Time: 12/18/19 12:52 PM   Specimen: Urine, Random  Result Value Ref Range Status   Specimen Description   Final    URINE, RANDOM Performed at [facility], [address] [place], [place] [address]    Special Requests   Final    NONE Performed at [facility], [address] [place], [place] [address]    Culture >=100,000 COLONIES/mL ESCHERICHIA COLI (A)  Final    Report Status 12/21/2019 FINAL  Final   Organism ID, Bacteria ESCHERICHIA COLI (A)  Final      Susceptibility   Escherichia coli - MIC*    AMPICILLIN <=2 SENSITIVE Sensitive     CEFAZOLIN <=4 SENSITIVE Sensitive     CEFTRIAXONE <=0.25 SENSITIVE Sensitive     CIPROFLOXACIN <=0.25 SENSITIVE Sensitive     GENTAMICIN <=1 SENSITIVE Sensitive     IMIPENEM 0.5 SENSITIVE Sensitive     NITROFURANTOIN <=16 SENSITIVE Sensitive     TRIMETH/SULFA <=20 SENSITIVE Sensitive     AMPICILLIN/SULBACTAM <=2 SENSITIVE Sensitive     PIP/TAZO <=4 SENSITIVE Sensitive     * >=100,000 COLONIES/mL ESCHERICHIA COLI  Blood culture (routine x 2)     Status: None   Collection Time: 12/18/19 12:53 PM   Specimen: BLOOD  Result Value Ref Range Status   Specimen Description BLOOD RIGHT HAND  Final   Special Requests   Final    BOTTLES DRAWN AEROBIC AND ANAEROBIC Blood Culture adequate volume   Culture   Final    NO GROWTH 5 DAYS Performed at [facility], [address], [place], [place] [address]    Report Status 12/23/2019 FINAL  Final      Imaging Studies   No results found.   Medications   Scheduled Meds: . acidophilus  2 capsule Oral TID  . amLODipine  10 mg Oral QHS  . ARIPiprazole  1 mg Oral Daily  . aspirin EC  81 mg Oral Daily  . docusate sodium  100 mg Oral Daily  . donepezil  5 mg Oral QHS  . enoxaparin (LOVENOX) injection  40 mg Subcutaneous Q24H  . feeding supplement (ENSURE ENLIVE)  237 mL Oral TID BM  . influenza vaccine adjuvanted  0.5 mL Intramuscular Tomorrow-1000  . losartan  100 mg Oral  Daily  . melatonin  2.5 mg Oral QHS  . phenazopyridine  100 mg Oral TID WC  . polyethylene glycol  17 g Oral Daily  . QUEtiapine  50 mg Oral QHS  . senna  1 tablet Oral BID  . sertraline  100 mg Oral Daily  . vitamin B-12  2,000 mcg Oral Daily   Continuous Infusions:     LOS: 5 days    Time spent: 30 minutes with >50% spent in coordination of care and direct patient  contact.    Pennie Banter, DO [facility]  12/24/2019, 12:29 PM    If 7PM-7AM, please contact night-coverage. How to contact the [facility] Attending or Consulting provider 7A - 7P or covering provider during after hours 7P -7A, for this patient?    1. Check the care team in [facility] and look for a) attending/consulting [facility] provider listed and b) the [facility] team listed 2. Log into [email or web address] and use [facility] universal password to access. If you do not have the password, please contact the hospital operator. 3. Locate the [facility] provider you are looking for under [facility] and page to a number that you can be directly reached. 4. If you still have difficulty reaching the provider, please page the [facility] (Director on Call) for the Hospitalists listed on amion for assistance.

## 2019-12-24 NOTE — Plan of Care (Signed)
Continuing with plan of care. 

## 2019-12-25 DIAGNOSIS — G309 Alzheimer's disease, unspecified: Secondary | ICD-10-CM | POA: Diagnosis not present

## 2019-12-25 DIAGNOSIS — Z515 Encounter for palliative care: Secondary | ICD-10-CM

## 2019-12-25 DIAGNOSIS — F0281 Dementia in other diseases classified elsewhere with behavioral disturbance: Secondary | ICD-10-CM | POA: Diagnosis not present

## 2019-12-25 MED ORDER — OLANZAPINE 10 MG IM SOLR
5.0000 mg | Freq: Two times a day (BID) | INTRAMUSCULAR | Status: DC
  Filled 2019-12-25 (×3): qty 10

## 2019-12-25 NOTE — Progress Notes (Signed)
PROGRESS NOTE    Amanda Hancock   FSF:[id number]  DOB: 1945/05/27  PCP: Pleas Koch, NP    DOA: 12/18/2019 LOS: 6   Brief Narrative   Summary from Dr. Kurtis Bushman 10/5: "Amanda Hancock is a 74 y.o. female with medical history significant of dementia, HTN, nephrolithiasis presented to [facility] due to increased confusion from baseline and recurrent hematuria. She has known non-obstructing nephrolithiasis and prior episodes of hematuria due to cystitis."    10/3 to 10/8 - patient has had ongoing agitation, combativeness, minimal oral intake, refusing medications.  Family met with palliative care and have transitioned to comfort-focused care.  Awaiting hospice placement.      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  Awaiting hospice placement.  Medications for comfort per orders.   No further labs or vitals per family request as this provokes her agitation.     Altered mental status -present at admission - superimposed on baseline dementia.  Due to infection most likely, hospital delirium contributing now that infection has been treated.  Patient refusing to take medications, frequently agitated and combative with family and staff.   Continue supportive care, reorientation, encourage p.o. intake --Delirium precautions:     -Lights and TV off, minimize interruptions at night    -Blinds open and lights on during day    -Glasses/hearing aid with patient    -Frequent reorientation    -PT/OT when able    -Avoid sedation medications/[place] list medications --other mgmt as below for  dementia  Moderate severity right-sided hydronephrosis/Bilateral nonobstructing renal stones- Urology consulted and reported that the stone is too large to pass and may be getting larger over time, did not require any urgent intervention.  May need ureteroscopy versus shockwave lithotripsy but family will consider.  Plan is to see patient as outpatient to plan therapy.  Patient will need to follow-up as outpatient with urology on discharge.   Urology signed off on 10/3.  UTI/hemorrhagic cystitis- no evidence of sepsis, no leukocystitis. Urine cultures grew pan-sensitive E coli.   --Completed 5 days course of Rocephin --d/c IV fluids - monitor renal function and electrolytes, concern for inadequate PO intake with her agitation  Dementia (mixed Alzheimer's, Vascular, ?Lewy Body) - with behavioral disturbances (agitation, combativeness).   --Continue home Aricept, Seroquel at bedtime, Zoloft.   --Followed by palliative at home. --Psychiatry consulted for medication recommendations --Added Abilify 3m qAM --SL or IV Haldol PRN --IM Thorazine TID PRN psychosis --Zyprexa ODT or IM BID --Avoid benzos - worsens agitation  Hypertension - poorly controlled on home amlodipine and losartan.  Increased losartan to 100 mg daily. PO hydralazine PRN.  Generalized weakness - PT and OT recommending SNF 3-n-1.   Obesity: Body mass index is 34.88 kg/m.  Complicates overall care and prognosis.  DVT prophylaxis:    Diet:  Diet Orders (From admission, onward)    Start     Ordered   12/25/19 1237  Diet regular Room service appropriate? Yes; Fluid consistency: Thin  Diet effective now       Question Answer Comment  Room service appropriate? Yes  Fluid consistency: Thin      12/25/19 1237            Code Status: DNR    Subjective 12/25/19    Patient seen with daughter and husband at bedside this AM.  It appears current medication regimen has helped.  Still trouble giving her medications  by mouth per nursing.  Patient denies any pain, discomfort or other complaints.    Disposition Plan & Communication   Status is: Inpatient  Remains inpatient appropriate because: Awaiting hospice placement    Dispo: The patient is from: Home              Anticipated d/c is to: Hospice              Anticipated d/c date is: 1-2 days              Patient currently IS medically stable for discharge.     Family Communication: daughter and husband at bedside on rounds    Consults, Procedures, Significant Events   Consultants:   Urology  Palliative Care  Psychiatry    Antimicrobials:  Anti-infectives (From admission, onward)   Start     Dose/Rate Route Frequency Ordered Stop   12/19/19 1400  cefTRIAXone (ROCEPHIN) 1 g in sodium chloride 0.9 % 100 mL IVPB        1 g 200 mL/hr over 30 Minutes Intravenous Every 24 hours 12/19/19 1111 12/22/19 1537   12/19/19 0000  cefTRIAXone (ROCEPHIN) 1 g in sodium chloride 0.9 % 100 mL IVPB  Status:  Discontinued        1 g 200 mL/hr over 30 Minutes Intravenous Every 24 hours 12/18/19 1833 12/18/19 1834   12/18/19 2200  cefdinir (OMNICEF) capsule 300 mg  Status:  Discontinued        300 mg Oral Every 12 hours 12/18/19 1838 12/18/19 1900   12/18/19 2000  cefdinir (OMNICEF) capsule 300 mg  Status:  Discontinued        300 mg Oral Every 12 hours 12/18/19 1900 12/19/19 1111   12/18/19 1400  cefTRIAXone (ROCEPHIN) 1 g in sodium chloride 0.9 % 100 mL IVPB        1 g 200 mL/hr over 30 Minutes Intravenous  Once 12/18/19 1359 12/18/19 1726         Objective   Vitals:   12/23/19 2217 12/24/19 0646 12/24/19 0800 12/25/19 0746  BP:  (!) 146/62    Pulse:  60    Resp:   16 20  Temp:  (!) 97.3 F (36.3 C)    TempSrc: Oral Oral    SpO2: 97%     Weight:      Height:        Intake/Output Summary (Last 24 hours) at 12/25/2019 1410 Last data filed at 12/25/2019 0730 Gross per 24 hour  Intake 60 ml  Output 300 ml  Net -240 ml   Filed  Weights   12/18/19 1229  Weight: 81 kg    Physical Exam:  General exam: awake, alert, no acute distress, calm demeanor Respiratory system: CTAB, normal respiratory effort. Cardiovascular system: normal S1/S2, RRR, no pedal edema.   Gastrointestinal system: soft, NT, ND Central nervous system: not oriented, no gross focal neurologic deficits, normal speech    Labs   Data Reviewed: I have personally reviewed following labs and imaging studies  CBC: Recent Labs  Lab 12/19/19 0351 12/23/19 0523  WBC 8.9 4.9  HGB 9.7* 10.7*  HCT 29.0* 31.1*  MCV  85.5 83.4  PLT 157 379   Basic Metabolic Panel: Recent Labs  Lab 12/19/19 0351 12/23/19 0523  NA 141 143  K 3.8 3.8  CL 109 108  CO2 26 28  GLUCOSE 112* 99  BUN 17 9  CREATININE 0.95 0.68  CALCIUM 9.5 10.0   GFR: Estimated Creatinine Clearance: 58.1 mL/min (by C-G formula based on SCr of 0.68 mg/dL). Liver Function Tests: No results for input(s): AST, ALT, ALKPHOS, BILITOT, PROT, ALBUMIN in the last 168 hours. No results for input(s): LIPASE, AMYLASE in the last 168 hours. No results for input(s): AMMONIA in the last 168 hours. Coagulation Profile: No results for input(s): INR, PROTIME in the last 168 hours. Cardiac Enzymes: No results for input(s): CKTOTAL, CKMB, CKMBINDEX, TROPONINI in the last 168 hours. BNP (last 3 results) No results for input(s): PROBNP in the last 8760 hours. HbA1C: No results for input(s): HGBA1C in the last 72 hours. CBG: No results for input(s): GLUCAP in the last 168 hours. Lipid Profile: No results for input(s): CHOL, HDL, LDLCALC, TRIG, CHOLHDL, LDLDIRECT in the last 72 hours. Thyroid Function Tests: No results for input(s): TSH, T4TOTAL, FREET4, T3FREE, THYROIDAB in the last 72 hours. Anemia Panel: No results for input(s): VITAMINB12, FOLATE, FERRITIN, TIBC, IRON, RETICCTPCT in the last 72 hours. Sepsis Labs: No results for input(s): PROCALCITON, LATICACIDVEN in the last 168  hours.  Recent Results (from the past 240 hour(s))  Blood culture (routine x 2)     Status: None   Collection Time: 12/18/19 12:52 PM   Specimen: BLOOD  Result Value Ref Range Status   Specimen Description BLOOD LAC  Final   Special Requests   Final    BOTTLES DRAWN AEROBIC AND ANAEROBIC Blood Culture adequate volume   Culture   Final    NO GROWTH 5 DAYS Performed at [facility], [address] [place], [place] [address]    Report Status 12/23/2019 FINAL  Final  Respiratory Panel by RT PCR (Flu A&B, Covid) - Urine, Clean Catch     Status: None   Collection Time: 12/18/19 12:52 PM   Specimen: Urine, Clean Catch  Result Value Ref Range Status   SARS Coronavirus 2 by RT PCR NEGATIVE NEGATIVE Final    Comment: (NOTE) SARS-CoV-2 target nucleic acids are NOT DETECTED.  The SARS-CoV-2 RNA is generally detectable in upper respiratoy specimens during the acute phase of infection. The lowest concentration of SARS-CoV-2 viral copies this assay can detect is 131 copies/mL. A negative result does not preclude SARS-Cov-2 infection and should not be used as the sole basis for treatment or other patient management decisions. A negative result may occur with  improper specimen collection/handling, submission of specimen other than nasopharyngeal swab, presence of viral mutation(s) within the areas targeted by this assay, and inadequate number of viral copies (<131 copies/mL). A negative result must be combined with clinical observations, patient history, and epidemiological information. The expected result is Negative.  Fact Sheet for Patients:  PinkCheek.be  Fact Sheet for Healthcare Providers:  GravelBags.it  This test is no t yet approved or cleared by the [place] FDA and  has been authorized for detection and/or diagnosis of SARS-CoV-2 by FDA under an Emergency Use Authorization (EUA). This EUA will remain  in  effect (meaning this test can be used) for the duration of the COVID-19 declaration under Section 564(b)(1) of the Act, 21 U.S.C. section 360bbb-3(b)(1), unless the authorization is terminated or revoked sooner.     Influenza A by  PCR NEGATIVE NEGATIVE Final   Influenza B by PCR NEGATIVE NEGATIVE Final    Comment: (NOTE) The Xpert Xpress SARS-CoV-2/FLU/RSV assay is intended as an aid in  the diagnosis of influenza from Nasopharyngeal swab specimens and  should not be used as a sole basis for treatment. Nasal washings and  aspirates are unacceptable for Xpert Xpress SARS-CoV-2/FLU/RSV  testing.  Fact Sheet for Patients: PinkCheek.be  Fact Sheet for Healthcare Providers: GravelBags.it  This test is not yet approved or cleared by the [place] FDA and  has been authorized for detection and/or diagnosis of SARS-CoV-2 by  FDA under an Emergency Use Authorization (EUA). This EUA will remain  in effect (meaning this test can be used) for the duration of the  Covid-19 declaration under Section 564(b)(1) of the Act, 21  U.S.C. section 360bbb-3(b)(1), unless the authorization is  terminated or revoked. Performed at [facility], [address] [place], [place] [address]   Urine culture     Status: Abnormal   Collection Time: 12/18/19 12:52 PM   Specimen: Urine, Random  Result Value Ref Range Status   Specimen Description   Final    URINE, RANDOM Performed at [facility], [address] [place], [place] [address]    Special Requests   Final    NONE Performed at [facility], [address] [place], [place] [address]    Culture >=100,000 COLONIES/mL ESCHERICHIA COLI (A)  Final   Report Status 12/21/2019 FINAL  Final   Organism ID, Bacteria ESCHERICHIA COLI (A)  Final      Susceptibility   Escherichia coli - MIC*    AMPICILLIN <=2 SENSITIVE Sensitive     CEFAZOLIN <=4 SENSITIVE Sensitive      CEFTRIAXONE <=0.25 SENSITIVE Sensitive     CIPROFLOXACIN <=0.25 SENSITIVE Sensitive     GENTAMICIN <=1 SENSITIVE Sensitive     IMIPENEM 0.5 SENSITIVE Sensitive     NITROFURANTOIN <=16 SENSITIVE Sensitive     TRIMETH/SULFA <=20 SENSITIVE Sensitive     AMPICILLIN/SULBACTAM <=2 SENSITIVE Sensitive     PIP/TAZO <=4 SENSITIVE Sensitive     * >=100,000 COLONIES/mL ESCHERICHIA COLI  Blood culture (routine x 2)     Status: None   Collection Time: 12/18/19 12:53 PM   Specimen: BLOOD  Result Value Ref Range Status   Specimen Description BLOOD RIGHT HAND  Final   Special Requests   Final    BOTTLES DRAWN AEROBIC AND ANAEROBIC Blood Culture adequate volume   Culture   Final    NO GROWTH 5 DAYS Performed at [facility], [address], [place], [place] [address]    Report Status 12/23/2019 FINAL  Final      Imaging Studies   No results found.   Medications   Scheduled Meds:  amLODipine  10 mg Oral QHS   ARIPiprazole  1 mg Oral Daily   docusate sodium  100 mg Oral Daily   donepezil  5 mg Oral QHS   feeding supplement (ENSURE ENLIVE)  237 mL Oral TID BM   losartan  100 mg Oral Daily   melatonin  2.5 mg Oral QHS   OLANZapine zydis  5 mg Oral BID   phenazopyridine  100 mg Oral TID WC   polyethylene glycol  17 g Oral Daily   QUEtiapine  50 mg Oral QHS   senna  1 tablet Oral BID   sertraline  100 mg Oral Daily   sodium chloride flush  3 mL Intravenous Q12H  Continuous Infusions:     LOS: 6 days    Time spent: 25 minutes with >50% spent in coordination of care and direct patient contact.    Ezekiel Slocumb, DO [facility]  12/25/2019, 2:10 PM    If 7PM-7AM, please contact night-coverage. How to contact the [facility] Attending or Consulting provider [place] or covering provider during after hours [place], for this patient?    1. Check the care team in [facility] and look for a) attending/consulting [facility] provider listed and b) the [facility] team  listed 2. Log into [email or web address] and use [place] universal password to access. If you do not have the password, please contact the hospital operator. 3. Locate the [facility] provider you are looking for under [facility] and page to a number that you can be directly reached. 4. If you still have difficulty reaching the provider, please page the [facility] (Director on Call) for the Hospitalists listed on amion for assistance.

## 2019-12-26 DIAGNOSIS — G309 Alzheimer's disease, unspecified: Secondary | ICD-10-CM | POA: Diagnosis not present

## 2019-12-26 DIAGNOSIS — F0281 Dementia in other diseases classified elsewhere with behavioral disturbance: Secondary | ICD-10-CM | POA: Diagnosis not present

## 2019-12-26 DIAGNOSIS — Z515 Encounter for palliative care: Secondary | ICD-10-CM | POA: Diagnosis not present

## 2019-12-26 MED ORDER — SIMETHICONE 40 MG/0.6ML PO SUSP
40.0000 mg | Freq: Three times a day (TID) | ORAL | Status: DC | PRN
  Filled 2019-12-26: qty 0.6

## 2019-12-26 NOTE — Progress Notes (Signed)
PROGRESS NOTE    Amanda Hancock   ZCH:[id number]  DOB: [birth year]  PCP: Pleas Koch, NP    DOA: 12/18/2019 LOS: 7   Brief Narrative   Summary from Dr. Kurtis Bushman 10/5: "Amanda Hancock is a 74 y.o. 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 She has known non-obstructing nephrolithiasis and prior episodes of hematuria due to cystitis."   Patient was treated for UTI / hemorrhagic cystitis with 5 day course of Rocephin.   10/3 to 10/8 - patient has had ongoing agitation, combativeness, minimal oral intake, refusing medications.  Family met with palliative care and have transitioned to comfort-focused care.  Awaiting hospice placement.      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  Awaiting hospice placement.  Medications for comfort per orders.   No further labs or vitals per family request as this provokes her agitation.     Altered mental status -present at admission - superimposed on baseline dementia.  Due to infection most likely, hospital delirium contributing now that infection has been treated.  Patient refusing to take medications, frequently agitated and combative with family and staff.   Continue supportive care, reorientation, encourage p.o. intake --Delirium precautions:     -Lights and TV off, minimize interruptions at night    -Blinds open and lights on during day    -Glasses/hearing aid with patient    -Frequent  reorientation    -PT/OT when able    -Avoid sedation medications/[place] list medications --other mgmt as below for dementia  Moderate severity right-sided hydronephrosis/Bilateral nonobstructing renal stones- Urology consulted and reported that the stone is too large to pass and may be getting larger over time, did not require any urgent intervention.  May need ureteroscopy versus shockwave lithotripsy but family will consider.  Plan is to see patient as outpatient to plan therapy.  Patient will need to follow-up as outpatient with urology on discharge.   Urology signed off on 10/3.  UTI/hemorrhagic cystitis- no evidence of sepsis, no leukocystitis. Urine cultures grew pan-sensitive E coli.   --Completed 5 days course of Rocephin --d/c IV fluids - monitor renal function and electrolytes, concern for inadequate PO intake with her agitation  Dementia (mixed Alzheimer's, Vascular, ?Lewy Body) - with behavioral disturbances (agitation, combativeness).   --Continue home Aricept, Seroquel at bedtime, Zoloft.   --Followed by palliative at home. --Psychiatry consulted for medication recommendations --Added Abilify 9m qAM --SL or IV Haldol PRN --IM Thorazine TID PRN psychosis --Zyprexa ODT or IM BID --Avoid benzos - worsens agitation  Hypertension - poorly controlled on home amlodipine and losartan.  Increased losartan to 100 mg daily. PO hydralazine PRN.  Generalized weakness - PT and OT recommending SNF 3-n-1.   Obesity: Body mass index is 34.88 kg/m.  Complicates overall care and prognosis.  DVT prophylaxis:    Diet:  Diet Orders (From admission, onward)    Start     Ordered   12/25/19 1237  Diet regular Room service appropriate? Yes; Fluid  consistency: Thin  Diet effective now       Question Answer Comment  Room service appropriate? Yes   Fluid consistency: Thin      12/25/19 1237            Code Status: DNR    Subjective 12/26/19    Patient seen with daughter,  husband, niece and daughter-in-law at bedside this AM.  No reports of significant agitation or combativeness.  Patient took her morning meds today without resistance (first time in several days).  Patient denies pain or discomfort.  Family reported she had abdominal and leg pain earlier, but patient currently denies these.  Has not had a BM for a few days.    Disposition Plan & Communication   Status is: Inpatient  Remains inpatient appropriate because: Awaiting hospice placement    Dispo: The patient is from: Home              Anticipated d/c is to: Hospice              Anticipated d/c date is: 1-2 days              Patient currently IS medically stable for discharge.     Family Communication: several family members at bedside on rounds this AM   Consults, Procedures, Significant Events   Consultants:   Urology  Palliative Care  Psychiatry    Antimicrobials:  Anti-infectives (From admission, onward)   Start     Dose/Rate Route Frequency Ordered Stop   12/19/19 1400  cefTRIAXone (ROCEPHIN) 1 g in sodium chloride 0.9 % 100 mL IVPB        1 g 200 mL/hr over 30 Minutes Intravenous Every 24 hours 12/19/19 1111 12/22/19 1537   12/19/19 0000  cefTRIAXone (ROCEPHIN) 1 g in sodium chloride 0.9 % 100 mL IVPB  Status:  Discontinued        1 g 200 mL/hr over 30 Minutes Intravenous Every 24 hours 12/18/19 1833 12/18/19 1834   12/18/19 2200  cefdinir (OMNICEF) capsule 300 mg  Status:  Discontinued        300 mg Oral Every 12 hours 12/18/19 1838 12/18/19 1900   12/18/19 2000  cefdinir (OMNICEF) capsule 300 mg  Status:  Discontinued        300 mg Oral Every 12 hours 12/18/19 1900 12/19/19 1111   12/18/19 1400  cefTRIAXone (ROCEPHIN) 1 g in sodium chloride 0.9 % 100 mL IVPB        1 g 200 mL/hr over 30 Minutes Intravenous  Once 12/18/19 1359 12/18/19 1726         Objective   Vitals:   12/24/19 0646 12/24/19 0800 12/25/19 0746 12/26/19 0754  BP: (!) 146/62     Pulse: 60       Resp:  16 20   Temp:    98 F (36.7 C)  TempSrc: Oral   Oral  SpO2:      Weight:      Height:        Intake/Output Summary (Last 24 hours) at 12/26/2019 1435 Last data filed at 12/25/2019 2130 Gross per 24 hour  Intake 240 ml  Output --  Net 240 ml   Filed Weights   12/18/19 1229  Weight: 81 kg    Physical Exam:  General exam: awake, alert, no acute distress, calm  Respiratory system: CTAB, normal respiratory effort, no wheezing or rhonchi. Cardiovascular system: normal S1/S2, RRR, no pedal edema.   Gastrointestinal system: soft, NT,  ND, +bowel sounds (hypoactive) Central nervous system: oriented to self, no gross focal neurologic deficits, normal speech    Labs   Data Reviewed: I have personally reviewed following labs and imaging studies  CBC: Recent Labs  Lab 12/23/19 0523  WBC 4.9  HGB 10.7*  HCT 31.1*  MCV 83.4  PLT 697   Basic Metabolic Panel: Recent Labs  Lab 12/23/19 0523  NA 143  K 3.8  CL 108  CO2 28  GLUCOSE 99  BUN 9  CREATININE 0.68  CALCIUM 10.0   GFR: Estimated Creatinine Clearance: 58.1 mL/min (by C-G formula based on SCr of 0.68 mg/dL). Liver Function Tests: No results for input(s): AST, ALT, ALKPHOS, BILITOT, PROT, ALBUMIN in the last 168 hours. No results for input(s): LIPASE, AMYLASE in the last 168 hours. No results for input(s): AMMONIA in the last 168 hours. Coagulation Profile: No results for input(s): INR, PROTIME in the last 168 hours. Cardiac Enzymes: No results for input(s): CKTOTAL, CKMB, CKMBINDEX, TROPONINI in the last 168 hours. BNP (last 3 results) No results for input(s): PROBNP in the last 8760 hours. HbA1C: No results for input(s): HGBA1C in the last 72 hours. CBG: No results for input(s): GLUCAP in the last 168 hours. Lipid Profile: No results for input(s): CHOL, HDL, LDLCALC, TRIG, CHOLHDL, LDLDIRECT in the last 72 hours. Thyroid Function Tests: No results for input(s): TSH, T4TOTAL, FREET4, T3FREE,  THYROIDAB in the last 72 hours. Anemia Panel: No results for input(s): VITAMINB12, FOLATE, FERRITIN, TIBC, IRON, RETICCTPCT in the last 72 hours. Sepsis Labs: No results for input(s): PROCALCITON, LATICACIDVEN in the last 168 hours.  Recent Results (from the past 240 hour(s))  Blood culture (routine x 2)     Status: None   Collection Time: 12/18/19 12:52 PM   Specimen: BLOOD  Result Value Ref Range Status   Specimen Description BLOOD LAC  Final   Special Requests   Final    BOTTLES DRAWN AEROBIC AND ANAEROBIC Blood Culture adequate volume   Culture   Final    NO GROWTH 5 DAYS Performed at [facility], [address] [place], [place] [address]    Report Status 12/23/2019 FINAL  Final  Respiratory Panel by RT PCR (Flu A&B, Covid) - Urine, Clean Catch     Status: None   Collection Time: 12/18/19 12:52 PM   Specimen: Urine, Clean Catch  Result Value Ref Range Status   SARS Coronavirus 2 by RT PCR NEGATIVE NEGATIVE Final    Comment: (NOTE) SARS-CoV-2 target nucleic acids are NOT DETECTED.  The SARS-CoV-2 RNA is generally detectable in upper respiratoy specimens during the acute phase of infection. The lowest concentration of SARS-CoV-2 viral copies this assay can detect is 131 copies/mL. A negative result does not preclude SARS-Cov-2 infection and should not be used as the sole basis for treatment or other patient management decisions. A negative result may occur with  improper specimen collection/handling, submission of specimen other than nasopharyngeal swab, presence of viral mutation(s) within the areas targeted by this assay, and inadequate number of viral copies (<131 copies/mL). A negative result must be combined with clinical observations, patient history, and epidemiological information. The expected result is Negative.  Fact Sheet for Patients:  PinkCheek.be  Fact Sheet for Healthcare Providers:    GravelBags.it  This test is no t yet approved or cleared by the [place] FDA and  has been authorized for detection and/or diagnosis of SARS-CoV-2 by FDA under an Emergency Use Authorization (EUA). This EUA will  remain  in effect (meaning this test can be used) for the duration of the COVID-19 declaration under Section 564(b)(1) of the Act, 21 U.S.C. section 360bbb-3(b)(1), unless the authorization is terminated or revoked sooner.     Influenza A by PCR NEGATIVE NEGATIVE Final   Influenza B by PCR NEGATIVE NEGATIVE Final    Comment: (NOTE) The Xpert Xpress SARS-CoV-2/FLU/RSV assay is intended as an aid in  the diagnosis of influenza from Nasopharyngeal swab specimens and  should not be used as a sole basis for treatment. Nasal washings and  aspirates are unacceptable for Xpert Xpress SARS-CoV-2/FLU/RSV  testing.  Fact Sheet for Patients: PinkCheek.be  Fact Sheet for Healthcare Providers: GravelBags.it  This test is not yet approved or cleared by the [place] FDA and  has been authorized for detection and/or diagnosis of SARS-CoV-2 by  FDA under an Emergency Use Authorization (EUA). This EUA will remain  in effect (meaning this test can be used) for the duration of the  Covid-19 declaration under Section 564(b)(1) of the Act, 21  U.S.C. section 360bbb-3(b)(1), unless the authorization is  terminated or revoked. Performed at [facility], [address] [place], [place] [address]   Urine culture     Status: Abnormal   Collection Time: 12/18/19 12:52 PM   Specimen: Urine, Random  Result Value Ref Range Status   Specimen Description   Final    URINE, RANDOM Performed at [facility], [address] [place], [place] [address]    Special Requests   Final    NONE Performed at [facility], [address] [place], [place] [address]    Culture  >=100,000 COLONIES/mL ESCHERICHIA COLI (A)  Final   Report Status 12/21/2019 FINAL  Final   Organism ID, Bacteria ESCHERICHIA COLI (A)  Final      Susceptibility   Escherichia coli - MIC*    AMPICILLIN <=2 SENSITIVE Sensitive     CEFAZOLIN <=4 SENSITIVE Sensitive     CEFTRIAXONE <=0.25 SENSITIVE Sensitive     CIPROFLOXACIN <=0.25 SENSITIVE Sensitive     GENTAMICIN <=1 SENSITIVE Sensitive     IMIPENEM 0.5 SENSITIVE Sensitive     NITROFURANTOIN <=16 SENSITIVE Sensitive     TRIMETH/SULFA <=20 SENSITIVE Sensitive     AMPICILLIN/SULBACTAM <=2 SENSITIVE Sensitive     PIP/TAZO <=4 SENSITIVE Sensitive     * >=100,000 COLONIES/mL ESCHERICHIA COLI  Blood culture (routine x 2)     Status: None   Collection Time: 12/18/19 12:53 PM   Specimen: BLOOD  Result Value Ref Range Status   Specimen Description BLOOD RIGHT HAND  Final   Special Requests   Final    BOTTLES DRAWN AEROBIC AND ANAEROBIC Blood Culture adequate volume   Culture   Final    NO GROWTH 5 DAYS Performed at [facility], [address], [place], [place] [address]    Report Status 12/23/2019 FINAL  Final      Imaging Studies   No results found.   Medications   Scheduled Meds: . amLODipine  10 mg Oral QHS  . ARIPiprazole  1 mg Oral Daily  . docusate sodium  100 mg Oral Daily  . donepezil  5 mg Oral QHS  . feeding supplement (ENSURE ENLIVE)  237 mL Oral TID BM  . losartan  100 mg Oral Daily  . melatonin  2.5 mg Oral QHS  . OLANZapine zydis  5 mg Oral BID  . phenazopyridine  100 mg Oral TID WC  .  polyethylene glycol  17 g Oral Daily  . QUEtiapine  50 mg Oral QHS  . senna  1 tablet Oral BID  . sertraline  100 mg Oral Daily  . sodium chloride flush  3 mL Intravenous Q12H   Continuous Infusions:     LOS: 7 days    Time spent: 20 minutes    Ezekiel Slocumb, DO [facility]  12/26/2019, 2:35 PM    If 7PM-7AM, please contact night-coverage. How to contact the [facility] Attending or Consulting  provider [place] or covering provider during after hours [place], for this patient?    1. Check the care team in [facility] and look for a) attending/consulting [facility] provider listed and b) the [facility] team listed 2. Log into [email or web address] and use [place]'s universal password to access. If you do not have the password, please contact the hospital operator. 3. Locate the [facility] provider you are looking for under [facility] and page to a number that you can be directly reached. 4. If you still have difficulty reaching the provider, please page the [facility] (Director on Call) for the Hospitalists listed on amion for assistance.

## 2019-12-27 DIAGNOSIS — F0281 Dementia in other diseases classified elsewhere with behavioral disturbance: Secondary | ICD-10-CM | POA: Diagnosis not present

## 2019-12-27 DIAGNOSIS — G309 Alzheimer's disease, unspecified: Secondary | ICD-10-CM | POA: Diagnosis not present

## 2019-12-27 DIAGNOSIS — Z515 Encounter for palliative care: Secondary | ICD-10-CM | POA: Diagnosis not present

## 2019-12-27 NOTE — Progress Notes (Signed)
PROGRESS NOTE    MASSA PE   QQV:[id number]  DOB: 07/14/1945  PCP: Pleas Koch, NP    DOA: 12/18/2019 LOS: 8   Brief Narrative   Summary from Dr. Kurtis Bushman 10/5: "Amanda Hancock is a 74 y.o. 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 She has known non-obstructing nephrolithiasis and prior episodes of hematuria due to cystitis."   Patient was treated for UTI / hemorrhagic cystitis with 5 day course of Rocephin.   10/3 to 10/8 - patient has had ongoing agitation, combativeness, minimal oral intake, refusing medications.  Family met with palliative care and have transitioned to comfort-focused care.  Awaiting hospice placement.      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  Awaiting hospice placement.  Medications for comfort per orders.   No further labs or vitals per family request as this provokes her agitation.     Altered mental status -present at admission - superimposed on baseline dementia.  Due to infection most likely, hospital delirium contributing now that infection has been treated.  Patient refusing to take medications, frequently agitated and combative with family and staff.   Continue supportive care, reorientation, encourage p.o. intake --Delirium precautions:     -Lights and TV off, minimize interruptions at night    -Blinds open and lights on during day    -Glasses/hearing aid with patient    -Frequent  reorientation    -PT/OT when able    -Avoid sedation medications/[place] list medications --other mgmt as below for dementia  Moderate severity right-sided hydronephrosis/Bilateral nonobstructing renal stones- Urology consulted and reported that the stone is too large to pass and may be getting larger over time, did not require any urgent intervention.  May need ureteroscopy versus shockwave lithotripsy but family will consider.  Plan is to see patient as outpatient to plan therapy.  Patient will need to follow-up as outpatient with urology on discharge.   Urology signed off on 10/3.  UTI/hemorrhagic cystitis- no evidence of sepsis, no leukocystitis. Urine cultures grew pan-sensitive E coli.   --Completed 5 days course of Rocephin --d/c IV fluids - monitor renal function and electrolytes, concern for inadequate PO intake with her agitation  Dementia (mixed Alzheimer's, Vascular, ?Lewy Body) - with behavioral disturbances (agitation, combativeness).   --Continue home Aricept, Seroquel at bedtime, Zoloft.   --Followed by palliative at home. --Psychiatry consulted for medication recommendations --Added Abilify 47m qAM --SL or IV Haldol PRN --IM Thorazine TID PRN psychosis --Zyprexa ODT or IM BID --Avoid benzos - worsens agitation  Hypertension - poorly controlled on home amlodipine and losartan.  Increased losartan to 100 mg daily. PO hydralazine PRN.  Generalized weakness - PT and OT recommending SNF 3-n-1.   Obesity: Body mass index is 34.88 kg/m.  Complicates overall care and prognosis.  DVT prophylaxis:    Diet:  Diet Orders (From admission, onward)    Start     Ordered   12/25/19 1237  Diet regular Room service appropriate? Yes; Fluid  consistency: Thin  Diet effective now       Question Answer Comment  Room service appropriate? Yes   Fluid consistency: Thin      12/25/19 1237            Code Status: DNR    Subjective 12/27/19    Patient seen with several  family members at bedside.  No acute events or reports of significant agitation or behavioral issues.  Pt denies any pain or discomfort, nausea or difficulty breathing.  Family report she intermittently complaints of back or leg pain.   Awaiting bed at hospice.  They ask about organ donation.   Disposition Plan & Communication   Status is: Inpatient  Remains inpatient appropriate because: Awaiting hospice placement, expect bed available as soon as tomorrow.   Dispo: The patient is from: Home              Anticipated d/c is to: Hospice              Anticipated d/c date is: 1-2 days              Patient currently IS medically stable for discharge.     Family Communication: several family members at bedside on rounds this AM   Consults, Procedures, Significant Events   Consultants:   Urology  Palliative Care  Psychiatry    Antimicrobials:  Anti-infectives (From admission, onward)   Start     Dose/Rate Route Frequency Ordered Stop   12/19/19 1400  cefTRIAXone (ROCEPHIN) 1 g in sodium chloride 0.9 % 100 mL IVPB        1 g 200 mL/hr over 30 Minutes Intravenous Every 24 hours 12/19/19 1111 12/22/19 1537   12/19/19 0000  cefTRIAXone (ROCEPHIN) 1 g in sodium chloride 0.9 % 100 mL IVPB  Status:  Discontinued        1 g 200 mL/hr over 30 Minutes Intravenous Every 24 hours 12/18/19 1833 12/18/19 1834   12/18/19 2200  cefdinir (OMNICEF) capsule 300 mg  Status:  Discontinued        300 mg Oral Every 12 hours 12/18/19 1838 12/18/19 1900   12/18/19 2000  cefdinir (OMNICEF) capsule 300 mg  Status:  Discontinued        300 mg Oral Every 12 hours 12/18/19 1900 12/19/19 1111   12/18/19 1400  cefTRIAXone (ROCEPHIN) 1 g in sodium chloride 0.9 % 100 mL IVPB        1 g 200 mL/hr over 30 Minutes Intravenous  Once 12/18/19 1359 12/18/19 1726         Objective   Vitals:   12/24/19 0646 12/24/19 0800 12/25/19 0746 12/26/19 0754  BP: (!) 146/62     Pulse: 60     Resp:  16 20   Temp:     98 F (36.7 C)  TempSrc: Oral   Oral  SpO2:      Weight:      Height:        Intake/Output Summary (Last 24 hours) at 12/27/2019 1453 Last data filed at 12/27/2019 1414 Gross per 24 hour  Intake 0 ml  Output --  Net 0 ml   Filed Weights   12/18/19 1229  Weight: 81 kg    Physical Exam:  General exam: awake, alert, no acute distress, calm  Respiratory system: CTAB, normal respiratory effort, no wheezing or rhonchi. Cardiovascular system: normal S1/S2, RRR, no pedal edema.   Gastrointestinal system: soft, NT, ND, +bowel sounds (hypoactive) Central nervous system:  oriented to self, no gross focal neurologic deficits, normal speech    Labs   Data Reviewed: I have personally reviewed following labs and imaging studies  CBC: Recent Labs  Lab 12/23/19 0523  WBC 4.9  HGB 10.7*  HCT 31.1*  MCV 83.4  PLT 947   Basic Metabolic Panel: Recent Labs  Lab 12/23/19 0523  NA 143  K 3.8  CL 108  CO2 28  GLUCOSE 99  BUN 9  CREATININE 0.68  CALCIUM 10.0   GFR: Estimated Creatinine Clearance: 58.1 mL/min (by C-G formula based on SCr of 0.68 mg/dL). Liver Function Tests: No results for input(s): AST, ALT, ALKPHOS, BILITOT, PROT, ALBUMIN in the last 168 hours. No results for input(s): LIPASE, AMYLASE in the last 168 hours. No results for input(s): AMMONIA in the last 168 hours. Coagulation Profile: No results for input(s): INR, PROTIME in the last 168 hours. Cardiac Enzymes: No results for input(s): CKTOTAL, CKMB, CKMBINDEX, TROPONINI in the last 168 hours. BNP (last 3 results) No results for input(s): PROBNP in the last 8760 hours. HbA1C: No results for input(s): HGBA1C in the last 72 hours. CBG: No results for input(s): GLUCAP in the last 168 hours. Lipid Profile: No results for input(s): CHOL, HDL, LDLCALC, TRIG, CHOLHDL, LDLDIRECT in the last 72 hours. Thyroid Function Tests: No results for input(s): TSH, T4TOTAL, FREET4, T3FREE, THYROIDAB in the last 72  hours. Anemia Panel: No results for input(s): VITAMINB12, FOLATE, FERRITIN, TIBC, IRON, RETICCTPCT in the last 72 hours. Sepsis Labs: No results for input(s): PROCALCITON, LATICACIDVEN in the last 168 hours.  Recent Results (from the past 240 hour(s))  Blood culture (routine x 2)     Status: None   Collection Time: 12/18/19 12:52 PM   Specimen: BLOOD  Result Value Ref Range Status   Specimen Description BLOOD LAC  Final   Special Requests   Final    BOTTLES DRAWN AEROBIC AND ANAEROBIC Blood Culture adequate volume   Culture   Final    NO GROWTH 5 DAYS Performed at [facility], [address] [place], [place] [address]    Report Status 12/23/2019 FINAL  Final  Respiratory Panel by RT PCR (Flu A&B, Covid) - Urine, Clean Catch     Status: None   Collection Time: 12/18/19 12:52 PM   Specimen: Urine, Clean Catch  Result Value Ref Range Status   SARS Coronavirus 2 by RT PCR NEGATIVE NEGATIVE Final    Comment: (NOTE) SARS-CoV-2 target nucleic acids are NOT DETECTED.  The SARS-CoV-2 RNA is generally detectable in upper respiratoy specimens during the acute phase of infection. The lowest concentration of SARS-CoV-2 viral copies this assay can detect is 131 copies/mL. A negative result does not preclude SARS-Cov-2 infection and should not be used as the sole basis for treatment or other patient management decisions. A negative result may occur with  improper specimen collection/handling, submission of specimen other than nasopharyngeal swab, presence of viral mutation(s) within the areas targeted by this assay, and inadequate number of viral copies (<131 copies/mL). A negative result must be combined with clinical observations, patient history, and epidemiological information. The expected result is Negative.  Fact Sheet for Patients:  PinkCheek.be  Fact Sheet for Healthcare Providers:  GravelBags.it  This test  is no t yet approved or cleared by the [place] FDA and  has been authorized for detection and/or diagnosis of SARS-CoV-2 by FDA under an Emergency Use Authorization (EUA). This EUA will remain  in effect (meaning this test can  be used) for the duration of the COVID-19 declaration under Section 564(b)(1) of the Act, 21 U.S.C. section 360bbb-3(b)(1), unless the authorization is terminated or revoked sooner.     Influenza A by PCR NEGATIVE NEGATIVE Final   Influenza B by PCR NEGATIVE NEGATIVE Final    Comment: (NOTE) The Xpert Xpress SARS-CoV-2/FLU/RSV assay is intended as an aid in  the diagnosis of influenza from Nasopharyngeal swab specimens and  should not be used as a sole basis for treatment. Nasal washings and  aspirates are unacceptable for Xpert Xpress SARS-CoV-2/FLU/RSV  testing.  Fact Sheet for Patients: PinkCheek.be  Fact Sheet for Healthcare Providers: GravelBags.it  This test is not yet approved or cleared by the [place] FDA and  has been authorized for detection and/or diagnosis of SARS-CoV-2 by  FDA under an Emergency Use Authorization (EUA). This EUA will remain  in effect (meaning this test can be used) for the duration of the  Covid-19 declaration under Section 564(b)(1) of the Act, 21  U.S.C. section 360bbb-3(b)(1), unless the authorization is  terminated or revoked. Performed at [facility], [address] [place], [place] [address]   Urine culture     Status: Abnormal   Collection Time: 12/18/19 12:52 PM   Specimen: Urine, Random  Result Value Ref Range Status   Specimen Description   Final    URINE, RANDOM Performed at [facility], [address] [place], [place] [address]    Special Requests   Final    NONE Performed at [facility], [address] [place], [place] [address]    Culture >=100,000 COLONIES/mL ESCHERICHIA COLI (A)  Final   Report  Status 12/21/2019 FINAL  Final   Organism ID, Bacteria ESCHERICHIA COLI (A)  Final      Susceptibility   Escherichia coli - MIC*    AMPICILLIN <=2 SENSITIVE Sensitive     CEFAZOLIN <=4 SENSITIVE Sensitive     CEFTRIAXONE <=0.25 SENSITIVE Sensitive     CIPROFLOXACIN <=0.25 SENSITIVE Sensitive     GENTAMICIN <=1 SENSITIVE Sensitive     IMIPENEM 0.5 SENSITIVE Sensitive     NITROFURANTOIN <=16 SENSITIVE Sensitive     TRIMETH/SULFA <=20 SENSITIVE Sensitive     AMPICILLIN/SULBACTAM <=2 SENSITIVE Sensitive     PIP/TAZO <=4 SENSITIVE Sensitive     * >=100,000 COLONIES/mL ESCHERICHIA COLI  Blood culture (routine x 2)     Status: None   Collection Time: 12/18/19 12:53 PM   Specimen: BLOOD  Result Value Ref Range Status   Specimen Description BLOOD RIGHT HAND  Final   Special Requests   Final    BOTTLES DRAWN AEROBIC AND ANAEROBIC Blood Culture adequate volume   Culture   Final    NO GROWTH 5 DAYS Performed at [facility], [address] [place], [place] [address]    Report Status 12/23/2019 FINAL  Final      Imaging Studies   No results found.   Medications   Scheduled Meds: . amLODipine  10 mg Oral QHS  . ARIPiprazole  1 mg Oral Daily  . docusate sodium  100 mg Oral Daily  . donepezil  5 mg Oral QHS  . feeding supplement (ENSURE ENLIVE)  237 mL Oral TID BM  . losartan  100 mg Oral Daily  . melatonin  2.5 mg Oral QHS  . OLANZapine zydis  5 mg Oral BID  . polyethylene glycol  17 g Oral Daily  . QUEtiapine  50 mg Oral QHS  .  senna  1 tablet Oral BID  . sertraline  100 mg Oral Daily  . sodium chloride flush  3 mL Intravenous Q12H   Continuous Infusions:     LOS: 8 days    Time spent: 20 minutes    Ezekiel Slocumb, DO [facility]  12/27/2019, 2:53 PM    If 7PM-7AM, please contact night-coverage. How to contact the [facility] Attending or Consulting provider [place] or covering provider during after hours [place], for this patient?    1. Check the  care team in [facility] and look for a) attending/consulting TR[facility] provider listed and b) the [facility] team listed 2. Log into [email or web address] and use [place]'s universal password to access. If you do not have the password, please contact the hospital operator. 3. Locate the [facility] provider you are looking for under [facility] and page to a number that you can be directly reached. 4. If you still have difficulty reaching the provider, please page the [facility] (Director on Call) for the Hospitalists listed on amion for assistance.

## 2019-12-27 NOTE — Care Management Important Message (Signed)
Important Message  Patient Details  Name: Amanda Hancock MRN: [id number] Date of Birth: [birth year]   Medicare Important Message Given:  Yes     [name] 12/27/2019, 12:01 PM

## 2019-12-27 NOTE — Progress Notes (Addendum)
[facility] Room 213 [facility]) Hospital Liaison RN note:  Received request from Dr. Esaw Grandchild for family interest in Hospice Home. Chart reviewed and eligibility has been approved. Spoke with daughter, Trula Ore in the room to confirm interest and explain services. Family verbalized understanding but had a question regarding patient's donor status. RN is checking to verify this information.   Hospice Home will have a bed tomorrow. Hospital care team is aware. Daughter, Trula Ore is to docusign paperwork this afternoon with Irving Burton at the [facility]  Please call with any hospice related questions or concerns.  Thank you for the opportunity to participate in this patient's care.  Cyndra Numbers, RN [facility] Liaison  [phone]

## 2019-12-27 NOTE — TOC Progression Note (Signed)
Transition of Care [facility]) - Progression Note    Patient Details  Name: Amanda Hancock MRN: [id number] Date of Birth: Jul 19, 1945  Transition of Care [facility]) CM/SW [place], LCSW Phone Number: 12/27/2019, 9:22 AM  Clinical Narrative:  CSW met with patient and daughter at bedside. Confirmed plan for hospice house. Preference for [place] facility. Made referral to Flo Shanks, RN at [facility].   Expected Discharge Plan: Skilled Nursing Facility Barriers to Discharge: Continued Medical Work up, Programmer, applications ([place]), Insurance Authorization  Expected Discharge Plan and Services Expected Discharge Plan: [place] Choice: [place] arrangements for the past 2 months: Single Family Home                                       Social Determinants of Health (SDOH) Interventions    Readmission Risk Interventions No flowsheet data found.

## 2019-12-28 DIAGNOSIS — G309 Alzheimer's disease, unspecified: Secondary | ICD-10-CM | POA: Diagnosis not present

## 2019-12-28 DIAGNOSIS — Z515 Encounter for palliative care: Secondary | ICD-10-CM | POA: Diagnosis not present

## 2019-12-28 DIAGNOSIS — F0281 Dementia in other diseases classified elsewhere with behavioral disturbance: Secondary | ICD-10-CM | POA: Diagnosis not present

## 2019-12-28 MED ORDER — OLANZAPINE 5 MG PO TBDP: 5.0000 mg | ORAL_TABLET | Freq: Two times a day (BID) | ORAL | Status: AC

## 2019-12-28 MED ORDER — ONDANSETRON 4 MG PO TBDP: 4.0000 mg | ORAL_TABLET | Freq: Four times a day (QID) | ORAL | 0 refills | Status: AC | PRN

## 2019-12-28 MED ORDER — POLYETHYLENE GLYCOL 3350 17 G PO PACK: 17.0000 g | PACK | Freq: Every day | ORAL | 0 refills | Status: AC

## 2019-12-28 MED ORDER — BIOTENE DRY MOUTH MT LIQD: 15.0000 mL | OROMUCOSAL | Status: AC | PRN

## 2019-12-28 MED ORDER — ENSURE ENLIVE PO LIQD: 237.0000 mL | Freq: Three times a day (TID) | ORAL | 12 refills | Status: DC

## 2019-12-28 MED ORDER — CHLORPROMAZINE HCL 25 MG/ML IJ SOLN: 25.0000 mg | Freq: Three times a day (TID) | INTRAMUSCULAR | Status: DC | PRN

## 2019-12-28 MED ORDER — SIMETHICONE 40 MG/0.6ML PO SUSP: 40.0000 mg | Freq: Three times a day (TID) | ORAL | 0 refills | Status: AC | PRN

## 2019-12-28 MED ORDER — AMLODIPINE BESYLATE 10 MG PO TABS: 10.0000 mg | ORAL_TABLET | Freq: Every day | ORAL | Status: DC

## 2019-12-28 MED ORDER — ACETAMINOPHEN 325 MG PO TABS: 650.0000 mg | ORAL_TABLET | Freq: Four times a day (QID) | ORAL | Status: AC | PRN

## 2019-12-28 MED ORDER — DOCUSATE SODIUM 100 MG PO CAPS: 100.0000 mg | ORAL_CAPSULE | Freq: Every day | ORAL | 0 refills | Status: AC

## 2019-12-28 MED ORDER — HALOPERIDOL LACTATE 2 MG/ML PO CONC: 1.0000 mg | ORAL | 0 refills | Status: AC | PRN

## 2019-12-28 MED ORDER — MELATONIN 5 MG PO TABS: 2.5000 mg | ORAL_TABLET | Freq: Every day | ORAL | 0 refills | Status: DC

## 2019-12-28 MED ORDER — LOSARTAN POTASSIUM 100 MG PO TABS: 100.0000 mg | ORAL_TABLET | Freq: Every day | ORAL | Status: DC

## 2019-12-28 MED ORDER — POLYVINYL ALCOHOL 1.4 % OP SOLN: 1.0000 [drp] | Freq: Four times a day (QID) | OPHTHALMIC | 0 refills | Status: AC | PRN

## 2019-12-28 MED ORDER — GLYCOPYRROLATE 1 MG PO TABS: 1.0000 mg | ORAL_TABLET | ORAL | Status: AC | PRN

## 2019-12-28 MED ORDER — ARIPIPRAZOLE 2 MG PO TABS: 1.0000 mg | ORAL_TABLET | Freq: Every day | ORAL | Status: DC

## 2019-12-28 MED ORDER — ACETAMINOPHEN 650 MG RE SUPP: 650.0000 mg | Freq: Four times a day (QID) | RECTAL | 0 refills | Status: AC | PRN

## 2019-12-28 MED ORDER — MORPHINE SULFATE (PF) 2 MG/ML IV SOLN: 2.0000 mg | INTRAVENOUS | 0 refills | Status: AC | PRN

## 2019-12-28 MED ORDER — SENNA 8.6 MG PO TABS: 1.0000 | ORAL_TABLET | Freq: Two times a day (BID) | ORAL | 0 refills | Status: AC

## 2019-12-28 NOTE — Progress Notes (Signed)
Discharge information given to daughter([name]-HIPPA) at bedside and EMS transport. Daughter verbalized understanding. No further questions or concerns at this time.   Luvenia Starch

## 2019-12-28 NOTE — Discharge Summary (Addendum)
Physician Discharge Summary  Amanda Hancock HQR:[id number] DOB: 02-27-1946 DOA: 12/18/2019  PCP: Pleas Koch, NP  Admit date: 12/18/2019 Discharge date: 12/28/2019  Admitted From: home Disposition:  hospice  Recommendations for Outpatient Follow-up:  1. None  Home Health: No  Equipment/Devices: None   Discharge Condition: Stable  CODE STATUS: DNR  Diet recommendation: Regular, for comfort  Discharge Diagnoses: Principal Problem:   Comfort measures only status Active Problems:   HTN (hypertension)   Major depression with psychotic features (HCC)   Mixed Alzheimer's and vascular dementia ([place])   Gross hematuria   Nephrolithiasis   Hydronephrosis of right kidney   UTI (urinary tract infection)   Dementia with behavioral disturbance ([place])   Hemorrhagic cystitis    Summary of HPI and Hospital Course:  Summary from Dr. Kurtis Bushman 10/5: "Amanda Hancock is a 74 y.o. 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 She has known non-obstructing nephrolithiasis and prior episodes of hematuria due to cystitis."   Patient was treated for UTI / hemorrhagic cystitis with 5 day course of Rocephin.   10/3 to 10/8 - patient has had ongoing agitation, combativeness, minimal oral intake, refusing medications.  Family met with palliative care and have transitioned to comfort-focused care.    Patient is clinically stable for transfer to hospice today.    Comfort Care Status - as of afternoon of 10/8.  Awaiting hospice placement.  Medications for comfort per orders.   No further labs or vitals per family request as this provokes her agitation.     Altered mental status -present at admission - superimposed on baseline dementia.  Due to infection most likely, hospital delirium contributing now that infection has been treated.   Patient refusing to take medications, frequently agitated and combative with family and staff.   Continue supportive care, reorientation, encourage p.o. intake --Delirium precautions:  -Lights and TV off, minimize interruptions at night -Blinds open and lights on during day -Glasses/hearing aid with patient -Frequent reorientation -PT/OT when able -Avoid sedation medications/Beers list medications --other mgmt as below for dementia   Dementia (mixed Alzheimer's, Vascular, ?Lewy Body) - with behavioral disturbances (agitation, combativeness).   --Continue home Aricept, Seroquel at bedtime, Zoloft.   --Psychiatry consulted for medication recommendations --Added Abilify 104m qAM --SL or IV Haldol PRN --IM Thorazine TID PRN psychosis --Zyprexa ODT or IM BID --Avoid benzos! - worsens agitation --Prior to admission, followed by palliative at home.  Moderate severity right-sided hydronephrosis/Bilateral nonobstructing renal stones- Urology consulted and reported that the stone is too large to pass and may be getting larger over time, did not require any urgent intervention.  Prior to hospice status: "May need ureteroscopy versus shockwave lithotripsy". Plan was to see patient as outpatient to plan therapy, prior to comfort status.   Urology signed off on 10/3.  UTI/hemorrhagic cystitis- no evidence of sepsis, no leukocystitis. Urine cultures grew pan-sensitive E coli.   --Completed 5 days course of Rocephin  Hypertension - poorly controlled on home amlodipine and losartan.  Increased losartan to 100 mg daily.  Discussed continuing BP meds with family and they would like to keep them for now, if patient will take.  From comfort standpoint, treating BP may help prevent headaches or other symptoms of uncontrolled BP.  Generalized weakness - PT and OT recommended SNF 3-n-1 prior to transition to comfort.  Obesity: Body mass index is 34.88 kg/m.  Complicates  overall  care and prognosis   Discharge Instructions   Discharge Instructions    Call MD for:  persistant nausea and vomiting   Complete by: As directed    Call MD for:  severe uncontrolled pain   Complete by: As directed    Diet - low sodium heart healthy   Complete by: As directed    Increase activity slowly   Complete by: As directed      Allergies as of 12/28/2019      Reactions   Codeine Nausea And Vomiting, Other (See Comments)   headache   Ace Inhibitors Cough   Hctz [hydrochlorothiazide]    Rash      Medication List    STOP taking these medications   amLODipine 10 MG tablet Commonly known as: NORVASC   aspirin 81 MG tablet   B-12 PO   donepezil 5 MG tablet Commonly known as: ARICEPT   Ibuprofen-diphenhydrAMINE HCl 200-25 MG Caps   losartan 50 MG tablet Commonly known as: COZAAR   melatonin 3 MG Tabs tablet   MULTIVITAMIN ADULT PO     TAKE these medications   acetaminophen 325 MG tablet Commonly known as: TYLENOL Take 2 tablets (650 mg total) by mouth every 6 (six) hours as needed for mild pain (or Fever >/= 101).   acetaminophen 650 MG suppository Commonly known as: TYLENOL Place 1 suppository (650 mg total) rectally every 6 (six) hours as needed for mild pain (or Fever >/= 101).   antiseptic oral rinse Liqd Apply 15 mLs topically as needed for dry mouth.   docusate sodium 100 MG capsule Commonly known as: COLACE Take 1 capsule (100 mg total) by mouth daily.   glycopyrrolate 1 MG tablet Commonly known as: ROBINUL Take 1 tablet (1 mg total) by mouth every 4 (four) hours as needed (excessive secretions).   haloperidol 2 MG/ML solution Commonly known as: HALDOL Place 0.5 mLs (1 mg total) under the tongue every 4 (four) hours as needed for agitation (or delirium).   morphine 2 MG/ML injection Inject 1-1.5 mLs (2-3 mg total) into the vein every 2 (two) hours as needed (or dyspnea. 2 mg for moderate symptoms, 3 mg for severe symptoms.).   OLANZapine  zydis 5 MG disintegrating tablet Commonly known as: ZYPREXA Take 1 tablet (5 mg total) by mouth 2 (two) times daily.   ondansetron 4 MG disintegrating tablet Commonly known as: ZOFRAN-ODT Take 1 tablet (4 mg total) by mouth every 6 (six) hours as needed for nausea.   polyethylene glycol 17 g packet Commonly known as: MIRALAX / GLYCOLAX Take 17 g by mouth daily.   polyvinyl alcohol 1.4 % ophthalmic solution Commonly known as: LIQUIFILM TEARS Place 1 drop into both eyes 4 (four) times daily as needed for dry eyes.   QUEtiapine 50 MG tablet Commonly known as: SEROQUEL Take 50 mg by mouth at bedtime.   senna 8.6 MG Tabs tablet Commonly known as: SENOKOT Take 1 tablet (8.6 mg total) by mouth 2 (two) times daily. Hold if loose or frequent stools   sertraline 100 MG tablet Commonly known as: ZOLOFT TAKE 1 TABLET BY MOUTH ONCE DAILY   simethicone 40 MG/0.6ML drops Commonly known as: MYLICON Take 0.6 mLs (40 mg total) by mouth 3 (three) times daily as needed (gas pains).       Follow-up Information    Festus Aloe, MD. Call.   Specialty: Urology Contact information: [employer] [address] [place] [address] [phone]  Allergies  Allergen Reactions  . Codeine Nausea And Vomiting and Other (See Comments)    headache  . Ace Inhibitors Cough  . Hctz [Hydrochlorothiazide]     Rash     Consultations:  Urology  Palliative Care    Procedures/Studies: DG Chest 1 View  Result Date: 12/18/2019 CLINICAL DATA:  Non smoker, no hx of disease per chart. Pt unable to give answers. AMS. EXAM: CHEST  1 VIEW COMPARISON:  01/27/2018 FINDINGS: Lungs are clear. Heart size and mediastinal contours are within normal limits. Aortic Atherosclerosis (ICD10-170.0). No effusion.  No pneumothorax. Visualized bones unremarkable. IMPRESSION: No acute cardiopulmonary disease. Electronically Signed   By: Lucrezia Europe M.D.   On: 12/18/2019 13:38   CT Head Wo  Contrast  Result Date: 12/18/2019 CLINICAL DATA:  Confusion, head injury. EXAM: CT HEAD WITHOUT CONTRAST TECHNIQUE: Contiguous axial images were obtained from the base of the skull through the vertex without intravenous contrast. COMPARISON:  Jul 25, 2019. FINDINGS: Brain: Mild diffuse cortical atrophy is noted. Mild chronic ischemic white matter disease is noted. No mass effect or midline shift is noted. Ventricular size is within normal limits. There is no evidence of mass lesion, hemorrhage or acute infarction. Vascular: No hyperdense vessel or unexpected calcification. Skull: Normal. Negative for fracture or focal lesion. Sinuses/Orbits: No acute finding. Other: None. IMPRESSION: Mild diffuse cortical atrophy. Mild chronic ischemic white matter disease. No acute intracranial abnormality seen. Electronically Signed   By: Marijo Conception M.D.   On: 12/18/2019 13:20   CT Renal Stone Study  Result Date: 12/18/2019 CLINICAL DATA:  Painful urination. EXAM: CT ABDOMEN AND PELVIS WITHOUT CONTRAST TECHNIQUE: Multidetector CT imaging of the abdomen and pelvis was performed following the standard protocol without IV contrast. COMPARISON:  September 24, 2019 FINDINGS: Lower chest: There is a very small pericardial effusion. Hepatobiliary: No focal liver abnormality is seen. No gallstones, gallbladder wall thickening, or biliary dilatation. Pancreas: Unremarkable. No pancreatic ductal dilatation or surrounding inflammatory changes. Spleen: Normal in size without focal abnormality. Adrenals/Urinary Tract: Mild to moderate severity diffuse bilateral adrenal gland enlargement is seen. Kidneys are normal in size, without focal lesions. A 1.3 cm renal stone is seen within the right renal pelvis this is unchanged in position when compared to the prior study mild to moderate severity right-sided hydronephrosis is also seen. An additional 2 mm nonobstructing renal stone is seen within the mid right kidney. A cluster of 2 mm, 4 mm and  5 mm nonobstructing renal stones are seen within the posterior aspect of the mid left kidney. Bladder is unremarkable. Stomach/Bowel: Stomach is within normal limits. Appendix appears normal. No evidence of bowel wall thickening, distention, or inflammatory changes. Noninflamed diverticula are seen throughout the large bowel. Vascular/Lymphatic: There is moderate severity calcification of the abdominal aorta and bilateral common iliac arteries, without evidence of aneurysmal dilatation. No enlarged abdominal or pelvic lymph nodes. Reproductive: Status post hysterectomy. No adnexal masses. Other: No abdominal wall hernia or abnormality. No abdominopelvic ascites. Musculoskeletal: Multilevel degenerative changes seen throughout the lumbar spine IMPRESSION: 1. 1.3 cm renal stone within the right renal pelvis with mild to moderate severity right-sided hydronephrosis. 2. Bilateral nonobstructing renal stones. 3. Colonic diverticulosis. 4. Very small pericardial effusion. 5. Aortic atherosclerosis. Aortic Atherosclerosis (ICD10-I70.0). Electronically Signed   By: Virgina Norfolk M.D.   On: 12/18/2019 15:06   DG HIP UNILAT WITH PELVIS 2-3 VIEWS LEFT  Result Date: 12/19/2019 CLINICAL DATA:  Left hip pain.  Multiple falls.  Dementia. EXAM: DG  HIP (WITH OR WITHOUT PELVIS) 2-3V LEFT COMPARISON:  12/18/2019 CT abdomen/pelvis FINDINGS: No left hip fracture or dislocation. No pelvic fracture or diastasis. Mild bilateral hip osteoarthritis. Small enthesophytes at the superior left greater trochanter. Degenerative changes in the visualized lower lumbar spine. Right renal pelvis 12 mm stone. Additional smaller stones overlie left kidney. No suspicious focal osseous lesions. IMPRESSION: 1. No left hip fracture or dislocation. Mild bilateral hip osteoarthritis. 2. Right renal pelvis 12 mm stone. Additional smaller left renal stones. Electronically Signed   By: Ilona Sorrel M.D.   On: 12/19/2019 05:03       Subjective: Pt  seen with family at bedside.  No acute events or reports of major agitation.  Pt denies pain or discomfort this morning.     Discharge Exam: Vitals:   12/25/19 0746 12/26/19 0754  Pulse:    Resp: 20   Temp:  98 F (36.7 C)   Vitals:   12/24/19 0646 12/24/19 0800 12/25/19 0746 12/26/19 0754  BP: (!) 146/62     Pulse: 60     Resp:  16 20   Temp:    98 F (36.7 C)  TempSrc: Oral   Oral  SpO2:      Weight:      Height:        General: Pt is alert, awake, not in acute distress Cardiovascular: RRR, S1/S2 +, no rubs, no gallops Respiratory: CTA bilaterally, no wheezing, no rhonchi Abdominal: Soft, NT, ND, bowel sounds + Extremities: no edema, no cyanosis    The results of significant diagnostics from this hospitalization (including imaging, microbiology, ancillary and laboratory) are listed below for reference.     Microbiology: Recent Results (from the past 240 hour(s))  Blood culture (routine x 2)     Status: None   Collection Time: 12/18/19 12:52 PM   Specimen: BLOOD  Result Value Ref Range Status   Specimen Description BLOOD LAC  Final   Special Requests   Final    BOTTLES DRAWN AEROBIC AND ANAEROBIC Blood Culture adequate volume   Culture   Final    NO GROWTH 5 DAYS Performed at [facility], [address] [place], [place] [address]    Report Status 12/23/2019 FINAL  Final  Respiratory Panel by RT PCR (Flu A&B, Covid) - Urine, Clean Catch     Status: None   Collection Time: 12/18/19 12:52 PM   Specimen: Urine, Clean Catch  Result Value Ref Range Status   SARS Coronavirus 2 by RT PCR NEGATIVE NEGATIVE Final    Comment: (NOTE) SARS-CoV-2 target nucleic acids are NOT DETECTED.  The SARS-CoV-2 RNA is generally detectable in upper respiratoy specimens during the acute phase of infection. The lowest concentration of SARS-CoV-2 viral copies this assay can detect is 131 copies/mL. A negative result does not preclude SARS-Cov-2 infection and should not be  used as the sole basis for treatment or other patient management decisions. A negative result may occur with  improper specimen collection/handling, submission of specimen other than nasopharyngeal swab, presence of viral mutation(s) within the areas targeted by this assay, and inadequate number of viral copies (<131 copies/mL). A negative result must be combined with clinical observations, patient history, and epidemiological information. The expected result is Negative.  Fact Sheet for Patients:  PinkCheek.be  Fact Sheet for Healthcare Providers:  GravelBags.it  This test is no t yet approved or cleared by the [place] FDA and  has been authorized for detection and/or diagnosis of SARS-CoV-2 by  FDA under an Emergency Use Authorization (EUA). This EUA will remain  in effect (meaning this test can be used) for the duration of the COVID-19 declaration under Section 564(b)(1) of the Act, 21 U.S.C. section 360bbb-3(b)(1), unless the authorization is terminated or revoked sooner.     Influenza A by PCR NEGATIVE NEGATIVE Final   Influenza B by PCR NEGATIVE NEGATIVE Final    Comment: (NOTE) The Xpert Xpress SARS-CoV-2/FLU/RSV assay is intended as an aid in  the diagnosis of influenza from Nasopharyngeal swab specimens and  should not be used as a sole basis for treatment. Nasal washings and  aspirates are unacceptable for Xpert Xpress SARS-CoV-2/FLU/RSV  testing.  Fact Sheet for Patients: PinkCheek.be  Fact Sheet for Healthcare Providers: GravelBags.it  This test is not yet approved or cleared by the [place] FDA and  has been authorized for detection and/or diagnosis of SARS-CoV-2 by  FDA under an Emergency Use Authorization (EUA). This EUA will remain  in effect (meaning this test can be used) for the duration of the  Covid-19 declaration under Section  564(b)(1) of the Act, 21  U.S.C. section 360bbb-3(b)(1), unless the authorization is  terminated or revoked. Performed at [facility] [address] [place], [place] [address]   Urine culture     Status: Abnormal   Collection Time: 12/18/19 12:52 PM   Specimen: Urine, Random  Result Value Ref Range Status   Specimen Description   Final    URINE, RANDOM Performed at [facility], [address] [place], [place] [address]    Special Requests   Final    NONE Performed at [facility], [address] [place], [place] [address]    Culture >=100,000 COLONIES/mL ESCHERICHIA COLI (A)  Final   Report Status 12/21/2019 FINAL  Final   Organism ID, Bacteria ESCHERICHIA COLI (A)  Final      Susceptibility   Escherichia coli - MIC*    AMPICILLIN <=2 SENSITIVE Sensitive     CEFAZOLIN <=4 SENSITIVE Sensitive     CEFTRIAXONE <=0.25 SENSITIVE Sensitive     CIPROFLOXACIN <=0.25 SENSITIVE Sensitive     GENTAMICIN <=1 SENSITIVE Sensitive     IMIPENEM 0.5 SENSITIVE Sensitive     NITROFURANTOIN <=16 SENSITIVE Sensitive     TRIMETH/SULFA <=20 SENSITIVE Sensitive     AMPICILLIN/SULBACTAM <=2 SENSITIVE Sensitive     PIP/TAZO <=4 SENSITIVE Sensitive     * >=100,000 COLONIES/mL ESCHERICHIA COLI  Blood culture (routine x 2)     Status: None   Collection Time: 12/18/19 12:53 PM   Specimen: BLOOD  Result Value Ref Range Status   Specimen Description BLOOD RIGHT HAND  Final   Special Requests   Final    BOTTLES DRAWN AEROBIC AND ANAEROBIC Blood Culture adequate volume   Culture   Final    NO GROWTH 5 DAYS Performed at [facility], [address] [place],  [address]    Report Status 12/23/2019 FINAL  Final     Labs: BNP (last 3 results) No results for input(s): BNP in the last 8760 hours. Basic Metabolic Panel: Recent Labs  Lab 12/23/19 0523  NA 143  K 3.8  CL 108  CO2 28  GLUCOSE 99  BUN 9  CREATININE 0.68  CALCIUM 10.0   Liver  Function Tests: No results for input(s): AST, ALT, ALKPHOS, BILITOT, PROT, ALBUMIN in the last 168 hours. No results for input(s): LIPASE, AMYLASE in the last 168 hours. No results for input(s): AMMONIA  in the last 168 hours. CBC: Recent Labs  Lab 12/23/19 0523  WBC 4.9  HGB 10.7*  HCT 31.1*  MCV 83.4  PLT 165   Cardiac Enzymes: No results for input(s): CKTOTAL, CKMB, CKMBINDEX, TROPONINI in the last 168 hours. BNP: Invalid input(s): POCBNP CBG: No results for input(s): GLUCAP in the last 168 hours. D-Dimer No results for input(s): DDIMER in the last 72 hours. Hgb A1c No results for input(s): HGBA1C in the last 72 hours. Lipid Profile No results for input(s): CHOL, HDL, LDLCALC, TRIG, CHOLHDL, LDLDIRECT in the last 72 hours. Thyroid function studies No results for input(s): TSH, T4TOTAL, T3FREE, THYROIDAB in the last 72 hours.  Invalid input(s): FREET3 Anemia work up No results for input(s): VITAMINB12, FOLATE, FERRITIN, TIBC, IRON, RETICCTPCT in the last 72 hours. Urinalysis    Component Value Date/Time   COLORURINE YELLOW (A) 12/18/2019 1252   APPEARANCEUR TURBID (A) 12/18/2019 1252   LABSPEC 1.013 12/18/2019 1252   PHURINE 5.0 12/18/2019 1252   GLUCOSEU NEGATIVE 12/18/2019 1252   HGBUR LARGE (A) 12/18/2019 1252   BILIRUBINUR NEGATIVE 12/18/2019 1252   BILIRUBINUR negative 09/10/2019 1218   KETONESUR 5 (A) 12/18/2019 1252   PROTEINUR 100 (A) 12/18/2019 1252   UROBILINOGEN 0.2 09/10/2019 1218   NITRITE NEGATIVE 12/18/2019 1252   LEUKOCYTESUR MODERATE (A) 12/18/2019 1252   Sepsis Labs Invalid input(s): PROCALCITONIN,  WBC,  LACTICIDVEN Microbiology Recent Results (from the past 240 hour(s))  Blood culture (routine x 2)     Status: None   Collection Time: 12/18/19 12:52 PM   Specimen: BLOOD  Result Value Ref Range Status   Specimen Description BLOOD LAC  Final   Special Requests   Final    BOTTLES DRAWN AEROBIC AND ANAEROBIC Blood Culture adequate volume    Culture   Final    NO GROWTH 5 DAYS Performed at [facility], [address] [place], [place] [address]    Report Status 12/23/2019 FINAL  Final  Respiratory Panel by RT PCR (Flu A&B, Covid) - Urine, Clean Catch     Status: None   Collection Time: 12/18/19 12:52 PM   Specimen: Urine, Clean Catch  Result Value Ref Range Status   SARS Coronavirus 2 by RT PCR NEGATIVE NEGATIVE Final    Comment: (NOTE) SARS-CoV-2 target nucleic acids are NOT DETECTED.  The SARS-CoV-2 RNA is generally detectable in upper respiratoy specimens during the acute phase of infection. The lowest concentration of SARS-CoV-2 viral copies this assay can detect is 131 copies/mL. A negative result does not preclude SARS-Cov-2 infection and should not be used as the sole basis for treatment or other patient management decisions. A negative result may occur with  improper specimen collection/handling, submission of specimen other than nasopharyngeal swab, presence of viral mutation(s) within the areas targeted by this assay, and inadequate number of viral copies (<131 copies/mL). A negative result must be combined with clinical observations, patient history, and epidemiological information. The expected result is Negative.  Fact Sheet for Patients:  PinkCheek.be  Fact Sheet for Healthcare Providers:  GravelBags.it  This test is no t yet approved or cleared by the [place] FDA and  has been authorized for detection and/or diagnosis of SARS-CoV-2 by FDA under an Emergency Use Authorization (EUA). This EUA will remain  in effect (meaning this test can be used) for the duration of the COVID-19 declaration under Section 564(b)(1) of the Act, 21 U.S.C. section 360bbb-3(b)(1), unless the authorization is terminated or revoked sooner.  Influenza A by PCR NEGATIVE NEGATIVE Final   Influenza B by PCR NEGATIVE NEGATIVE Final    Comment:  (NOTE) The Xpert Xpress SARS-CoV-2/FLU/RSV assay is intended as an aid in  the diagnosis of influenza from Nasopharyngeal swab specimens and  should not be used as a sole basis for treatment. Nasal washings and  aspirates are unacceptable for Xpert Xpress SARS-CoV-2/FLU/RSV  testing.  Fact Sheet for Patients: PinkCheek.be  Fact Sheet for Healthcare Providers: GravelBags.it  This test is not yet approved or cleared by the [place] FDA and  has been authorized for detection and/or diagnosis of SARS-CoV-2 by  FDA under an Emergency Use Authorization (EUA). This EUA will remain  in effect (meaning this test can be used) for the duration of the  Covid-19 declaration under Section 564(b)(1) of the Act, 21  U.S.C. section 360bbb-3(b)(1), unless the authorization is  terminated or revoked. Performed at [facility], [address] [place], [place] [address]   Urine culture     Status: Abnormal   Collection Time: 12/18/19 12:52 PM   Specimen: Urine, Random  Result Value Ref Range Status   Specimen Description   Final    URINE, RANDOM Performed at [facility], [address] [place], [place] [address]    Special Requests   Final    NONE Performed at [facility], [address] [place], [place] [address]    Culture >=100,000 COLONIES/mL ESCHERICHIA COLI (A)  Final   Report Status 12/21/2019 FINAL  Final   Organism ID, Bacteria ESCHERICHIA COLI (A)  Final      Susceptibility   Escherichia coli - MIC*    AMPICILLIN <=2 SENSITIVE Sensitive     CEFAZOLIN <=4 SENSITIVE Sensitive     CEFTRIAXONE <=0.25 SENSITIVE Sensitive     CIPROFLOXACIN <=0.25 SENSITIVE Sensitive     GENTAMICIN <=1 SENSITIVE Sensitive     IMIPENEM 0.5 SENSITIVE Sensitive     NITROFURANTOIN <=16 SENSITIVE Sensitive     TRIMETH/SULFA <=20 SENSITIVE Sensitive     AMPICILLIN/SULBACTAM <=2 SENSITIVE Sensitive     PIP/TAZO  <=4 SENSITIVE Sensitive     * >=100,000 COLONIES/mL ESCHERICHIA COLI  Blood culture (routine x 2)     Status: None   Collection Time: 12/18/19 12:53 PM   Specimen: BLOOD  Result Value Ref Range Status   Specimen Description BLOOD RIGHT HAND  Final   Special Requests   Final    BOTTLES DRAWN AEROBIC AND ANAEROBIC Blood Culture adequate volume   Culture   Final    NO GROWTH 5 DAYS Performed at [facility], [address], [place], [place] [address]    Report Status 12/23/2019 FINAL  Final     Time coordinating discharge: Over 30 minutes  SIGNED:   Ezekiel Slocumb, DO [facility] 12/28/2019, 10:37 AM   If 7PM-7AM, please contact night-coverage [email or web address]

## 2019-12-28 NOTE — Telephone Encounter (Signed)
I spoke with the patient's daughter today and she has been moved to hospice, she stated that she was not able to recover and wanted to thank Dr. Mena Goes for what he did for her while she was in the hospital.   Amanda Hancock

## 2019-12-28 NOTE — Progress Notes (Signed)
[facility] Room 213 [facility]) Hospital Liaison RN note:  Visited with patient and daughter in room. Patient is resting in bed but anxious to get out of the bed. Haldol given PRN by bedside RN.   [facility] does have a bed today and transport is scheduled for 11:30am. Discharge summary has been faxed.  Thank you for the opportunity to participate in this patient's care.  Cyndra Numbers, RN [facility] Liaison [phone]

## 2019-12-28 NOTE — Telephone Encounter (Signed)
-----   Message from Matthew Eskridge, MD sent at 12/19/2019 10:15 AM EDT ----- I need to see Ms Dea in office next couple of weeks for a right renal stone -- thanks!   

## 2019-12-28 NOTE — TOC Transition Note (Signed)
Transition of Care [facility]) - CM/SW Discharge Note   Patient Details  Name: Amanda Hancock MRN: [id number] Date of Birth: 06-07-1945  Transition of Care [facility]) CM/SW Contact:  Margarito Liner, LCSW Phone Number: 12/28/2019, 10:15 AM   Clinical Narrative: Patient has orders to discharge to the [facility] today. Hospice liaison requested transport be arranged through [facility] for 11:30 am. Transport arranged. Hospice liaison will call report. No further concerns. CSW signing off.    Final next level of care: Hospice Medical Facility Barriers to Discharge: Barriers Resolved   Patient Goals and CMS Choice Patient states their goals for this hospitalization and ongoing recovery are:: Patient not fully oriented. CMS Medicare.gov Compare Post Acute Care list provided to:: Patient Represenative (must comment) (Daughter) Choice offered to / list presented to : Adult Children  Discharge Placement                Patient to be transferred to facility by: [facility]   Patient and family notified of of transfer: 12/28/19  Discharge Plan and Services     Post Acute Care Choice: Skilled Nursing Facility                               Social Determinants of Health (SDOH) Interventions     Readmission Risk Interventions No flowsheet data found.

## 2019-12-31 ENCOUNTER — Encounter: Payer: Self-pay | Admitting: Urology

## 2019-12-31 ENCOUNTER — Ambulatory Visit: Payer: Self-pay | Admitting: Urology

## 2020-01-17 DEATH — deceased
